# Patient Record
Sex: Male | Born: 1960 | Race: White | Hispanic: No | Marital: Single | Smoking: Current every day smoker
Health system: Southern US, Community
[De-identification: ages and names within clinical notes are randomized; demographics above are authoritative.]

## PROBLEM LIST (undated history)

## (undated) DIAGNOSIS — I10 Essential (primary) hypertension: Secondary | ICD-10-CM

## (undated) DIAGNOSIS — M542 Cervicalgia: Secondary | ICD-10-CM

## (undated) DIAGNOSIS — F419 Anxiety disorder, unspecified: Secondary | ICD-10-CM

## (undated) DIAGNOSIS — M1990 Unspecified osteoarthritis, unspecified site: Secondary | ICD-10-CM

## (undated) DIAGNOSIS — B1920 Unspecified viral hepatitis C without hepatic coma: Secondary | ICD-10-CM

## (undated) DIAGNOSIS — R0600 Dyspnea, unspecified: Secondary | ICD-10-CM

## (undated) DIAGNOSIS — F32A Depression, unspecified: Secondary | ICD-10-CM

## (undated) DIAGNOSIS — E7800 Pure hypercholesterolemia, unspecified: Secondary | ICD-10-CM

## (undated) DIAGNOSIS — M25519 Pain in unspecified shoulder: Secondary | ICD-10-CM

## (undated) DIAGNOSIS — Y92009 Unspecified place in unspecified non-institutional (private) residence as the place of occurrence of the external cause: Secondary | ICD-10-CM

## (undated) DIAGNOSIS — K219 Gastro-esophageal reflux disease without esophagitis: Secondary | ICD-10-CM

## (undated) DIAGNOSIS — Z889 Allergy status to unspecified drugs, medicaments and biological substances status: Secondary | ICD-10-CM

## (undated) DIAGNOSIS — G894 Chronic pain syndrome: Secondary | ICD-10-CM

## (undated) DIAGNOSIS — F329 Major depressive disorder, single episode, unspecified: Secondary | ICD-10-CM

## (undated) DIAGNOSIS — I2510 Atherosclerotic heart disease of native coronary artery without angina pectoris: Secondary | ICD-10-CM

## (undated) DIAGNOSIS — R296 Repeated falls: Secondary | ICD-10-CM

## (undated) DIAGNOSIS — M549 Dorsalgia, unspecified: Secondary | ICD-10-CM

## (undated) DIAGNOSIS — Z87442 Personal history of urinary calculi: Secondary | ICD-10-CM

## (undated) DIAGNOSIS — F4310 Post-traumatic stress disorder, unspecified: Secondary | ICD-10-CM

## (undated) DIAGNOSIS — J449 Chronic obstructive pulmonary disease, unspecified: Secondary | ICD-10-CM

## (undated) DIAGNOSIS — G8929 Other chronic pain: Secondary | ICD-10-CM

## (undated) DIAGNOSIS — F319 Bipolar disorder, unspecified: Secondary | ICD-10-CM

## (undated) DIAGNOSIS — W19XXXA Unspecified fall, initial encounter: Secondary | ICD-10-CM

## (undated) HISTORY — DX: Chronic obstructive pulmonary disease, unspecified: J44.9

## (undated) HISTORY — PX: HAND SURGERY: SHX662

## (undated) HISTORY — PX: OTHER SURGICAL HISTORY: SHX169

## (undated) HISTORY — DX: Unspecified viral hepatitis C without hepatic coma: B19.20

## (undated) HISTORY — PX: KNEE SURGERY: SHX244

## (undated) HISTORY — PX: SHOULDER SURGERY: SHX246

## (undated) HISTORY — PX: TONSILLECTOMY: SUR1361

## (undated) HISTORY — PX: THROAT SURGERY: SHX803

---

## 1998-07-14 ENCOUNTER — Encounter: Payer: Self-pay | Admitting: Emergency Medicine

## 1998-07-15 ENCOUNTER — Encounter: Payer: Self-pay | Admitting: Orthopedic Surgery

## 1998-07-15 ENCOUNTER — Encounter: Payer: Self-pay | Admitting: Emergency Medicine

## 1998-07-15 ENCOUNTER — Inpatient Hospital Stay (HOSPITAL_COMMUNITY): Admission: EM | Admit: 1998-07-15 | Discharge: 1998-07-18 | Payer: Self-pay | Admitting: Emergency Medicine

## 1998-07-24 ENCOUNTER — Encounter: Payer: Self-pay | Admitting: Emergency Medicine

## 1998-07-24 ENCOUNTER — Emergency Department (HOSPITAL_COMMUNITY): Admission: EM | Admit: 1998-07-24 | Discharge: 1998-07-24 | Payer: Self-pay | Admitting: Emergency Medicine

## 2006-09-27 ENCOUNTER — Ambulatory Visit: Payer: Self-pay | Admitting: Internal Medicine

## 2006-09-28 ENCOUNTER — Ambulatory Visit (HOSPITAL_COMMUNITY): Admission: RE | Admit: 2006-09-28 | Discharge: 2006-09-28 | Payer: Self-pay | Admitting: Internal Medicine

## 2009-12-14 ENCOUNTER — Emergency Department (HOSPITAL_COMMUNITY): Admission: EM | Admit: 2009-12-14 | Discharge: 2009-12-14 | Payer: Self-pay | Admitting: Emergency Medicine

## 2010-06-05 LAB — POCT I-STAT, CHEM 8
Chloride: 109 mEq/L (ref 96–112)
Creatinine, Ser: 1.3 mg/dL (ref 0.4–1.5)
Glucose, Bld: 124 mg/dL — ABNORMAL HIGH (ref 70–99)
Potassium: 3.6 mEq/L (ref 3.5–5.1)

## 2010-06-05 LAB — TYPE AND SCREEN: ABO/RH(D): O POS

## 2010-06-05 LAB — CBC
HCT: 46.3 % (ref 39.0–52.0)
MCHC: 36.1 g/dL — ABNORMAL HIGH (ref 30.0–36.0)
Platelets: 213 10*3/uL (ref 150–400)
RDW: 12.3 % (ref 11.5–15.5)
WBC: 13.3 10*3/uL — ABNORMAL HIGH (ref 4.0–10.5)

## 2010-06-05 LAB — COMPREHENSIVE METABOLIC PANEL
ALT: 75 U/L — ABNORMAL HIGH (ref 0–53)
Albumin: 4 g/dL (ref 3.5–5.2)
Calcium: 9.1 mg/dL (ref 8.4–10.5)
GFR calc Af Amer: >60 mL/min (ref >60)
Glucose, Bld: 121 mg/dL — ABNORMAL HIGH (ref 70–99)
Sodium: 140 mEq/L (ref 135–145)
Total Protein: 7.4 g/dL (ref 6.0–8.3)

## 2010-06-05 LAB — PROTIME-INR: Prothrombin Time: 12.2 seconds (ref 11.6–15.2)

## 2010-06-05 LAB — LACTIC ACID, PLASMA: Lactic Acid, Venous: 2.1 mmol/L (ref 0.5–2.2)

## 2010-06-05 LAB — ETHANOL: Alcohol, Ethyl (B): 201 mg/dL — ABNORMAL HIGH (ref 0–10)

## 2012-08-03 ENCOUNTER — Encounter (HOSPITAL_COMMUNITY): Payer: Self-pay

## 2012-08-03 ENCOUNTER — Emergency Department (HOSPITAL_COMMUNITY)
Admission: EM | Admit: 2012-08-03 | Discharge: 2012-08-03 | Disposition: A | Discharge status: Home / Self Care | Payer: Self-pay | Attending: Emergency Medicine | Admitting: Emergency Medicine

## 2012-08-03 ENCOUNTER — Emergency Department (HOSPITAL_COMMUNITY): Payer: Self-pay

## 2012-08-03 DIAGNOSIS — K769 Liver disease, unspecified: Secondary | ICD-10-CM | POA: Insufficient documentation

## 2012-08-03 DIAGNOSIS — R6883 Chills (without fever): Secondary | ICD-10-CM | POA: Insufficient documentation

## 2012-08-03 DIAGNOSIS — IMO0001 Reserved for inherently not codable concepts without codable children: Secondary | ICD-10-CM | POA: Insufficient documentation

## 2012-08-03 DIAGNOSIS — K5289 Other specified noninfective gastroenteritis and colitis: Secondary | ICD-10-CM | POA: Insufficient documentation

## 2012-08-03 DIAGNOSIS — R51 Headache: Secondary | ICD-10-CM | POA: Insufficient documentation

## 2012-08-03 DIAGNOSIS — F17200 Nicotine dependence, unspecified, uncomplicated: Secondary | ICD-10-CM | POA: Insufficient documentation

## 2012-08-03 DIAGNOSIS — I2510 Atherosclerotic heart disease of native coronary artery without angina pectoris: Secondary | ICD-10-CM | POA: Insufficient documentation

## 2012-08-03 DIAGNOSIS — K529 Noninfective gastroenteritis and colitis, unspecified: Secondary | ICD-10-CM

## 2012-08-03 DIAGNOSIS — Z8659 Personal history of other mental and behavioral disorders: Secondary | ICD-10-CM | POA: Insufficient documentation

## 2012-08-03 DIAGNOSIS — R109 Unspecified abdominal pain: Secondary | ICD-10-CM | POA: Insufficient documentation

## 2012-08-03 DIAGNOSIS — R1110 Vomiting, unspecified: Secondary | ICD-10-CM | POA: Insufficient documentation

## 2012-08-03 HISTORY — DX: Post-traumatic stress disorder, unspecified: F43.10

## 2012-08-03 LAB — BASIC METABOLIC PANEL
BUN: 12 mg/dL (ref 6–23)
Chloride: 98 mEq/L (ref 96–112)
Creatinine, Ser: 0.9 mg/dL (ref 0.50–1.35)
GFR calc Af Amer: >90 mL/min (ref >90)
GFR calc non Af Amer: >90 mL/min (ref >90)

## 2012-08-03 LAB — CBC WITH DIFFERENTIAL/PLATELET
Basophils Relative: 0 % (ref 0–1)
Eosinophils Absolute: 0.1 10*3/uL (ref 0.0–0.7)
HCT: 49.7 % (ref 39.0–52.0)
Hemoglobin: 18.7 g/dL — ABNORMAL HIGH (ref 13.0–17.0)
MCH: 33.6 pg (ref 26.0–34.0)
MCHC: 37.6 g/dL — ABNORMAL HIGH (ref 30.0–36.0)
MCV: 89.2 fL (ref 78.0–100.0)
Monocytes Absolute: 0.8 10*3/uL (ref 0.1–1.0)
Monocytes Relative: 6 % (ref 3–12)

## 2012-08-03 LAB — URINALYSIS, ROUTINE W REFLEX MICROSCOPIC
Bilirubin Urine: NEGATIVE
Ketones, ur: NEGATIVE mg/dL
Nitrite: NEGATIVE
pH: 6 (ref 5.0–8.0)

## 2012-08-03 LAB — URINE MICROSCOPIC-ADD ON

## 2012-08-03 LAB — HEPATIC FUNCTION PANEL
AST: 53 U/L — ABNORMAL HIGH (ref 0–37)
Bilirubin, Direct: 0.2 mg/dL (ref 0.0–0.3)
Total Bilirubin: 0.8 mg/dL (ref 0.3–1.2)

## 2012-08-03 LAB — LIPASE, BLOOD: Lipase: 34 U/L (ref 11–59)

## 2012-08-03 MED ORDER — SODIUM CHLORIDE 0.9 % IV SOLN
1000.0000 mL | INTRAVENOUS | Status: DC
  Administered 2012-08-03: 1000 mL via INTRAVENOUS

## 2012-08-03 MED ORDER — PROMETHAZINE HCL 25 MG RE SUPP: 25.0000 mg | Freq: Four times a day (QID) | RECTAL | Status: DC | PRN

## 2012-08-03 MED ORDER — HYDROMORPHONE HCL PF 1 MG/ML IJ SOLN
0.5000 mg | Freq: Once | INTRAMUSCULAR | Status: AC
  Administered 2012-08-03: 0.5 mg via INTRAVENOUS
  Filled 2012-08-03: qty 1

## 2012-08-03 MED ORDER — SODIUM CHLORIDE 0.9 % IV SOLN
1000.0000 mL | Freq: Once | INTRAVENOUS | Status: AC
  Administered 2012-08-03: 1000 mL via INTRAVENOUS

## 2012-08-03 MED ORDER — IOHEXOL 300 MG/ML  SOLN
100.0000 mL | Freq: Once | INTRAMUSCULAR | Status: AC | PRN
  Administered 2012-08-03: 100 mL via INTRAVENOUS

## 2012-08-03 MED ORDER — IOHEXOL 300 MG/ML  SOLN
50.0000 mL | Freq: Once | INTRAMUSCULAR | Status: AC | PRN
  Administered 2012-08-03: 50 mL via ORAL

## 2012-08-03 MED ORDER — HYDROCODONE-ACETAMINOPHEN 5-325 MG PO TABS: ORAL_TABLET | ORAL | Status: DC

## 2012-08-03 MED ORDER — ONDANSETRON HCL 4 MG/2ML IJ SOLN
4.0000 mg | Freq: Once | INTRAMUSCULAR | Status: AC
  Administered 2012-08-03: 4 mg via INTRAVENOUS
  Filled 2012-08-03: qty 2

## 2012-08-03 NOTE — ED Notes (Signed)
Complain of " puking " since Monday. States he started getting sick about 0430 in the morning on Monday. States he feels some better today but is still aching and vomiting. States he had a fever earlier but not take anything for it because he does not keep stuff like that in the house.

## 2012-08-03 NOTE — ED Notes (Signed)
Pt c/o vomiting since Monday.  Denies diarrhea.  LBM was this am.   Also reports fever, generalized bodyaches, and weakness.    Reports hasn't taken anything for fever today.

## 2012-08-03 NOTE — ED Notes (Signed)
EDPA in with pt 
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
## 2012-08-06 NOTE — ED Provider Notes (Signed)
Medical screening examination/treatment/procedure(s) were performed by non-physician practitioner and as supervising physician I was immediately available for consultation/collaboration.   Maricella Filyaw M Lauralynn Loeb, DO 08/06/12 1059 

## 2012-10-19 ENCOUNTER — Emergency Department (HOSPITAL_COMMUNITY): Payer: Self-pay

## 2012-10-19 ENCOUNTER — Encounter (HOSPITAL_COMMUNITY): Payer: Self-pay | Admitting: Emergency Medicine

## 2012-10-19 ENCOUNTER — Emergency Department (HOSPITAL_COMMUNITY)
Admission: EM | Admit: 2012-10-19 | Discharge: 2012-10-19 | Disposition: A | Discharge status: Home / Self Care | Payer: Self-pay | Attending: Emergency Medicine | Admitting: Emergency Medicine

## 2012-10-19 DIAGNOSIS — I2510 Atherosclerotic heart disease of native coronary artery without angina pectoris: Secondary | ICD-10-CM | POA: Insufficient documentation

## 2012-10-19 DIAGNOSIS — F17200 Nicotine dependence, unspecified, uncomplicated: Secondary | ICD-10-CM | POA: Insufficient documentation

## 2012-10-19 DIAGNOSIS — Z8659 Personal history of other mental and behavioral disorders: Secondary | ICD-10-CM | POA: Insufficient documentation

## 2012-10-19 DIAGNOSIS — Z8639 Personal history of other endocrine, nutritional and metabolic disease: Secondary | ICD-10-CM | POA: Insufficient documentation

## 2012-10-19 DIAGNOSIS — R112 Nausea with vomiting, unspecified: Secondary | ICD-10-CM | POA: Insufficient documentation

## 2012-10-19 DIAGNOSIS — Z880 Allergy status to penicillin: Secondary | ICD-10-CM | POA: Insufficient documentation

## 2012-10-19 DIAGNOSIS — R109 Unspecified abdominal pain: Secondary | ICD-10-CM | POA: Insufficient documentation

## 2012-10-19 DIAGNOSIS — Z862 Personal history of diseases of the blood and blood-forming organs and certain disorders involving the immune mechanism: Secondary | ICD-10-CM | POA: Insufficient documentation

## 2012-10-19 HISTORY — DX: Pure hypercholesterolemia, unspecified: E78.00

## 2012-10-19 HISTORY — DX: Atherosclerotic heart disease of native coronary artery without angina pectoris: I25.10

## 2012-10-19 LAB — CBC WITH DIFFERENTIAL/PLATELET
Basophils Absolute: 0 10*3/uL (ref 0.0–0.1)
Basophils Relative: 0 % (ref 0–1)
Eosinophils Absolute: 0.3 10*3/uL (ref 0.0–0.7)
Eosinophils Relative: 3 % (ref 0–5)
Lymphs Abs: 2.1 10*3/uL (ref 0.7–4.0)
MCH: 33.8 pg (ref 26.0–34.0)
MCHC: 36.8 g/dL — ABNORMAL HIGH (ref 30.0–36.0)
MCV: 91.9 fL (ref 78.0–100.0)
Neutrophils Relative %: 74 % (ref 43–77)
Platelets: 185 10*3/uL (ref 150–400)
RDW: 12.2 % (ref 11.5–15.5)

## 2012-10-19 LAB — COMPREHENSIVE METABOLIC PANEL
ALT: 35 U/L (ref 0–53)
Albumin: 3.9 g/dL (ref 3.5–5.2)
Calcium: 9.4 mg/dL (ref 8.4–10.5)
GFR calc Af Amer: >90 mL/min (ref >90)
Glucose, Bld: 124 mg/dL — ABNORMAL HIGH (ref 70–99)
Sodium: 134 mEq/L — ABNORMAL LOW (ref 135–145)
Total Protein: 7.9 g/dL (ref 6.0–8.3)

## 2012-10-19 LAB — URINALYSIS, ROUTINE W REFLEX MICROSCOPIC
Bilirubin Urine: NEGATIVE
Ketones, ur: NEGATIVE mg/dL
Nitrite: NEGATIVE
Protein, ur: NEGATIVE mg/dL
Urobilinogen, UA: 0.2 mg/dL (ref 0.0–1.0)

## 2012-10-19 LAB — URINE MICROSCOPIC-ADD ON

## 2012-10-19 LAB — PROTIME-INR: INR: 0.98 (ref 0.00–1.49)

## 2012-10-19 LAB — APTT: aPTT: 28 seconds (ref 24–37)

## 2012-10-19 MED ORDER — PROMETHAZINE HCL 25 MG RE SUPP: 25.0000 mg | Freq: Four times a day (QID) | RECTAL | Status: DC | PRN

## 2012-10-19 MED ORDER — MORPHINE SULFATE 4 MG/ML IJ SOLN
4.0000 mg | INTRAMUSCULAR | Status: DC | PRN
  Administered 2012-10-19: 4 mg via INTRAVENOUS
  Filled 2012-10-19: qty 1

## 2012-10-19 MED ORDER — HYDROMORPHONE HCL PF 1 MG/ML IJ SOLN: 1.0000 mg | Freq: Once | INTRAMUSCULAR | Status: DC

## 2012-10-19 MED ORDER — IOHEXOL 300 MG/ML  SOLN
100.0000 mL | Freq: Once | INTRAMUSCULAR | Status: AC | PRN
  Administered 2012-10-19: 100 mL via INTRAVENOUS

## 2012-10-19 MED ORDER — IOHEXOL 300 MG/ML  SOLN: 50.0000 mL | Freq: Once | INTRAMUSCULAR | Status: DC | PRN

## 2012-10-19 MED ORDER — HYDROCODONE-ACETAMINOPHEN 5-325 MG PO TABS: 1.0000 | ORAL_TABLET | Freq: Four times a day (QID) | ORAL | Status: DC | PRN

## 2012-10-19 MED ORDER — ONDANSETRON 8 MG PO TBDP: 8.0000 mg | ORAL_TABLET | Freq: Three times a day (TID) | ORAL | Status: DC | PRN

## 2012-10-19 MED ORDER — SODIUM CHLORIDE 0.9 % IV BOLUS (SEPSIS)
1000.0000 mL | Freq: Once | INTRAVENOUS | Status: AC
  Administered 2012-10-19: 1000 mL via INTRAVENOUS

## 2012-10-19 MED ORDER — ONDANSETRON HCL 4 MG/2ML IJ SOLN
4.0000 mg | Freq: Four times a day (QID) | INTRAMUSCULAR | Status: DC | PRN
  Administered 2012-10-19: 4 mg via INTRAVENOUS
  Filled 2012-10-19: qty 2

## 2012-10-19 MED ORDER — IOHEXOL 300 MG/ML  SOLN
50.0000 mL | Freq: Once | INTRAMUSCULAR | Status: AC | PRN
  Administered 2012-10-19: 50 mL via ORAL

## 2012-10-19 MED ORDER — MORPHINE SULFATE 4 MG/ML IJ SOLN
4.0000 mg | Freq: Once | INTRAMUSCULAR | Status: AC
  Administered 2012-10-19: 4 mg via INTRAVENOUS
  Filled 2012-10-19: qty 1

## 2012-10-19 MED ORDER — HYDROMORPHONE HCL PF 1 MG/ML IJ SOLN
1.0000 mg | INTRAMUSCULAR | Status: DC | PRN
  Administered 2012-10-19: 1 mg via INTRAVENOUS
  Filled 2012-10-19: qty 1
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
## 2012-10-19 NOTE — ED Notes (Signed)
Pt called stating he cannot afford Zofran rx nor Phenergan suppository rx.  Per EDP - Rx for Phenergan 25 mg PO, 1 tab every 6 hrs PRN nausea, quantity 20 called in to [facility] in [place].  Pt called back and notified of rx change.

## 2012-10-19 NOTE — ED Notes (Signed)
Pt c/o n/v and left lower abd pain x 10 days. Denies diarrhea. Vomited approx 6 times in past 24 hrs. Pt states nausea worse in am. Intermittent pains to ruq. Dizziness with movement x8 days ago. Mm slightly moist. lnbm yesterday. Denies black or bloody emesis/stool.

## 2012-11-14 ENCOUNTER — Ambulatory Visit: Payer: Self-pay

## 2012-11-14 ENCOUNTER — Ambulatory Visit: Payer: Self-pay | Attending: Family Medicine | Admitting: Internal Medicine

## 2012-11-14 VITALS — BP 166/106 | HR 72 | Temp 97.7°F | Resp 16 | Wt 255.0 lb

## 2012-11-14 DIAGNOSIS — M25519 Pain in unspecified shoulder: Secondary | ICD-10-CM | POA: Insufficient documentation

## 2012-11-14 DIAGNOSIS — G894 Chronic pain syndrome: Secondary | ICD-10-CM

## 2012-11-14 DIAGNOSIS — M549 Dorsalgia, unspecified: Secondary | ICD-10-CM | POA: Insufficient documentation

## 2012-11-14 MED ORDER — PREDNISONE 20 MG PO TABS: 40.0000 mg | ORAL_TABLET | Freq: Every day | ORAL | Status: DC

## 2012-11-14 MED ORDER — METHOCARBAMOL 500 MG PO TABS: 500.0000 mg | ORAL_TABLET | Freq: Three times a day (TID) | ORAL | Status: DC
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
## 2012-11-14 NOTE — Progress Notes (Signed)
Patient here to establish care Suffers from ptsd

## 2012-11-15 ENCOUNTER — Ambulatory Visit (HOSPITAL_COMMUNITY)
Admission: RE | Admit: 2012-11-15 | Discharge: 2012-11-15 | Disposition: A | Discharge status: Home / Self Care | Payer: Self-pay | Source: Ambulatory Visit | Attending: Internal Medicine | Admitting: Internal Medicine

## 2012-11-15 DIAGNOSIS — G894 Chronic pain syndrome: Secondary | ICD-10-CM

## 2012-11-15 DIAGNOSIS — M545 Low back pain, unspecified: Secondary | ICD-10-CM | POA: Insufficient documentation

## 2012-11-15 DIAGNOSIS — M25519 Pain in unspecified shoulder: Secondary | ICD-10-CM | POA: Insufficient documentation

## 2012-11-15 DIAGNOSIS — M79609 Pain in unspecified limb: Secondary | ICD-10-CM | POA: Insufficient documentation

## 2012-12-12 ENCOUNTER — Telehealth: Payer: Self-pay | Admitting: Family Medicine

## 2012-12-12 NOTE — Telephone Encounter (Signed)
Pt calling about results for x-ray done on 11/15/12.

## 2012-12-15 ENCOUNTER — Telehealth: Payer: Self-pay | Admitting: Emergency Medicine

## 2012-12-15 NOTE — Telephone Encounter (Signed)
Spoke with pt regarding xray results. Scheduled f/u appt to discuss pain management 12/30/12 @ 12 pm

## 2012-12-30 ENCOUNTER — Ambulatory Visit: Payer: Self-pay | Attending: Internal Medicine | Admitting: Internal Medicine

## 2012-12-30 ENCOUNTER — Encounter: Payer: Self-pay | Admitting: Internal Medicine

## 2012-12-30 VITALS — BP 130/76 | HR 89 | Temp 97.9°F | Resp 18 | Wt 266.0 lb

## 2012-12-30 DIAGNOSIS — R52 Pain, unspecified: Secondary | ICD-10-CM | POA: Insufficient documentation

## 2012-12-30 DIAGNOSIS — G894 Chronic pain syndrome: Secondary | ICD-10-CM

## 2012-12-30 MED ORDER — TRAMADOL HCL 50 MG PO TABS: 50.0000 mg | ORAL_TABLET | Freq: Three times a day (TID) | ORAL | Status: DC | PRN

## 2012-12-30 NOTE — Patient Instructions (Signed)
Osteoarthritis Osteoarthritis is the most common form of arthritis. It is redness, soreness, and swelling (inflammation) affecting the cartilage. Cartilage acts as a cushion, covering the ends of bones where they meet to form a joint. CAUSES  Over time, the cartilage begins to wear away. This causes bone to rub on bone. This produces pain and stiffness in the affected joints. Factors that contribute to this problem are:  Excessive body weight.  Age.  Overuse of joints. SYMPTOMS   People with osteoarthritis usually experience joint pain, swelling, or stiffness.  Over time, the joint may lose its normal shape.  Small deposits of bone (osteophytes) may grow on the edges of the joint.  Bits of bone or cartilage can break off and float inside the joint space. This may cause more pain and damage.  Osteoarthritis can lead to depression, anxiety, feelings of helplessness, and limitations on daily activities. The most commonly affected joints are in the:  Ends of the fingers.  Thumbs.  Neck.  Lower back.  Knees.  Hips. DIAGNOSIS  Diagnosis is mostly based on your symptoms and exam. Tests may be helpful, including:  X-rays of the affected joint.  A computerized magnetic scan (MRI).  Blood tests to rule out other types of arthritis.  Joint fluid tests. This involves using a needle to draw fluid from the joint and examining the fluid under a microscope. TREATMENT  Goals of treatment are to control pain, improve joint function, maintain a normal body weight, and maintain a healthy lifestyle. Treatment approaches may include:  A prescribed exercise program with rest and joint relief.  Weight control with nutritional education.  Pain relief techniques such as:  Properly applied heat and cold.  Electric pulses delivered to nerve endings under the skin (transcutaneous electrical nerve stimulation, TENS).  Massage.  Certain supplements. Ask your caregiver before using any  supplements, especially in combination with prescribed drugs.  Medicines to control pain, such as:  Acetaminophen.  Nonsteroidal anti-inflammatory drugs (NSAIDs), such as naproxen.  Narcotic or central-acting agents, such as tramadol. This drug carries a risk of addiction and is generally prescribed for short-term use.  Corticosteroids. These can be given orally or as injection. This is a short-term treatment, not recommended for routine use.  Surgery to reposition the bones and relieve pain (osteotomy) or to remove loose pieces of bone and cartilage. Joint replacement may be needed in advanced states of osteoarthritis. HOME CARE INSTRUCTIONS  Your caregiver can recommend specific types of exercise. These may include:  Strengthening exercises. These are done to strengthen the muscles that support joints affected by arthritis. They can be performed with weights or with exercise bands to add resistance.  Aerobic activities. These are exercises, such as brisk walking or low-impact aerobics, that get your heart pumping. They can help keep your lungs and circulatory system in shape.  Range-of-motion activities. These keep your joints limber.  Balance and agility exercises. These help you maintain daily living skills. Learning about your condition and being actively involved in your care will help improve the course of your osteoarthritis. SEEK MEDICAL CARE IF:   You feel hot or your skin turns red.  You develop a rash in addition to your joint pain.  You have an oral temperature above 102 F (38.9 C). FOR MORE INFORMATION  National Institute of Arthritis and Musculoskeletal and Skin Diseases: www.niams.nih.gov National Institute on Aging: www.nia.nih.gov American College of Rheumatology: www.rheumatology.org Document Released: 03/09/2005 Document Revised: 06/01/2011 Document Reviewed: 06/20/2009 [employer] Patient Information 2014 [employer].  

## 2012-12-30 NOTE — Progress Notes (Signed)
Pt is here for a f/u and needing recently x-ray results Reports he is in constant pain that increases w/activity Voices no new concerns... Alert w/no signs of acute distress.
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
## 2013-01-06 ENCOUNTER — Telehealth: Payer: Self-pay | Admitting: Emergency Medicine

## 2013-01-06 NOTE — Telephone Encounter (Signed)
Spoke with pt sister regarding denied pain clinic. Gave pt number for [facility] pain clinic

## 2013-02-07 DIAGNOSIS — M19019 Primary osteoarthritis, unspecified shoulder: Secondary | ICD-10-CM | POA: Insufficient documentation

## 2013-02-07 DIAGNOSIS — M1990 Unspecified osteoarthritis, unspecified site: Secondary | ICD-10-CM | POA: Insufficient documentation

## 2013-02-09 ENCOUNTER — Encounter (INDEPENDENT_AMBULATORY_CARE_PROVIDER_SITE_OTHER): Payer: Self-pay

## 2013-02-09 ENCOUNTER — Encounter (HOSPITAL_COMMUNITY): Payer: Self-pay | Admitting: Psychiatry

## 2013-02-09 ENCOUNTER — Ambulatory Visit (INDEPENDENT_AMBULATORY_CARE_PROVIDER_SITE_OTHER): Payer: Self-pay | Admitting: Psychiatry

## 2013-02-09 VITALS — BP 137/91 | HR 105 | Ht 70.5 in | Wt 284.0 lb

## 2013-02-09 DIAGNOSIS — F3113 Bipolar disorder, current episode manic without psychotic features, severe: Secondary | ICD-10-CM

## 2013-02-09 MED ORDER — LAMOTRIGINE 25 MG PO TABS: ORAL_TABLET | ORAL | Status: DC

## 2013-02-09 MED ORDER — LAMOTRIGINE 25 MG PO TABS: 25.0000 mg | ORAL_TABLET | Freq: Every day | ORAL | Status: DC
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
## 2013-02-13 ENCOUNTER — Ambulatory Visit: Payer: Self-pay | Attending: Internal Medicine | Admitting: Internal Medicine

## 2013-02-13 ENCOUNTER — Telehealth (HOSPITAL_COMMUNITY): Payer: Self-pay

## 2013-02-13 VITALS — BP 159/96 | HR 89 | Temp 98.6°F | Ht 71.0 in | Wt 274.8 lb

## 2013-02-13 DIAGNOSIS — G894 Chronic pain syndrome: Secondary | ICD-10-CM | POA: Insufficient documentation

## 2013-02-13 DIAGNOSIS — F3113 Bipolar disorder, current episode manic without psychotic features, severe: Secondary | ICD-10-CM | POA: Insufficient documentation

## 2013-02-13 DIAGNOSIS — E785 Hyperlipidemia, unspecified: Secondary | ICD-10-CM | POA: Insufficient documentation

## 2013-02-13 DIAGNOSIS — Z23 Encounter for immunization: Secondary | ICD-10-CM

## 2013-02-13 LAB — COMPLETE METABOLIC PANEL WITH GFR
ALT: 93 U/L — ABNORMAL HIGH (ref 0–53)
Albumin: 4.7 g/dL (ref 3.5–5.2)
Alkaline Phosphatase: 78 U/L (ref 39–117)
CO2: 24 mEq/L (ref 19–32)
Calcium: 9.4 mg/dL (ref 8.4–10.5)
Chloride: 101 mEq/L (ref 96–112)
GFR, Est African American: >89 mL/min
GFR, Est Non African American: >89 mL/min
Glucose, Bld: 91 mg/dL (ref 70–99)
Potassium: 4.1 mEq/L (ref 3.5–5.3)
Sodium: 139 mEq/L (ref 135–145)
Total Bilirubin: 0.6 mg/dL (ref 0.3–1.2)
Total Protein: 8.2 g/dL (ref 6.0–8.3)

## 2013-02-13 LAB — SEDIMENTATION RATE: Sed Rate: 5 mm/hr (ref 0–16)

## 2013-02-13 LAB — LIPID PANEL
HDL: 36 mg/dL — ABNORMAL LOW (ref >39)
LDL Cholesterol: 95 mg/dL (ref 0–99)
Total CHOL/HDL Ratio: 4.7 Ratio
Triglycerides: 189 mg/dL — ABNORMAL HIGH (ref <150)

## 2013-02-13 MED ORDER — TRAMADOL HCL 50 MG PO TABS: 50.0000 mg | ORAL_TABLET | Freq: Three times a day (TID) | ORAL | Status: DC | PRN

## 2013-02-13 MED ORDER — LAMOTRIGINE 25 MG PO TABS: ORAL_TABLET | ORAL | Status: DC

## 2013-02-13 NOTE — Telephone Encounter (Signed)
Provided prescription to be faxed to front office staff.

## 2013-02-13 NOTE — Progress Notes (Signed)
  Subjective:    Patient ID: Oscar Hall, male    DOB: 1960/11/25, 52 y.o.   MRN: [id number]  HPI    Review of Systems     Objective:   Physical Exam        Assessment & Plan:

## 2013-02-13 NOTE — Progress Notes (Signed)
Pt is here for medication refill and a referral for a rheumatologist from a pain clinic.
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
## 2013-02-14 LAB — ANA: Anti Nuclear Antibody(ANA): NEGATIVE

## 2013-02-27 ENCOUNTER — Telehealth (HOSPITAL_COMMUNITY): Payer: Self-pay

## 2013-02-27 NOTE — Telephone Encounter (Signed)
Called patient. Asked him to increase lamictal to 50 mg.

## 2013-03-03 ENCOUNTER — Ambulatory Visit: Payer: Self-pay | Admitting: Family Medicine

## 2013-03-07 ENCOUNTER — Telehealth (HOSPITAL_COMMUNITY): Payer: Self-pay

## 2013-03-10 NOTE — Telephone Encounter (Signed)
First attempt to call patient. Left message that I will call again after 5 PM.

## 2013-03-10 NOTE — Telephone Encounter (Signed)
Second attempt to call patient. No answer, left message.

## 2013-03-10 NOTE — Telephone Encounter (Signed)
Called patient. No reported SI/HI/AVH. He is currently on 50 mg of Lamictal with no side effects. Will increase Lamictal to 75 mg daily.

## 2013-03-20 ENCOUNTER — Ambulatory Visit (INDEPENDENT_AMBULATORY_CARE_PROVIDER_SITE_OTHER): Payer: Self-pay | Admitting: Psychiatry

## 2013-03-20 ENCOUNTER — Encounter (HOSPITAL_COMMUNITY): Payer: Self-pay | Admitting: Psychiatry

## 2013-03-20 VITALS — BP 147/90 | HR 92 | Ht 71.0 in | Wt 282.0 lb

## 2013-03-20 DIAGNOSIS — F3113 Bipolar disorder, current episode manic without psychotic features, severe: Secondary | ICD-10-CM

## 2013-03-20 MED ORDER — HYDROXYZINE PAMOATE 25 MG PO CAPS: 25.0000 mg | ORAL_CAPSULE | Freq: Three times a day (TID) | ORAL | Status: DC | PRN

## 2013-03-20 MED ORDER — LAMOTRIGINE 25 MG PO TABS: 25.0000 mg | ORAL_TABLET | Freq: Every day | ORAL | Status: DC

## 2013-03-20 MED ORDER — LAMOTRIGINE 100 MG PO TABS: 100.0000 mg | ORAL_TABLET | Freq: Every day | ORAL | Status: DC
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
## 2013-04-03 ENCOUNTER — Telehealth (HOSPITAL_COMMUNITY): Payer: Self-pay | Admitting: Psychiatry

## 2013-04-03 NOTE — Telephone Encounter (Signed)
Patient reports that he has had a rash on his arms last Friday which was shortly after the increase of lamictal to 125 mg.    I asked the patient to go to the ER to have the rash examined to make sure it was not a Stevens-Johnson type rash and if so will stop Lamictal and try another medication.

## 2013-04-17 ENCOUNTER — Encounter: Payer: Self-pay | Admitting: Internal Medicine

## 2013-04-17 ENCOUNTER — Ambulatory Visit: Payer: Self-pay | Attending: Internal Medicine | Admitting: Internal Medicine

## 2013-04-17 VITALS — BP 159/96 | HR 94 | Temp 97.7°F | Resp 16 | Ht 71.0 in | Wt 281.0 lb

## 2013-04-17 DIAGNOSIS — G894 Chronic pain syndrome: Secondary | ICD-10-CM

## 2013-04-17 DIAGNOSIS — I10 Essential (primary) hypertension: Secondary | ICD-10-CM

## 2013-04-17 DIAGNOSIS — R7302 Impaired glucose tolerance (oral): Secondary | ICD-10-CM

## 2013-04-17 DIAGNOSIS — E781 Pure hyperglyceridemia: Secondary | ICD-10-CM

## 2013-04-17 MED ORDER — TRAMADOL HCL 50 MG PO TABS: 100.0000 mg | ORAL_TABLET | Freq: Four times a day (QID) | ORAL | Status: DC | PRN

## 2013-04-17 MED ORDER — HYDROCHLOROTHIAZIDE 25 MG PO TABS: 12.5000 mg | ORAL_TABLET | Freq: Every day | ORAL | Status: DC

## 2013-04-17 MED ORDER — EZETIMIBE-SIMVASTATIN 10-20 MG PO TABS: 1.0000 | ORAL_TABLET | Freq: Every day | ORAL | Status: DC

## 2013-04-17 NOTE — Patient Instructions (Signed)

## 2013-04-17 NOTE — Progress Notes (Unsigned)
Patient ID: Oscar Hall, male   DOB: 1961-02-16, 53 y.o.   MRN: [id number]   HPI: This is a 53 year old male who presents for followup. He continues to complain of pain in his left neck and shoulder which is not relieved with tramadol. He is unable to take NSAIDs due to resultant epigastric pain and heartburn. He continues to smoke and is currently not motivated to quit as he lives with his sister who smokes as well.     Medication List       This list is accurate as of: 04/17/13  1:29 PM.  Always use your most recent med list.               ezetimibe-simvastatin 10-20 MG per tablet  Commonly known as:  VYTORIN  Take 1 tablet by mouth daily.     hydrochlorothiazide 25 MG tablet  Commonly known as:  HYDRODIURIL  Take 0.5 tablets (12.5 mg total) by mouth daily.     traMADol 50 MG tablet  Commonly known as:  ULTRAM  Take 2 tablets (100 mg total) by mouth every 6 (six) hours as needed.         Allergies  Allergen Reactions  . Celebrex [Celecoxib]   . Neurontin [Gabapentin]   . Penicillins     Blisters on tongue  . Soma [Carisoprodol]   . Sulfa Antibiotics   . Toradol [Ketorolac Tromethamine]    Past Medical History  Diagnosis Date  . PTSD (post-traumatic stress disorder)   . Coronary artery disease   . Hypercholesterolemia   . Fatty liver     Family History  Problem Relation Age of Onset  . Alcohol abuse Father   . Anxiety disorder Father   . Depression Father   . Coronary artery disease Father   . Alcohol abuse Brother   . Coronary artery disease Brother   . Schizophrenia Neg Hx   . Diabetes Mellitus II Neg Hx   . Drug abuse Brother   . Depression Brother 58    Commited suicide    History   Social History  . Marital Status: Married    Spouse Name: N/A    Number of Children: N/A  . Years of Education: N/A   Occupational History  . Not on file.   Social History Main Topics  . Smoking status: Current Every Day Smoker -- 1.25 packs/day for 30 years   . Smokeless tobacco: Not on file  . Alcohol Use: No     Comment: Quit drinking. 2-3 fifths, binge drinking.  . Drug Use: Yes    Special: Marijuana     Comment: last used 02/02/2013; Caffiene-2 liters-Dr. Malachi Bonds.  . Sexual Activity: Not Currently    Partners: Female   Other Topics Concern  . Not on file   Social History Narrative  . No narrative on file    Review of Systems  Constitutional: Negative for fever, chills, diaphoresis, activity change, appetite change and fatigue.  HENT: Negative for ear pain, nosebleeds, congestion, facial swelling, rhinorrhea, neck pain, neck stiffness and ear discharge.  Eyes: Negative for pain, discharge, redness, itching and visual disturbance.  Respiratory: Negative for cough, choking, chest tightness, shortness of breath, wheezing and stridor.  Cardiovascular: Negative for chest pain, palpitations and leg swelling.  Gastrointestinal: Negative for abdominal distention, vomiting, diarrhea or consitpation Genitourinary: Negative for dysuria, urgency, frequency, hematuria, flank pain, decreased urine volume, difficulty urinating and dyspareunia.  Musculoskeletal: Negative for back pain, joint swelling, arthralgias or gait problem.  Neurological:  Negative for dizziness, tremors, seizures, syncope, facial asymmetry, speech difficulty, weakness, light-headedness, numbness and headaches.  Hematological: Negative for adenopathy. Does not bruise/bleed easily.  Psychiatric/Behavioral: Negative for hallucinations, behavioral problems, confusion, dysphoric mood   Objective:   Filed Vitals:   04/17/13 1232  BP: 159/96  Pulse: 94  Temp: 97.7 F (36.5 C)  Resp: 16   Filed Weights   04/17/13 1232  Weight: 281 lb (127.461 kg)    Physical Exam ______ Constitutional: Appears well-developed and well-nourished. No distress. ____ HENT: Normocephalic. External right and left ear normal. Oropharynx is clear and moist. ____ Eyes: Conjunctivae and EOM are  normal. PERRLA, no scleral icterus. ____ Neck: Normal ROM. Neck supple. No JVD. No tracheal deviation. No thyromegaly. ____ CVS: RRR, S1/S2 +, no murmurs, no gallops, no carotid bruit.  Pulmonary: Effort and breath sounds normal, no stridor, rhonchi, wheezes, rales.  Abdominal: Soft. BS +,  no distension, tenderness, rebound or guarding. ________ Musculoskeletal: Normal range of motion. No edema and no tenderness. ____ Neuro: Alert. Normal reflexes, muscle tone coordination. No cranial nerve deficit. Skin: Skin is warm and dry. No rash noted. Not diaphoretic. No erythema. No pallor. ____ Psychiatric: Normal mood and affect. Behavior, judgment, thought content normal. __  Lab Results  Component Value Date   WBC 11.2* 10/19/2012   HGB 18.0* 10/19/2012   HCT 48.9 10/19/2012   MCV 91.9 10/19/2012   PLT 185 10/19/2012   Lab Results  Component Value Date   CREATININE 0.95 02/13/2013   BUN 13 02/13/2013   NA 139 02/13/2013   K 4.1 02/13/2013   CL 101 02/13/2013   CO2 24 02/13/2013    Lab Results  Component Value Date   HGBA1C 5.7* 02/13/2013   Lipid Panel     Component Value Date/Time   CHOL 169 02/13/2013 1315   TRIG 189* 02/13/2013 1315   HDL 36* 02/13/2013 1315   CHOLHDL 4.7 02/13/2013 1315   VLDL 38 02/13/2013 1315   [place] 95 02/13/2013 1315        Patient Active Problem List   Diagnosis Date Noted  . Bipolar I disorder, most recent episode manic, severe without psychotic features 02/13/2013       Assessment and plan:  Left shoulder pain-chronic -Increase tramadol to 100 mg every 6 hours as needed -In clinic has declined to see him  Hypertension -start low-dose HCTZ -Initiate diet and exercise plan -Asked to make a record of his blood pressures and bring with him on the next visit  Hyperlipidemia - continue current meds  Hypertriglyceridemia,  glucose intolerance, morbid obesity - advised diet and weight loss and recheck in 3 months  Nicotine abuse -  advised to quit  Debbe Odea, MD

## 2013-04-17 NOTE — Progress Notes (Unsigned)
Pt here f/u pain control States he was rejected from pain clinic referral Requesting Tramadol refill

## 2013-04-24 ENCOUNTER — Telehealth: Payer: Self-pay | Admitting: Emergency Medicine

## 2013-04-24 ENCOUNTER — Telehealth: Payer: Self-pay | Admitting: Internal Medicine

## 2013-04-24 NOTE — Telephone Encounter (Signed)
Outpatient pharmacy calling because pt's Tramadol script needs to be signed.  They can take a verbal confirmation. Please f/u with pharmacy.

## 2013-04-24 NOTE — Telephone Encounter (Signed)
[facility]oke [facility]. Script was filled and given to pt

## 2013-05-02 ENCOUNTER — Other Ambulatory Visit: Payer: Self-pay | Admitting: Internal Medicine

## 2013-05-02 MED ORDER — EZETIMIBE-SIMVASTATIN 10-20 MG PO TABS: 1.0000 | ORAL_TABLET | Freq: Every day | ORAL | Status: DC

## 2013-05-08 ENCOUNTER — Telehealth (HOSPITAL_COMMUNITY): Payer: Self-pay

## 2013-05-08 MED ORDER — LITHIUM CARBONATE 150 MG PO CAPS: ORAL_CAPSULE | ORAL | Status: DC
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
## 2013-05-09 ENCOUNTER — Ambulatory Visit (HOSPITAL_COMMUNITY): Payer: Self-pay | Admitting: Psychiatry

## 2013-05-17 ENCOUNTER — Ambulatory Visit: Payer: Self-pay

## 2013-05-22 ENCOUNTER — Ambulatory Visit: Payer: Self-pay | Admitting: Internal Medicine

## 2013-05-25 DIAGNOSIS — G894 Chronic pain syndrome: Secondary | ICD-10-CM | POA: Insufficient documentation

## 2013-05-25 DIAGNOSIS — M5030 Other cervical disc degeneration, unspecified cervical region: Secondary | ICD-10-CM | POA: Insufficient documentation

## 2013-05-25 DIAGNOSIS — M5136 Other intervertebral disc degeneration, lumbar region: Secondary | ICD-10-CM | POA: Insufficient documentation

## 2013-06-01 ENCOUNTER — Ambulatory Visit: Payer: Self-pay | Attending: Internal Medicine | Admitting: Internal Medicine

## 2013-06-01 ENCOUNTER — Encounter: Payer: Self-pay | Admitting: Internal Medicine

## 2013-06-01 VITALS — BP 150/81 | HR 76 | Temp 97.8°F | Resp 18

## 2013-06-01 DIAGNOSIS — F329 Major depressive disorder, single episode, unspecified: Secondary | ICD-10-CM

## 2013-06-01 DIAGNOSIS — F17200 Nicotine dependence, unspecified, uncomplicated: Secondary | ICD-10-CM | POA: Insufficient documentation

## 2013-06-01 DIAGNOSIS — E7800 Pure hypercholesterolemia, unspecified: Secondary | ICD-10-CM | POA: Insufficient documentation

## 2013-06-01 DIAGNOSIS — I2510 Atherosclerotic heart disease of native coronary artery without angina pectoris: Secondary | ICD-10-CM | POA: Insufficient documentation

## 2013-06-01 DIAGNOSIS — F4310 Post-traumatic stress disorder, unspecified: Secondary | ICD-10-CM | POA: Insufficient documentation

## 2013-06-01 DIAGNOSIS — Z09 Encounter for follow-up examination after completed treatment for conditions other than malignant neoplasm: Secondary | ICD-10-CM | POA: Insufficient documentation

## 2013-06-01 DIAGNOSIS — K7689 Other specified diseases of liver: Secondary | ICD-10-CM | POA: Insufficient documentation

## 2013-06-01 DIAGNOSIS — F3289 Other specified depressive episodes: Secondary | ICD-10-CM

## 2013-06-01 DIAGNOSIS — F319 Bipolar disorder, unspecified: Secondary | ICD-10-CM | POA: Insufficient documentation

## 2013-06-01 DIAGNOSIS — F32A Depression, unspecified: Secondary | ICD-10-CM

## 2013-06-01 MED ORDER — OXYCODONE-ACETAMINOPHEN 5-325 MG PO TABS: 1.0000 | ORAL_TABLET | Freq: Three times a day (TID) | ORAL | Status: DC | PRN

## 2013-06-01 NOTE — Progress Notes (Signed)
Pt here to f/u htn and cholesterol Chronic pain in neck,shoulders and back

## 2013-06-01 NOTE — Progress Notes (Signed)
Patient ID: Oscar Hall, male   DOB: Sep 02, 1960, 53 y.o.   MRN: [id number]   CC: Requiring refills on medicines  HPI: Patient is 53 year old male who comes to clinic requiring refills on medications. He denies chest pain or shortness of breath, no abdominal or urinary concerns, no sicknesses or recent hospitalizations.  Allergies  Allergen Reactions  . Celebrex [Celecoxib]   . Neurontin [Gabapentin]   . Penicillins     Blisters on tongue  . Soma [Carisoprodol]   . Sulfa Antibiotics   . Toradol [Ketorolac Tromethamine]    Past Medical History  Diagnosis Date  . PTSD (post-traumatic stress disorder)   . Coronary artery disease   . Hypercholesterolemia   . Fatty liver    Current Outpatient Prescriptions on File Prior to Visit  Medication Sig Dispense Refill  . ezetimibe-simvastatin (VYTORIN) 10-20 MG per tablet Take 1 tablet by mouth daily.  90 tablet  3  . hydrochlorothiazide (HYDRODIURIL) 25 MG tablet Take 0.5 tablets (12.5 mg total) by mouth daily.  90 tablet  3  . lithium carbonate 150 MG capsule Take one capsule  for 7 days, then 2 capsules for 7 days, then 3 capsules for 7 days, then 4 capsules daily.  70 capsule  1   No current facility-administered medications on file prior to visit.   Family History  Problem Relation Age of Onset  . Alcohol abuse Father   . Anxiety disorder Father   . Depression Father   . Coronary artery disease Father   . Alcohol abuse Brother   . Coronary artery disease Brother   . Schizophrenia Neg Hx   . Diabetes Mellitus II Neg Hx   . Drug abuse Brother   . Depression Brother 58    Commited suicide    History   Social History  . Marital Status: Married    Spouse Name: N/A    Number of Children: N/A  . Years of Education: N/A   Occupational History  . Not on file.   Social History Main Topics  . Smoking status: Current Every Day Smoker -- 1.25 packs/day for 30 years  . Smokeless tobacco: Not on file  . Alcohol Use: No   Comment: Quit drinking. 2-3 fifths, binge drinking.  . Drug Use: Yes    Special: Marijuana     Comment: last used 02/02/2013; Caffiene-2 liters-Dr. Malachi Bonds.  . Sexual Activity: Not Currently    Partners: Female   Other Topics Concern  . Not on file   Social History Narrative  . No narrative on file    Review of Systems  Constitutional: Negative for fever, chills, diaphoresis, activity change, appetite change and fatigue.  HENT: Negative for ear pain, nosebleeds, congestion, facial swelling, rhinorrhea, neck pain, neck stiffness and ear discharge.   Eyes: Negative for pain, discharge, redness, itching and visual disturbance.  Respiratory: Negative for cough, choking, chest tightness, shortness of breath, wheezing and stridor.   Cardiovascular: Negative for chest pain, palpitations and leg swelling.  Gastrointestinal: Negative for abdominal distention.  Genitourinary: Negative for dysuria, urgency, frequency, hematuria, flank pain, decreased urine volume, difficulty urinating and dyspareunia.  Musculoskeletal: Negative for back pain, joint swelling, arthralgias and gait problem.  Neurological: Negative for dizziness, tremors, seizures, syncope, facial asymmetry, speech difficulty, weakness, light-headedness, numbness and headaches.  Hematological: Negative for adenopathy. Does not bruise/bleed easily.  Psychiatric/Behavioral: Negative for hallucinations, behavioral problems, confusion, dysphoric mood, decreased concentration and agitation.    Objective:   Filed Vitals:   06/01/13 1243  BP: 150/81  Pulse: 76  Temp: 97.8 F (36.6 C)  Resp: 18    Physical Exam  Constitutional: Appears well-developed and well-nourished. No distress.  HENT: Normocephalic. External right and left ear normal. Oropharynx is clear and moist.  Eyes: Conjunctivae and EOM are normal. PERRLA, no scleral icterus.  Neck: Normal ROM. Neck supple. No JVD. No tracheal deviation. No thyromegaly.  CVS: RRR,  S1/S2 +, no murmurs, no gallops, no carotid bruit.  Pulmonary: Effort and breath sounds normal, no stridor, rhonchi, wheezes, rales.  Abdominal: Soft. BS +,  no distension, tenderness, rebound or guarding.  Musculoskeletal: Normal range of motion. No edema and no tenderness.  Lymphadenopathy: No lymphadenopathy noted, cervical, inguinal. Neuro: Alert. Normal reflexes, muscle tone coordination. No cranial nerve deficit. Skin: Skin is warm and dry. No rash noted. Not diaphoretic. No erythema. No pallor.  Psychiatric: Normal mood and affect. Behavior, judgment, thought content normal.   Lab Results  Component Value Date   WBC 11.2* 10/19/2012   HGB 18.0* 10/19/2012   HCT 48.9 10/19/2012   MCV 91.9 10/19/2012   PLT 185 10/19/2012   Lab Results  Component Value Date   CREATININE 0.95 02/13/2013   BUN 13 02/13/2013   NA 139 02/13/2013   K 4.1 02/13/2013   CL 101 02/13/2013   CO2 24 02/13/2013    Lab Results  Component Value Date   HGBA1C 5.7* 02/13/2013   Lipid Panel     Component Value Date/Time   CHOL 169 02/13/2013 1315   TRIG 189* 02/13/2013 1315   HDL 36* 02/13/2013 1315   CHOLHDL 4.7 02/13/2013 1315   VLDL 38 02/13/2013 1315   [place] 95 02/13/2013 1315       Assessment and plan:   Patient Active Problem List   Diagnosis Date Noted  . Bipolar I disorder, most recent episode manic, severe without psychotic features 02/13/2013   - Stable continue current medical regimen

## 2013-06-05 DIAGNOSIS — M7540 Impingement syndrome of unspecified shoulder: Secondary | ICD-10-CM | POA: Insufficient documentation

## 2013-06-12 ENCOUNTER — Emergency Department (HOSPITAL_COMMUNITY)
Admission: EM | Admit: 2013-06-12 | Discharge: 2013-06-12 | Disposition: A | Discharge status: Home / Self Care | Payer: Self-pay | Attending: Emergency Medicine | Admitting: Emergency Medicine

## 2013-06-12 ENCOUNTER — Emergency Department (HOSPITAL_COMMUNITY): Payer: Self-pay

## 2013-06-12 ENCOUNTER — Encounter (HOSPITAL_COMMUNITY): Payer: Self-pay | Admitting: Emergency Medicine

## 2013-06-12 DIAGNOSIS — K429 Umbilical hernia without obstruction or gangrene: Secondary | ICD-10-CM | POA: Insufficient documentation

## 2013-06-12 DIAGNOSIS — Z79899 Other long term (current) drug therapy: Secondary | ICD-10-CM | POA: Insufficient documentation

## 2013-06-12 DIAGNOSIS — E860 Dehydration: Secondary | ICD-10-CM | POA: Insufficient documentation

## 2013-06-12 DIAGNOSIS — E7800 Pure hypercholesterolemia, unspecified: Secondary | ICD-10-CM | POA: Insufficient documentation

## 2013-06-12 DIAGNOSIS — F17200 Nicotine dependence, unspecified, uncomplicated: Secondary | ICD-10-CM | POA: Insufficient documentation

## 2013-06-12 DIAGNOSIS — R21 Rash and other nonspecific skin eruption: Secondary | ICD-10-CM | POA: Insufficient documentation

## 2013-06-12 DIAGNOSIS — Z8719 Personal history of other diseases of the digestive system: Secondary | ICD-10-CM | POA: Insufficient documentation

## 2013-06-12 DIAGNOSIS — K5289 Other specified noninfective gastroenteritis and colitis: Secondary | ICD-10-CM | POA: Insufficient documentation

## 2013-06-12 DIAGNOSIS — I2510 Atherosclerotic heart disease of native coronary artery without angina pectoris: Secondary | ICD-10-CM | POA: Insufficient documentation

## 2013-06-12 DIAGNOSIS — Z880 Allergy status to penicillin: Secondary | ICD-10-CM | POA: Insufficient documentation

## 2013-06-12 DIAGNOSIS — K529 Noninfective gastroenteritis and colitis, unspecified: Secondary | ICD-10-CM

## 2013-06-12 LAB — COMPREHENSIVE METABOLIC PANEL
ALK PHOS: 84 U/L (ref 39–117)
ALT: 51 U/L (ref 0–53)
AST: 44 U/L — ABNORMAL HIGH (ref 0–37)
Albumin: 4 g/dL (ref 3.5–5.2)
BUN: 16 mg/dL (ref 6–23)
CO2: 26 meq/L (ref 19–32)
Calcium: 9.8 mg/dL (ref 8.4–10.5)
Chloride: 94 mEq/L — ABNORMAL LOW (ref 96–112)
Creatinine, Ser: 0.84 mg/dL (ref 0.50–1.35)
GLUCOSE: 113 mg/dL — AB (ref 70–99)
POTASSIUM: 3.4 meq/L — AB (ref 3.7–5.3)
SODIUM: 135 meq/L — AB (ref 137–147)
Total Bilirubin: 1 mg/dL (ref 0.3–1.2)
Total Protein: 8.5 g/dL — ABNORMAL HIGH (ref 6.0–8.3)

## 2013-06-12 LAB — CBC WITH DIFFERENTIAL/PLATELET
Basophils Absolute: 0 10*3/uL (ref 0.0–0.1)
Basophils Relative: 0 % (ref 0–1)
EOS ABS: 0.3 10*3/uL (ref 0.0–0.7)
Eosinophils Relative: 2 % (ref 0–5)
HCT: 51.4 % (ref 39.0–52.0)
Hemoglobin: 18.3 g/dL — ABNORMAL HIGH (ref 13.0–17.0)
Lymphocytes Relative: 20 % (ref 12–46)
Lymphs Abs: 2.7 10*3/uL (ref 0.7–4.0)
MCH: 33.6 pg (ref 26.0–34.0)
MCHC: 35.6 g/dL (ref 30.0–36.0)
MCV: 94.3 fL (ref 78.0–100.0)
MONOS PCT: 10 % (ref 3–12)
Monocytes Absolute: 1.3 10*3/uL — ABNORMAL HIGH (ref 0.1–1.0)
NEUTROS PCT: 68 % (ref 43–77)
Neutro Abs: 8.9 10*3/uL — ABNORMAL HIGH (ref 1.7–7.7)
PLATELETS: 250 10*3/uL (ref 150–400)
RBC: 5.45 MIL/uL (ref 4.22–5.81)
RDW: 12.9 % (ref 11.5–15.5)
WBC: 13.2 10*3/uL — ABNORMAL HIGH (ref 4.0–10.5)

## 2013-06-12 LAB — LIPASE, BLOOD: Lipase: 44 U/L (ref 11–59)

## 2013-06-12 MED ORDER — HYDROMORPHONE HCL PF 1 MG/ML IJ SOLN
1.0000 mg | Freq: Once | INTRAMUSCULAR | Status: AC
  Administered 2013-06-12: 1 mg via INTRAVENOUS
  Filled 2013-06-12: qty 1

## 2013-06-12 MED ORDER — ONDANSETRON HCL 4 MG/2ML IJ SOLN
INTRAMUSCULAR | Status: DC
Start: 2013-06-12 — End: 2013-06-12
  Filled 2013-06-12: qty 2

## 2013-06-12 MED ORDER — SODIUM CHLORIDE 0.9 % IV BOLUS (SEPSIS)
250.0000 mL | Freq: Once | INTRAVENOUS | Status: AC
  Administered 2013-06-12: 250 mL via INTRAVENOUS

## 2013-06-12 MED ORDER — HYDROMORPHONE HCL PF 1 MG/ML IJ SOLN
1.0000 mg | Freq: Once | INTRAMUSCULAR | Status: AC
  Administered 2013-06-12: 1 mg via INTRAVENOUS

## 2013-06-12 MED ORDER — IOHEXOL 300 MG/ML  SOLN
50.0000 mL | Freq: Once | INTRAMUSCULAR | Status: AC | PRN
  Administered 2013-06-12: 50 mL via ORAL

## 2013-06-12 MED ORDER — ONDANSETRON HCL 4 MG/2ML IJ SOLN: 4.0000 mg | Freq: Once | INTRAMUSCULAR | Status: DC

## 2013-06-12 MED ORDER — HYDROCODONE-ACETAMINOPHEN 5-325 MG PO TABS: 1.0000 | ORAL_TABLET | Freq: Four times a day (QID) | ORAL | Status: DC | PRN

## 2013-06-12 MED ORDER — SODIUM CHLORIDE 0.9 % IV SOLN: INTRAVENOUS | Status: DC

## 2013-06-12 MED ORDER — HYDROMORPHONE HCL PF 1 MG/ML IJ SOLN
INTRAMUSCULAR | Status: AC
  Filled 2013-06-12: qty 1

## 2013-06-12 MED ORDER — PROMETHAZINE HCL 25 MG PO TABS: 25.0000 mg | ORAL_TABLET | Freq: Four times a day (QID) | ORAL | Status: DC | PRN

## 2013-06-12 MED ORDER — ONDANSETRON HCL 4 MG/2ML IJ SOLN
4.0000 mg | Freq: Once | INTRAMUSCULAR | Status: AC
  Administered 2013-06-12: 4 mg via INTRAVENOUS
  Filled 2013-06-12: qty 2

## 2013-06-12 MED ORDER — ONDANSETRON HCL 4 MG/2ML IJ SOLN
4.0000 mg | Freq: Once | INTRAMUSCULAR | Status: AC
  Administered 2013-06-12: 4 mg via INTRAVENOUS

## 2013-06-12 MED ORDER — IOHEXOL 300 MG/ML  SOLN
100.0000 mL | Freq: Once | INTRAMUSCULAR | Status: AC | PRN
  Administered 2013-06-12: 100 mL via INTRAVENOUS

## 2013-06-12 NOTE — Discharge Instructions (Signed)
Recommend clear liquids with sugar advance to bland diet as tolerated. Take antinausea medicine on a regular basis. Refill provided. Take pain medicine as needed. Return for any new or worse symptoms. Followup with your doctor if not improved in the next few days.

## 2013-06-12 NOTE — ED Notes (Signed)
Pt c/o abd pain/n/v/d x 2 days.

## 2013-06-12 NOTE — ED Provider Notes (Signed)
CSN: [id number]     Arrival date & time 06/12/13  1040 History  This chart was scribed for Mervin Kung, MD by Ludger Nutting, ED Scribe. This patient was seen in room APA16A/APA16A and the patient's care was started 12:37 PM.      Chief Complaint  Patient presents with  . Abdominal Pain      The history is provided by the patient. No language interpreter was used.    HPI Comments: Oscar Hall is a 53 y.o. male who presents to the Emergency Department complaining of 3 days of abdominal pain with associated episodes of nausea, vomiting, diarrhea. He reports several episodes of vomiting and diarrhea each day since onset. He rates his pain as 9/10 and describes it as cramping. He is able to tolerate small amounts of water. He denies hematochezia, hematemesis, fever.   Past Medical History  Diagnosis Date  . PTSD (post-traumatic stress disorder)   . Coronary artery disease   . Hypercholesterolemia   . Fatty liver    Past Surgical History  Procedure Laterality Date  . Throat surgery      traumatic injury  . Hand surgery    . Knee surgery     Family History  Problem Relation Age of Onset  . Alcohol abuse Father   . Anxiety disorder Father   . Depression Father   . Coronary artery disease Father   . Alcohol abuse Brother   . Coronary artery disease Brother   . Schizophrenia Neg Hx   . Diabetes Mellitus II Neg Hx   . Drug abuse Brother   . Depression Brother 37    Commited suicide    History  Substance Use Topics  . Smoking status: Current Every Day Smoker -- 1.25 packs/day for 30 years  . Smokeless tobacco: Not on file  . Alcohol Use: No     Comment: Quit drinking. 2-3 fifths, binge drinking.    Review of Systems  Constitutional: Positive for fever, chills and diaphoresis.  HENT: Negative for rhinorrhea and sore throat.   Eyes: Negative for visual disturbance.  Respiratory: Negative for cough and shortness of breath.   Cardiovascular: Negative for chest pain and  leg swelling.  Gastrointestinal: Positive for nausea, vomiting, abdominal pain and diarrhea.  Genitourinary: Negative for dysuria.  Musculoskeletal: Positive for myalgias. Negative for back pain and neck pain.  Skin: Positive for rash.  Neurological: Negative for headaches.  Hematological: Does not bruise/bleed easily.  Psychiatric/Behavioral: Negative for confusion.      Allergies  Celebrex; Lithium; Neurontin; Other; Penicillins; Soma; Sulfa antibiotics; Toradol; Tramadol; and Lamictal  Home Medications   Current Outpatient Rx  Name  Route  Sig  Dispense  Refill  . ezetimibe-simvastatin (VYTORIN) 10-20 MG per tablet   Oral   Take 1 tablet by mouth daily.   90 tablet   3   . hydrochlorothiazide (HYDRODIURIL) 25 MG tablet   Oral   Take 0.5 tablets (12.5 mg total) by mouth daily.   90 tablet   3   . oxyCODONE-acetaminophen (ROXICET) 5-325 MG per tablet   Oral   Take 1 tablet by mouth every 8 (eight) hours as needed for severe pain.   90 tablet   0   . HYDROcodone-acetaminophen (NORCO/VICODIN) 5-325 MG per tablet   Oral   Take 1-2 tablets by mouth every 6 (six) hours as needed for moderate pain.   20 tablet   0   . promethazine (PHENERGAN) 25 MG tablet   Oral  Take 1 tablet (25 mg total) by mouth every 6 (six) hours as needed for nausea or vomiting.   12 tablet   1    BP 120/73  Pulse 88  Temp(Src) 97.6 F (36.4 C)  Resp 18  Ht 5\' 11"  (1.803 m)  Wt 278 lb (126.1 kg)  BMI 38.79 kg/m2  SpO2 96% Physical Exam  Nursing note and vitals reviewed. Constitutional: He is oriented to person, place, and time. He appears well-developed and well-nourished.  HENT:  Head: Normocephalic and atraumatic.  Mouth/Throat: Mucous membranes are dry.  Eyes: EOM are normal.  Neck: Normal range of motion.  Cardiovascular: Normal rate, regular rhythm and normal heart sounds.   Pulmonary/Chest: Effort normal and breath sounds normal. No respiratory distress. He has no wheezes.  He has no rales. He exhibits no tenderness.  Abdominal: Soft. Bowel sounds are normal. He exhibits no distension. There is no tenderness. There is no rebound and no guarding.  Abdominal hernia that is non tender.   Musculoskeletal: Normal range of motion. He exhibits no edema and no tenderness.  Neurological: He is alert and oriented to person, place, and time.  Skin: Skin is warm and dry.  Maculopapular erythematous rash to the lumbar region   Psychiatric: He has a normal mood and affect.    ED Course  Procedures (including critical care time)  DIAGNOSTIC STUDIES: Oxygen Saturation is 99% on RA, normal by my interpretation.    COORDINATION OF CARE: 12:40 PM Discussed treatment plan with pt at bedside and pt agreed to plan.   Labs Review Labs Reviewed  COMPREHENSIVE METABOLIC PANEL - Abnormal; Notable for the following:    Sodium 135 (*)    Potassium 3.4 (*)    Chloride 94 (*)    Glucose, Bld 113 (*)    Total Protein 8.5 (*)    AST 44 (*)    All other components within normal limits  CBC WITH DIFFERENTIAL - Abnormal; Notable for the following:    WBC 13.2 (*)    Hemoglobin 18.3 (*)    Neutro Abs 8.9 (*)    Monocytes Absolute 1.3 (*)    All other components within normal limits  LIPASE, BLOOD   Results for orders placed during the hospital encounter of 06/12/13  COMPREHENSIVE METABOLIC PANEL      Result Value Ref Range   Sodium 135 (*) 137 - 147 mEq/L   Potassium 3.4 (*) 3.7 - 5.3 mEq/L   Chloride 94 (*) 96 - 112 mEq/L   CO2 26  19 - 32 mEq/L   Glucose, Bld 113 (*) 70 - 99 mg/dL   BUN 16  6 - 23 mg/dL   Creatinine, Ser 0.84  0.50 - 1.35 mg/dL   Calcium 9.8  8.4 - 10.5 mg/dL   Total Protein 8.5 (*) 6.0 - 8.3 g/dL   Albumin 4.0  3.5 - 5.2 g/dL   AST 44 (*) 0 - 37 U/L   ALT 51  0 - 53 U/L   Alkaline Phosphatase 84  39 - 117 U/L   Total Bilirubin 1.0  0.3 - 1.2 mg/dL   GFR calc non Af Amer >90  >90 mL/min   GFR calc Af Amer >90  >90 mL/min  LIPASE, BLOOD       Result Value Ref Range   Lipase 44  11 - 59 U/L  CBC WITH DIFFERENTIAL      Result Value Ref Range   WBC 13.2 (*) 4.0 - 10.5 K/uL  RBC 5.45  4.22 - 5.81 MIL/uL   Hemoglobin 18.3 (*) 13.0 - 17.0 g/dL   HCT 51.4  39.0 - 52.0 %   MCV 94.3  78.0 - 100.0 fL   MCH 33.6  26.0 - 34.0 pg   MCHC 35.6  30.0 - 36.0 g/dL   RDW 12.9  11.5 - 15.5 %   Platelets 250  150 - 400 K/uL   Neutrophils Relative % 68  43 - 77 %   Neutro Abs 8.9 (*) 1.7 - 7.7 K/uL   Lymphocytes Relative 20  12 - 46 %   Lymphs Abs 2.7  0.7 - 4.0 K/uL   Monocytes Relative 10  3 - 12 %   Monocytes Absolute 1.3 (*) 0.1 - 1.0 K/uL   Eosinophils Relative 2  0 - 5 %   Eosinophils Absolute 0.3  0.0 - 0.7 K/uL   Basophils Relative 0  0 - 1 %   Basophils Absolute 0.0  0.0 - 0.1 K/uL    Imaging Review Ct Abdomen Pelvis W Contrast  06/12/2013   CLINICAL DATA:  Abdominal and lower pelvic pain with nausea and vomiting.  EXAM: CT ABDOMEN AND PELVIS WITH CONTRAST  TECHNIQUE: Multidetector CT imaging of the abdomen and pelvis was performed using the standard protocol following bolus administration of intravenous contrast.  CONTRAST:  168mL OMNIPAQUE IOHEXOL 300 MG/ML SOLN, 68mL OMNIPAQUE IOHEXOL 300 MG/ML SOLN  COMPARISON:  10/19/2012  FINDINGS: Lung bases are within normal.  Abdominal images demonstrate a normal liver, spleen, pancreas, gallbladder and adrenal glands. Kidneys are normal in size without hydronephrosis or nephrolithiasis. Ureters are normal. Appendix is normal. There is no free fluid or focal inflammatory change. There is minimal calcified plaque of the abdominal aorta and iliac vessels. There is very minimal diverticulosis of the colon. There is a moderate-sized left paraumbilical hernia containing only peritoneal fat unchanged.  Pelvic images are otherwise unremarkable. There is mild degenerative change of the spine. There are minimal degenerative changes of the hips. There is a bone island over the left iliac bone.  IMPRESSION:  No acute findings in the abdomen/pelvis.  Stable moderate size left paraumbilical hernia containing only peritoneal fat.  Minimal diverticulosis of the colon.   Electronically Signed   By: Marin Olp M.D.   On: 06/12/2013 14:21     EKG Interpretation   Date/Time:  Monday June 12 2013 11:09:36 EDT Ventricular Rate:  89 PR Interval:  152 QRS Duration: 96 QT Interval:  370 QTC Calculation: 450 R Axis:   13 Text Interpretation:  Normal sinus rhythm Normal ECG No previous ECGs  available Confirmed by Denyce Harr  MD, Ogechi Kuehnel ([id number]) on 06/12/2013 11:17:15  AM      MDM   Final diagnoses:  Gastroenteritis  Hernia, umbilical  Dehydration    Patient improved significantly with hydration antinausea medicine and some pain medication. CT scan of the abdomen without acute findings. Labs consistent with some dehydration. Symptoms most likely related to a viral gastroenteritis. Patient we discharged home with antinausea medicine pain medicine. Patient's diarrhea subsided significantly so antidiarrhea medicine will not be provided. Patient has primary care Dr. to followup with.    I personally performed the services described in this documentation, which was scribed in my presence. The recorded information has been reviewed and is accurate.    Mervin Kung, MD 06/12/13 [phone]

## 2013-06-12 NOTE — ED Notes (Signed)
MD at bedside. 

## 2013-06-12 NOTE — ED Notes (Signed)
Pt wife driving pt home. Pt denies driving at d/c.nad noted.

## 2013-06-15 ENCOUNTER — Emergency Department (HOSPITAL_COMMUNITY): Payer: Self-pay

## 2013-06-15 ENCOUNTER — Emergency Department (HOSPITAL_COMMUNITY)
Admission: EM | Admit: 2013-06-15 | Discharge: 2013-06-15 | Disposition: A | Discharge status: Home / Self Care | Payer: Self-pay | Attending: Emergency Medicine | Admitting: Emergency Medicine

## 2013-06-15 ENCOUNTER — Encounter (HOSPITAL_COMMUNITY): Payer: Self-pay | Admitting: Emergency Medicine

## 2013-06-15 DIAGNOSIS — R109 Unspecified abdominal pain: Secondary | ICD-10-CM | POA: Insufficient documentation

## 2013-06-15 DIAGNOSIS — E7800 Pure hypercholesterolemia, unspecified: Secondary | ICD-10-CM | POA: Insufficient documentation

## 2013-06-15 DIAGNOSIS — Z880 Allergy status to penicillin: Secondary | ICD-10-CM | POA: Insufficient documentation

## 2013-06-15 DIAGNOSIS — F17200 Nicotine dependence, unspecified, uncomplicated: Secondary | ICD-10-CM | POA: Insufficient documentation

## 2013-06-15 DIAGNOSIS — R197 Diarrhea, unspecified: Secondary | ICD-10-CM | POA: Insufficient documentation

## 2013-06-15 DIAGNOSIS — Z79899 Other long term (current) drug therapy: Secondary | ICD-10-CM | POA: Insufficient documentation

## 2013-06-15 DIAGNOSIS — K429 Umbilical hernia without obstruction or gangrene: Secondary | ICD-10-CM | POA: Insufficient documentation

## 2013-06-15 DIAGNOSIS — Z8659 Personal history of other mental and behavioral disorders: Secondary | ICD-10-CM | POA: Insufficient documentation

## 2013-06-15 DIAGNOSIS — I2510 Atherosclerotic heart disease of native coronary artery without angina pectoris: Secondary | ICD-10-CM | POA: Insufficient documentation

## 2013-06-15 DIAGNOSIS — R112 Nausea with vomiting, unspecified: Secondary | ICD-10-CM | POA: Insufficient documentation

## 2013-06-15 LAB — URINALYSIS, ROUTINE W REFLEX MICROSCOPIC
Bilirubin Urine: NEGATIVE
Glucose, UA: NEGATIVE mg/dL
Ketones, ur: NEGATIVE mg/dL
Leukocytes, UA: NEGATIVE
Nitrite: NEGATIVE
PH: 6 (ref 5.0–8.0)
Protein, ur: NEGATIVE mg/dL
SPECIFIC GRAVITY, URINE: 1.01 (ref 1.005–1.030)
Urobilinogen, UA: 2 mg/dL — ABNORMAL HIGH (ref 0.0–1.0)

## 2013-06-15 LAB — CBC WITH DIFFERENTIAL/PLATELET
BASOS PCT: 0 % (ref 0–1)
Basophils Absolute: 0 10*3/uL (ref 0.0–0.1)
EOS ABS: 0.3 10*3/uL (ref 0.0–0.7)
Eosinophils Relative: 3 % (ref 0–5)
HCT: 49.6 % (ref 39.0–52.0)
Hemoglobin: 18 g/dL — ABNORMAL HIGH (ref 13.0–17.0)
LYMPHS ABS: 2.2 10*3/uL (ref 0.7–4.0)
Lymphocytes Relative: 19 % (ref 12–46)
MCH: 34 pg (ref 26.0–34.0)
MCHC: 36.3 g/dL — ABNORMAL HIGH (ref 30.0–36.0)
MCV: 93.6 fL (ref 78.0–100.0)
Monocytes Absolute: 0.8 10*3/uL (ref 0.1–1.0)
Monocytes Relative: 7 % (ref 3–12)
NEUTROS PCT: 72 % (ref 43–77)
Neutro Abs: 8.2 10*3/uL — ABNORMAL HIGH (ref 1.7–7.7)
PLATELETS: 220 10*3/uL (ref 150–400)
RBC: 5.3 MIL/uL (ref 4.22–5.81)
RDW: 12.4 % (ref 11.5–15.5)
WBC: 11.5 10*3/uL — ABNORMAL HIGH (ref 4.0–10.5)

## 2013-06-15 LAB — LIPASE, BLOOD: Lipase: 22 U/L (ref 11–59)

## 2013-06-15 LAB — URINE MICROSCOPIC-ADD ON

## 2013-06-15 LAB — COMPREHENSIVE METABOLIC PANEL
ALBUMIN: 3.9 g/dL (ref 3.5–5.2)
ALK PHOS: 80 U/L (ref 39–117)
ALT: 41 U/L (ref 0–53)
AST: 26 U/L (ref 0–37)
BUN: 12 mg/dL (ref 6–23)
CO2: 30 mEq/L (ref 19–32)
Calcium: 9.5 mg/dL (ref 8.4–10.5)
Chloride: 96 mEq/L (ref 96–112)
Creatinine, Ser: 0.9 mg/dL (ref 0.50–1.35)
GFR calc Af Amer: >90 mL/min (ref >90)
GFR calc non Af Amer: >90 mL/min (ref >90)
GLUCOSE: 120 mg/dL — AB (ref 70–99)
POTASSIUM: 3.4 meq/L — AB (ref 3.7–5.3)
Sodium: 138 mEq/L (ref 137–147)
TOTAL PROTEIN: 8.1 g/dL (ref 6.0–8.3)
Total Bilirubin: 0.7 mg/dL (ref 0.3–1.2)

## 2013-06-15 LAB — TROPONIN I: Troponin I: <0.3 ng/mL (ref <0.30)

## 2013-06-15 MED ORDER — SODIUM CHLORIDE 0.9 % IV BOLUS (SEPSIS)
1000.0000 mL | Freq: Once | INTRAVENOUS | Status: AC
  Administered 2013-06-15: 1000 mL via INTRAVENOUS

## 2013-06-15 MED ORDER — ONDANSETRON HCL 4 MG/2ML IJ SOLN
4.0000 mg | Freq: Once | INTRAMUSCULAR | Status: AC
  Administered 2013-06-15: 4 mg via INTRAVENOUS
  Filled 2013-06-15: qty 2

## 2013-06-15 MED ORDER — HYDROMORPHONE HCL PF 1 MG/ML IJ SOLN
1.0000 mg | Freq: Once | INTRAMUSCULAR | Status: AC
  Administered 2013-06-15: 1 mg via INTRAVENOUS
  Filled 2013-06-15: qty 1

## 2013-06-15 MED ORDER — OXYCODONE HCL 5 MG PO TABS: 5.0000 mg | ORAL_TABLET | ORAL | Status: DC | PRN

## 2013-06-15 MED ORDER — PROMETHAZINE HCL 25 MG PO TABS: 25.0000 mg | ORAL_TABLET | Freq: Four times a day (QID) | ORAL | Status: DC | PRN

## 2013-06-15 NOTE — Discharge Instructions (Signed)
Abdominal Pain, Adult °Many things can cause abdominal pain. Usually, abdominal pain is not caused by a disease and will improve without treatment. It can often be observed and treated at home. Your health care provider will do a physical exam and possibly order blood tests and X-rays to help determine the seriousness of your pain. However, in many cases, more time must pass before a clear cause of the pain can be found. Before that point, your health care provider may not know if you need more testing or further treatment. °HOME CARE INSTRUCTIONS  °Monitor your abdominal pain for any changes. The following actions may help to alleviate any discomfort you are experiencing: °· Only take over-the-counter or prescription medicines as directed by your health care provider. °· Do not take laxatives unless directed to do so by your health care provider. °· Try a clear liquid diet (broth, tea, or water) as directed by your health care provider. Slowly move to a bland diet as tolerated. °SEEK MEDICAL CARE IF: °· You have unexplained abdominal pain. °· You have abdominal pain associated with nausea or diarrhea. °· You have pain when you urinate or have a bowel movement. °· You experience abdominal pain that wakes you in the night. °· You have abdominal pain that is worsened or improved by eating food. °· You have abdominal pain that is worsened with eating fatty foods. °SEEK IMMEDIATE MEDICAL CARE IF:  °· Your pain does not go away within 2 hours. °· You have a fever. °· You keep throwing up (vomiting). °· Your pain is felt only in portions of the abdomen, such as the right side or the left lower portion of the abdomen. °· You pass bloody or black tarry stools. °MAKE SURE YOU: °· Understand these instructions.   °· Will watch your condition.   °· Will get help right away if you are not doing well or get worse.   °Document Released: 12/17/2004 Document Revised: 12/28/2012 Document Reviewed: 11/16/2012 °[employer]® Patient  Information ©2014 [employer]. ° °Nausea and Vomiting °Nausea means you feel sick to your stomach. Throwing up (vomiting) is a reflex where stomach contents come out of your mouth. °HOME CARE  °· Take medicine as told by your doctor. °· Do not force yourself to eat. However, you do need to drink fluids. °· If you feel like eating, eat a normal diet as told by your doctor. °· Eat rice, wheat, potatoes, bread, lean meats, yogurt, fruits, and vegetables. °· Avoid high-fat foods. °· Drink enough fluids to keep your pee (urine) clear or pale yellow. °· Ask your doctor how to replace body fluid losses (rehydrate). Signs of body fluid loss (dehydration) include: °· Feeling very thirsty. °· Dry lips and mouth. °· Feeling dizzy. °· Dark pee. °· Peeing less than normal. °· Feeling confused. °· Fast breathing or heart rate. °GET HELP RIGHT AWAY IF:  °· You have blood in your throw up. °· You have black or bloody poop (stool). °· You have a bad headache or stiff neck. °· You feel confused. °· You have bad belly (abdominal) pain. °· You have chest pain or trouble breathing. °· You do not pee at least once every 8 hours. °· You have cold, clammy skin. °· You keep throwing up after 24 to 48 hours. °· You have a fever. °MAKE SURE YOU:  °· Understand these instructions. °· Will watch your condition. °· Will get help right away if you are not doing well or get worse. °Document Released: 08/26/2007 Document Revised: 06/01/2011   Document Reviewed: 08/08/2010 °[employer]® Patient Information ©2014 [employer]. ° °

## 2013-06-15 NOTE — ED Notes (Signed)
Pt reports abdominal pain, n/v/d that began last Friday night. Pt was seen at [facility] on Monday for same and was discharged.  Pt returned today for inability to tolerate PO intake and continued abdominal pain and nausea.

## 2013-06-15 NOTE — ED Provider Notes (Signed)
CSN: [id number]     Arrival date & time 06/15/13  1256 History  This chart was scribed for [employer]. Alvino Chapel, MD by Roxan Diesel, ED scribe.  This patient was seen in room APA06/APA06 and the patient's care was started at 1:34 PM.   Chief Complaint  Patient presents with  . Abdominal Pain    The history is provided by the patient. No language interpreter was used.    HPI Comments: Oscar Hall is a 53 y.o. male who presents to the Emergency Department complaining of 6 days of persistent abdominal pain with associated nausea and vomiting.  Pt was seen here 3 days ago for the same symptoms as well as diarrhea.  He received a CT abdomen which was without acute findings, and was diagnosed with likely viral gastroenteritis and discharged home with anti-emetics.  Since then pt states that he has continued to have unchanged severe cramping abdominal pain.  He was able to tolerate a can of soup on one occasion but 4 hours later began vomiting again and has not been able to tolerate anything else.  He has been "dry heaving" and has been unable to tolerate any fluids.  He reports his diarrhea has resolved but attributes this to not being able to tolerate anything.  He has been passing gas.  He denies CP or SOB.  Pt has an umbilical hernia and denies any recent issues with this to his knowledge.  He states he is able to reduce it himself.    Past Medical History  Diagnosis Date  . PTSD (post-traumatic stress disorder)   . Coronary artery disease   . Hypercholesterolemia   . Fatty liver     Past Surgical History  Procedure Laterality Date  . Throat surgery      traumatic injury  . Hand surgery    . Knee surgery      Family History  Problem Relation Age of Onset  . Alcohol abuse Father   . Anxiety disorder Father   . Depression Father   . Coronary artery disease Father   . Alcohol abuse Brother   . Coronary artery disease Brother   . Schizophrenia Neg Hx   . Diabetes Mellitus II Neg  Hx   . Drug abuse Brother   . Depression Brother 30    Commited suicide     History  Substance Use Topics  . Smoking status: Current Every Day Smoker -- 1.25 packs/day for 30 years  . Smokeless tobacco: Not on file  . Alcohol Use: No     Comment: Quit drinking. 2-3 fifths, binge drinking.     Review of Systems  Respiratory: Negative for shortness of breath.   Cardiovascular: Negative for chest pain.  Gastrointestinal: Positive for nausea, vomiting and abdominal pain. Negative for diarrhea.  All other systems reviewed and are negative.      Allergies  Celebrex; Lithium; Neurontin; Other; Penicillins; Soma; Sulfa antibiotics; Toradol; Tramadol; and Lamictal  Home Medications   Current Outpatient Rx  Name  Route  Sig  Dispense  Refill  . ezetimibe-simvastatin (VYTORIN) 10-20 MG per tablet   Oral   Take 1 tablet by mouth daily.   90 tablet   3   . hydrochlorothiazide (HYDRODIURIL) 25 MG tablet   Oral   Take 0.5 tablets (12.5 mg total) by mouth daily.   90 tablet   3   . HYDROcodone-acetaminophen (NORCO/VICODIN) 5-325 MG per tablet   Oral   Take 1-2 tablets by mouth every 6 (  six) hours as needed for moderate pain.   20 tablet   0   . promethazine (PHENERGAN) 25 MG tablet   Oral   Take 1 tablet (25 mg total) by mouth every 6 (six) hours as needed for nausea or vomiting.   12 tablet   1   . oxyCODONE (ROXICODONE) 5 MG immediate release tablet   Oral   Take 1 tablet (5 mg total) by mouth every 4 (four) hours as needed for severe pain.   10 tablet   0   . promethazine (PHENERGAN) 25 MG tablet   Oral   Take 1 tablet (25 mg total) by mouth every 6 (six) hours as needed for nausea.   20 tablet   0    BP 170/105  Pulse 94  Temp(Src) 97.5 F (36.4 C) (Oral)  Resp 24  Ht 5\' 11"  (1.803 m)  Wt 260 lb (117.935 kg)  BMI 36.28 kg/m2  SpO2 100%  Physical Exam  Nursing note and vitals reviewed. Constitutional: He is oriented to person, place, and time. He  appears well-developed and well-nourished. No distress.  Appears uncomfortable Lungs clear Dry MMs Soft but not completely reducible periumbilical hernia BS normal No inguinal hernias  HENT:  Head: Normocephalic and atraumatic.  Mouth/Throat: Mucous membranes are dry.  Eyes: EOM are normal.  Neck: Neck supple. No tracheal deviation present.  Cardiovascular: Normal rate.   Pulmonary/Chest: Effort normal and breath sounds normal. No respiratory distress. He has no wheezes. He has no rales.  Abdominal: Bowel sounds are normal. A hernia is present.  Soft but not completely reducible periumbilical hernia.  No inguinal hernias.  Musculoskeletal: Normal range of motion.  Neurological: He is alert and oriented to person, place, and time.  Skin: Skin is warm and dry.  Psychiatric: He has a normal mood and affect. His behavior is normal.    ED Course  Procedures (including critical care time)  COORDINATION OF CARE: 1:40 PM-Discussed treatment plan which includes IV fluids, pain medication, anti-emetics, and labs with pt at bedside and pt agreed to plan.    Results for orders placed during the hospital encounter of 06/15/13  CBC WITH DIFFERENTIAL      Result Value Ref Range   WBC 11.5 (*) 4.0 - 10.5 K/uL   RBC 5.30  4.22 - 5.81 MIL/uL   Hemoglobin 18.0 (*) 13.0 - 17.0 g/dL   HCT 49.6  39.0 - 52.0 %   MCV 93.6  78.0 - 100.0 fL   MCH 34.0  26.0 - 34.0 pg   MCHC 36.3 (*) 30.0 - 36.0 g/dL   RDW 12.4  11.5 - 15.5 %   Platelets 220  150 - 400 K/uL   Neutrophils Relative % 72  43 - 77 %   Neutro Abs 8.2 (*) 1.7 - 7.7 K/uL   Lymphocytes Relative 19  12 - 46 %   Lymphs Abs 2.2  0.7 - 4.0 K/uL   Monocytes Relative 7  3 - 12 %   Monocytes Absolute 0.8  0.1 - 1.0 K/uL   Eosinophils Relative 3  0 - 5 %   Eosinophils Absolute 0.3  0.0 - 0.7 K/uL   Basophils Relative 0  0 - 1 %   Basophils Absolute 0.0  0.0 - 0.1 K/uL  COMPREHENSIVE METABOLIC PANEL      Result Value Ref Range   Sodium 138   137 - 147 mEq/L   Potassium 3.4 (*) 3.7 - 5.3 mEq/L   Chloride 96  96 - 112 mEq/L   CO2 30  19 - 32 mEq/L   Glucose, Bld 120 (*) 70 - 99 mg/dL   BUN 12  6 - 23 mg/dL   Creatinine, Ser 0.90  0.50 - 1.35 mg/dL   Calcium 9.5  8.4 - 10.5 mg/dL   Total Protein 8.1  6.0 - 8.3 g/dL   Albumin 3.9  3.5 - 5.2 g/dL   AST 26  0 - 37 U/L   ALT 41  0 - 53 U/L   Alkaline Phosphatase 80  39 - 117 U/L   Total Bilirubin 0.7  0.3 - 1.2 mg/dL   GFR calc non Af Amer >90  >90 mL/min   GFR calc Af Amer >90  >90 mL/min  LIPASE, BLOOD      Result Value Ref Range   Lipase 22  11 - 59 U/L  URINALYSIS, ROUTINE W REFLEX MICROSCOPIC      Result Value Ref Range   Color, Urine YELLOW  YELLOW   APPearance CLEAR  CLEAR   Specific Gravity, Urine 1.010  1.005 - 1.030   pH 6.0  5.0 - 8.0   Glucose, UA NEGATIVE  NEGATIVE mg/dL   Hgb urine dipstick TRACE (*) NEGATIVE   Bilirubin Urine NEGATIVE  NEGATIVE   Ketones, ur NEGATIVE  NEGATIVE mg/dL   Protein, ur NEGATIVE  NEGATIVE mg/dL   Urobilinogen, UA 2.0 (*) 0.0 - 1.0 mg/dL   Nitrite NEGATIVE  NEGATIVE   Leukocytes, UA NEGATIVE  NEGATIVE  TROPONIN I      Result Value Ref Range   Troponin I <0.30  <0.30 ng/mL  URINE MICROSCOPIC-ADD ON      Result Value Ref Range   Squamous Epithelial / LPF RARE  RARE   WBC, UA 0-2  <3 WBC/hpf   RBC / HPF 0-2  <3 RBC/hpf   Bacteria, UA RARE  RARE    Dg Abd Acute W/chest  06/15/2013   CLINICAL DATA:  Abdominal pain  EXAM: ACUTE ABDOMEN SERIES (ABDOMEN 2 VIEW & CHEST 1 VIEW)  COMPARISON:  None.  FINDINGS: Cardiac shadow is within normal limits. The lungs are clear bilaterally.  The abdomen shows scattered large and small bowel gas. Contrast material related to recent CT is noted as well. No abnormal mass or abnormal calcifications are noted. No free air is seen. Bony structures are within normal limits.  IMPRESSION: No acute abnormality seen.   Electronically Signed   By: Inez Catalina M.D.   On: 06/15/2013 15:38      EKG  Interpretation   Date/Time:  Thursday June 15 2013 14:45:04 EDT Ventricular Rate:  80 PR Interval:  158 QRS Duration: 100 QT Interval:  388 QTC Calculation: 447 R Axis:   2 Text Interpretation:  Normal sinus rhythm Normal ECG When compared with  ECG of 12-Jun-2013 11:09, Nonspecific T wave abnormality now evident in  Inferior leads Nonspecific T wave abnormality, worse in Lateral leads  Confirmed by Brealynn Contino  MD, Ovid Curd [id number]) on 06/15/2013 4:10:57 PM      MDM   Final diagnoses:  Abdominal pain  Nausea vomiting and diarrhea    Patient with nausea vomiting upper abdominal pain. Also has had diarrhea. Seen in the ED recently for same and had negative CT. Patient does have a periumbilical hernia it does mostly reduced. He states it goes as flat as it ever does. Does not appear to be obstructed on x-ray. He feels somewhat better after treatment. Lab work is overall reassuring. He  has tolerated orals and would like to be discharged home. He'll be discharged with some pain meds and antibiotics. Patient states he thinks the Tylenol and Percocet is what was also became nauseous. He was given some oxycodone.  I personally performed the services described in this documentation, which was scribed in my presence. The recorded information has been reviewed and is accurate.    Jasper Riling. Alvino Chapel, MD 06/15/13 2212

## 2013-06-16 ENCOUNTER — Ambulatory Visit (HOSPITAL_COMMUNITY): Payer: Self-pay | Admitting: Psychiatry

## 2013-07-03 DIAGNOSIS — M47816 Spondylosis without myelopathy or radiculopathy, lumbar region: Secondary | ICD-10-CM | POA: Insufficient documentation

## 2013-07-03 DIAGNOSIS — M47812 Spondylosis without myelopathy or radiculopathy, cervical region: Secondary | ICD-10-CM | POA: Insufficient documentation

## 2013-07-04 ENCOUNTER — Ambulatory Visit: Payer: Self-pay | Attending: Internal Medicine | Admitting: Internal Medicine

## 2013-07-04 ENCOUNTER — Encounter: Payer: Self-pay | Admitting: Internal Medicine

## 2013-07-04 VITALS — BP 158/106 | HR 83 | Temp 98.0°F | Resp 16

## 2013-07-04 DIAGNOSIS — I10 Essential (primary) hypertension: Secondary | ICD-10-CM | POA: Insufficient documentation

## 2013-07-04 DIAGNOSIS — F32A Depression, unspecified: Secondary | ICD-10-CM

## 2013-07-04 DIAGNOSIS — F3289 Other specified depressive episodes: Secondary | ICD-10-CM | POA: Insufficient documentation

## 2013-07-04 DIAGNOSIS — F329 Major depressive disorder, single episode, unspecified: Secondary | ICD-10-CM

## 2013-07-04 DIAGNOSIS — M549 Dorsalgia, unspecified: Secondary | ICD-10-CM | POA: Insufficient documentation

## 2013-07-04 DIAGNOSIS — G894 Chronic pain syndrome: Secondary | ICD-10-CM | POA: Insufficient documentation

## 2013-07-04 DIAGNOSIS — Z09 Encounter for follow-up examination after completed treatment for conditions other than malignant neoplasm: Secondary | ICD-10-CM | POA: Insufficient documentation

## 2013-07-04 MED ORDER — ACETAMINOPHEN-CODEINE #3 300-30 MG PO TABS: 1.0000 | ORAL_TABLET | ORAL | Status: DC | PRN

## 2013-07-04 MED ORDER — LISINOPRIL-HYDROCHLOROTHIAZIDE 20-25 MG PO TABS: 1.0000 | ORAL_TABLET | Freq: Every day | ORAL | Status: DC

## 2013-07-04 NOTE — Progress Notes (Signed)
Patient ID: Oscar Hall, male   DOB: 1960-10-29, 53 y.o.   MRN: [id number]   Oscar Hall, is a 53 y.o. male  Oscar Hall:[id number]  Oscar Hall:[id number]  DOB - [birth year]  Chief Complaint  Patient presents with  . Follow-up        Subjective:   Oscar Hall is a 53 y.o. male here today for a follow up visit. Patient has history of uncontrolled hypertension, generalized body pain, chronic neck or back pain, dyslipidemia and PTSD with major depression. He is here today complaining of ongoing neck pain, he has been scheduled for surgery coming up but he needs pain medication pending the time of surgery. He is seeing therapist for his depression and PTSD. He is on hydrochlorothiazide 25 mg tablet by mouth daily for hypertension but his blood pressure has remained high, his wife endorses his blood pressure systolic of consistently above 170 at home and diastolic of 500/938 despite compliant with hydrochlorothiazide. He is on hydrocodone-acetaminophen for pain. Patient has No headache, No chest pain, No abdominal pain - No Nausea, No new weakness tingling or numbness, No Cough - SOB.  No problems updated.  ALLERGIES: Allergies  Allergen Reactions  . Celebrex [Celecoxib]   . Lithium     Increases blood pressure.  . Neurontin [Gabapentin]   . Other     Increases blood pressure **Visteril**  . Penicillins     Blisters on tongue  . Soma [Carisoprodol]   . Sulfa Antibiotics   . Toradol [Ketorolac Tromethamine]   . Tramadol     Tears his stomach up  . Lamictal [Lamotrigine] Hives and Rash    Increased blood pressure    PAST MEDICAL HISTORY: Past Medical History  Diagnosis Date  . PTSD (post-traumatic stress disorder)   . Coronary artery disease   . Hypercholesterolemia   . Fatty liver     MEDICATIONS AT HOME: Prior to Admission medications   Medication Sig Start Date End Date Taking? Authorizing Provider  ezetimibe-simvastatin (VYTORIN) 10-20 MG per tablet Take 1 tablet by mouth daily.  05/02/13  Yes Angelica Chessman, MD  hydrochlorothiazide (HYDRODIURIL) 25 MG tablet Take 0.5 tablets (12.5 mg total) by mouth daily. 04/17/13  Yes Debbe Odea, MD  oxyCODONE (ROXICODONE) 5 MG immediate release tablet Take 1 tablet (5 mg total) by mouth every 4 (four) hours as needed for severe pain. 06/15/13  Yes Nathan R. Alvino Chapel, MD  promethazine (PHENERGAN) 25 MG tablet Take 1 tablet (25 mg total) by mouth every 6 (six) hours as needed for nausea or vomiting. 06/12/13  Yes Mervin Kung, MD  acetaminophen-codeine (TYLENOL #3) 300-30 MG per tablet Take 1 tablet by mouth every 4 (four) hours as needed. 07/04/13   Angelica Chessman, MD  HYDROcodone-acetaminophen (NORCO/VICODIN) 5-325 MG per tablet Take 1-2 tablets by mouth every 6 (six) hours as needed for moderate pain. 06/12/13   Mervin Kung, MD  lisinopril-hydrochlorothiazide (PRINZIDE,ZESTORETIC) 20-25 MG per tablet Take 1 tablet by mouth daily. 07/04/13   Angelica Chessman, MD  promethazine (PHENERGAN) 25 MG tablet Take 1 tablet (25 mg total) by mouth every 6 (six) hours as needed for nausea. 06/15/13   Jasper Riling. Alvino Chapel, MD     Objective:   Filed Vitals:   07/04/13 1052  BP: 158/106  Pulse: 83  Temp: 98 F (36.7 C)  TempSrc: Oral  Resp: 16  SpO2: 96%    Exam General appearance : Awake, alert, not in any distress. Speech Clear. Not toxic looking HEENT: Atraumatic and Normocephalic, pupils equally  reactive to light and accomodation Neck: supple, no JVD. No cervical lymphadenopathy.  Chest:Good air entry bilaterally, no added sounds  CVS: S1 S2 regular, no murmurs.  Abdomen: Bowel sounds present, Non tender and not distended with no gaurding, rigidity or rebound. Extremities: B/L Lower Ext shows no edema, both legs are warm to touch Neurology: Awake alert, and oriented X 3, CN II-XII intact, Non focal Skin:No Rash Wounds:N/A  Data Review Lab Results  Component Value Date   HGBA1C 5.7* 02/13/2013     Assessment &  Plan   1. Depression Patient was counseled extensively He denied suicidal ideation or attempt  2. Chronic pain syndrome Prescribed - acetaminophen-codeine (TYLENOL #3) 300-30 MG per tablet; Take 1 tablet by mouth every 4 (four) hours as needed.  Dispense: 60 tablet; Refill: 0  3. HTN (hypertension): Uncontrolled We'll discontinue hydrochlorothiazide, start lisinopril-hydrochlorothiazide as prescribed  - lisinopril-hydrochlorothiazide (PRINZIDE,ZESTORETIC) 20-25 MG per tablet; Take 1 tablet by mouth daily.  Dispense: 90 tablet; Refill: 3 Patient has been counseled and educated on symptoms of low blood pressure, he is to report to the clinic immediately if blood pressure goes below 90/60 or develop symptoms of dizziness  Patient was counseled extensively on nutrition and exercise Patient was counseled extensively on smoking cessation, patient is not ready to quit at this time.   Return in about 3 months (around 10/03/2013), or if symptoms worsen or fail to improve, for Follow up Pain and comorbidities.  The patient was given clear instructions to go to ER or return to medical center if symptoms don't improve, worsen or new problems develop. The patient verbalized understanding. The patient was told to call to get lab results if they haven't heard anything in the next week.   This note has been created with Surveyor, quantity. Any transcriptional errors are unintentional.    Angelica Chessman, MD, [place], [place], [place] [facility] [place], [place]   07/04/2013, 11:18 AM

## 2013-07-04 NOTE — Patient Instructions (Signed)

## 2013-07-04 NOTE — Progress Notes (Signed)
Pt is here following up on his HTN and chronic back pain.

## 2013-08-07 ENCOUNTER — Ambulatory Visit: Payer: Self-pay | Attending: Internal Medicine

## 2013-08-19 ENCOUNTER — Encounter (HOSPITAL_COMMUNITY): Payer: Self-pay | Admitting: Emergency Medicine

## 2013-08-19 ENCOUNTER — Emergency Department (HOSPITAL_COMMUNITY)
Admission: EM | Admit: 2013-08-19 | Discharge: 2013-08-19 | Disposition: A | Discharge status: Home / Self Care | Payer: Self-pay | Attending: Emergency Medicine | Admitting: Emergency Medicine

## 2013-08-19 ENCOUNTER — Emergency Department (HOSPITAL_COMMUNITY): Payer: Self-pay

## 2013-08-19 DIAGNOSIS — R1110 Vomiting, unspecified: Secondary | ICD-10-CM | POA: Insufficient documentation

## 2013-08-19 DIAGNOSIS — I10 Essential (primary) hypertension: Secondary | ICD-10-CM | POA: Insufficient documentation

## 2013-08-19 DIAGNOSIS — R1013 Epigastric pain: Secondary | ICD-10-CM | POA: Insufficient documentation

## 2013-08-19 DIAGNOSIS — Z8659 Personal history of other mental and behavioral disorders: Secondary | ICD-10-CM | POA: Insufficient documentation

## 2013-08-19 DIAGNOSIS — Z8719 Personal history of other diseases of the digestive system: Secondary | ICD-10-CM | POA: Insufficient documentation

## 2013-08-19 DIAGNOSIS — I2510 Atherosclerotic heart disease of native coronary artery without angina pectoris: Secondary | ICD-10-CM | POA: Insufficient documentation

## 2013-08-19 DIAGNOSIS — Z79899 Other long term (current) drug therapy: Secondary | ICD-10-CM | POA: Insufficient documentation

## 2013-08-19 DIAGNOSIS — F17200 Nicotine dependence, unspecified, uncomplicated: Secondary | ICD-10-CM | POA: Insufficient documentation

## 2013-08-19 DIAGNOSIS — Z880 Allergy status to penicillin: Secondary | ICD-10-CM | POA: Insufficient documentation

## 2013-08-19 DIAGNOSIS — E7800 Pure hypercholesterolemia, unspecified: Secondary | ICD-10-CM | POA: Insufficient documentation

## 2013-08-19 DIAGNOSIS — R071 Chest pain on breathing: Secondary | ICD-10-CM | POA: Insufficient documentation

## 2013-08-19 HISTORY — DX: Pure hypercholesterolemia, unspecified: E78.00

## 2013-08-19 HISTORY — DX: Essential (primary) hypertension: I10

## 2013-08-19 LAB — COMPREHENSIVE METABOLIC PANEL
ALK PHOS: 101 U/L (ref 39–117)
ALT: 33 U/L (ref 0–53)
AST: 22 U/L (ref 0–37)
Albumin: 4.3 g/dL (ref 3.5–5.2)
BUN: 20 mg/dL (ref 6–23)
CHLORIDE: 95 meq/L — AB (ref 96–112)
CO2: 20 mEq/L (ref 19–32)
Calcium: 10.1 mg/dL (ref 8.4–10.5)
Creatinine, Ser: 1.1 mg/dL (ref 0.50–1.35)
GFR calc non Af Amer: 76 mL/min — ABNORMAL LOW (ref >90)
GFR, EST AFRICAN AMERICAN: 88 mL/min — AB (ref >90)
GLUCOSE: 123 mg/dL — AB (ref 70–99)
POTASSIUM: 3.8 meq/L (ref 3.7–5.3)
SODIUM: 137 meq/L (ref 137–147)
TOTAL PROTEIN: 8.5 g/dL — AB (ref 6.0–8.3)
Total Bilirubin: 0.8 mg/dL (ref 0.3–1.2)

## 2013-08-19 LAB — CBC WITH DIFFERENTIAL/PLATELET
Basophils Absolute: 0 10*3/uL (ref 0.0–0.1)
Basophils Relative: 0 % (ref 0–1)
EOS ABS: 0.2 10*3/uL (ref 0.0–0.7)
Eosinophils Relative: 1 % (ref 0–5)
HCT: 48.2 % (ref 39.0–52.0)
Hemoglobin: 17.4 g/dL — ABNORMAL HIGH (ref 13.0–17.0)
Lymphocytes Relative: 14 % (ref 12–46)
Lymphs Abs: 2.3 10*3/uL (ref 0.7–4.0)
MCH: 34 pg (ref 26.0–34.0)
MCHC: 36.1 g/dL — ABNORMAL HIGH (ref 30.0–36.0)
MCV: 94.1 fL (ref 78.0–100.0)
Monocytes Absolute: 0.7 10*3/uL (ref 0.1–1.0)
Monocytes Relative: 5 % (ref 3–12)
NEUTROS ABS: 12.4 10*3/uL — AB (ref 1.7–7.7)
NEUTROS PCT: 80 % — AB (ref 43–77)
PLATELETS: 278 10*3/uL (ref 150–400)
RBC: 5.12 MIL/uL (ref 4.22–5.81)
RDW: 13.5 % (ref 11.5–15.5)
WBC: 15.7 10*3/uL — AB (ref 4.0–10.5)

## 2013-08-19 LAB — URINE MICROSCOPIC-ADD ON

## 2013-08-19 LAB — URINALYSIS, ROUTINE W REFLEX MICROSCOPIC
Glucose, UA: NEGATIVE mg/dL
Ketones, ur: 15 mg/dL — AB
Leukocytes, UA: NEGATIVE
Nitrite: NEGATIVE
Protein, ur: 30 mg/dL — AB
Specific Gravity, Urine: >1.03 — ABNORMAL HIGH (ref 1.005–1.030)
Urobilinogen, UA: 4 mg/dL — ABNORMAL HIGH (ref 0.0–1.0)
pH: 6 (ref 5.0–8.0)

## 2013-08-19 LAB — LIPASE, BLOOD: Lipase: 33 U/L (ref 11–59)

## 2013-08-19 LAB — TROPONIN I: Troponin I: <0.3 ng/mL (ref <0.30)

## 2013-08-19 MED ORDER — ONDANSETRON HCL 4 MG/2ML IJ SOLN
4.0000 mg | Freq: Once | INTRAMUSCULAR | Status: AC
  Administered 2013-08-19: 4 mg via INTRAVENOUS
  Filled 2013-08-19: qty 2

## 2013-08-19 MED ORDER — MORPHINE SULFATE 4 MG/ML IJ SOLN
4.0000 mg | Freq: Once | INTRAMUSCULAR | Status: AC
  Administered 2013-08-19: 4 mg via INTRAVENOUS
  Filled 2013-08-19: qty 1

## 2013-08-19 MED ORDER — RANITIDINE HCL 150 MG PO CAPS: 150.0000 mg | ORAL_CAPSULE | Freq: Every day | ORAL | Status: DC

## 2013-08-19 MED ORDER — MORPHINE SULFATE 4 MG/ML IJ SOLN: 4.0000 mg | Freq: Once | INTRAMUSCULAR | Status: DC

## 2013-08-19 MED ORDER — PROMETHAZINE HCL 25 MG PO TABS: 25.0000 mg | ORAL_TABLET | Freq: Four times a day (QID) | ORAL | Status: DC | PRN

## 2013-08-19 MED ORDER — HYDROMORPHONE HCL PF 1 MG/ML IJ SOLN
1.0000 mg | Freq: Once | INTRAMUSCULAR | Status: AC
  Administered 2013-08-19: 1 mg via INTRAVENOUS
  Filled 2013-08-19: qty 1

## 2013-08-19 MED ORDER — SODIUM CHLORIDE 0.9 % IV BOLUS (SEPSIS)
1000.0000 mL | Freq: Once | INTRAVENOUS | Status: AC
  Administered 2013-08-19: 1000 mL via INTRAVENOUS

## 2013-08-19 MED ORDER — IOHEXOL 300 MG/ML  SOLN
100.0000 mL | Freq: Once | INTRAMUSCULAR | Status: AC | PRN
  Administered 2013-08-19: 100 mL via INTRAVENOUS

## 2013-08-19 MED ORDER — LORAZEPAM 2 MG/ML IJ SOLN
1.0000 mg | Freq: Once | INTRAMUSCULAR | Status: AC
  Administered 2013-08-19: 1 mg via INTRAVENOUS
  Filled 2013-08-19: qty 1

## 2013-08-19 MED ORDER — PANTOPRAZOLE SODIUM 40 MG IV SOLR
40.0000 mg | Freq: Once | INTRAVENOUS | Status: AC
  Administered 2013-08-19: 40 mg via INTRAVENOUS
  Filled 2013-08-19: qty 40

## 2013-08-19 MED ORDER — PROMETHAZINE HCL 25 MG/ML IJ SOLN
25.0000 mg | Freq: Once | INTRAMUSCULAR | Status: AC
  Administered 2013-08-19: 25 mg via INTRAVENOUS
  Filled 2013-08-19: qty 1

## 2013-08-19 MED ORDER — HYDROMORPHONE HCL 4 MG PO TABS: 4.0000 mg | ORAL_TABLET | Freq: Four times a day (QID) | ORAL | Status: DC | PRN

## 2013-08-19 NOTE — ED Notes (Signed)
Family states pt was walking out from movie & by the time he got to car he could not breath. Pt w/ dry heaves & vomiting. Pt holding to his left upper quadrant. Pt states hurting from vomiting he guessed.

## 2013-08-19 NOTE — Discharge Instructions (Signed)
Follow up if not improving

## 2013-08-19 NOTE — ED Notes (Signed)
Patient complaining of emesis and shortness of breath that started all of a sudden at 1400. Complaining of left rib pain. Patient very anxious during triage.

## 2013-08-19 NOTE — ED Notes (Signed)
Pt states that morphine does not do anything for his pain before administering.

## 2013-08-19 NOTE — ED Provider Notes (Signed)
CSN: [id number]     Arrival date & time 08/19/13  1921 History   First MD Initiated Contact with Patient 08/19/13 1927     Chief Complaint  Patient presents with  . Shortness of Breath  . Emesis     (Consider location/radiation/quality/duration/timing/severity/associated sxs/prior Treatment) Patient is a 53 y.o. male presenting with vomiting. The history is provided by the patient (pt complains of vomiting and left chest pain with movement).  Emesis Severity:  Severe Timing:  Constant Quality:  Bilious material Able to tolerate:  Liquids Progression:  Unchanged Chronicity:  Recurrent Recent urination:  Normal Relieved by:  Nothing Associated symptoms: no abdominal pain, no diarrhea and no headaches     Past Medical History  Diagnosis Date  . PTSD (post-traumatic stress disorder)   . Coronary artery disease   . Hypercholesterolemia   . Fatty liver   . Hypercholesteremia   . Hypertension    Past Surgical History  Procedure Laterality Date  . Throat surgery      traumatic injury  . Hand surgery    . Knee surgery     Family History  Problem Relation Age of Onset  . Alcohol abuse Father   . Anxiety disorder Father   . Depression Father   . Coronary artery disease Father   . Alcohol abuse Brother   . Coronary artery disease Brother   . Schizophrenia Neg Hx   . Diabetes Mellitus II Neg Hx   . Drug abuse Brother   . Depression Brother 61    Commited suicide    History  Substance Use Topics  . Smoking status: Current Every Day Smoker -- 1.25 packs/day for 30 years  . Smokeless tobacco: Not on file  . Alcohol Use: No     Comment: Quit drinking. 2-3 fifths, binge drinking.    Review of Systems  Constitutional: Negative for appetite change and fatigue.  HENT: Negative for congestion, ear discharge and sinus pressure.   Eyes: Negative for discharge.  Respiratory: Negative for cough.   Cardiovascular: Negative for chest pain.  Gastrointestinal: Positive for  vomiting. Negative for abdominal pain and diarrhea.  Genitourinary: Negative for frequency and hematuria.  Musculoskeletal: Negative for back pain.  Skin: Negative for rash.  Neurological: Negative for seizures and headaches.  Psychiatric/Behavioral: Negative for hallucinations.      Allergies  Vistaril; Celebrex; Lithium; Neurontin; Other; Penicillins; Soma; Sulfa antibiotics; Toradol; Tramadol; and Lamictal  Home Medications   Prior to Admission medications   Medication Sig Start Date End Date Taking? Authorizing Provider  ezetimibe-simvastatin (VYTORIN) 10-10 MG per tablet Take 1 tablet by mouth at bedtime.   Yes Historical Provider, MD  HYDROcodone-acetaminophen (NORCO) 5-325 MG per tablet Take 1 tablet by mouth every 8 (eight) hours as needed. For up to 30 days 08/03/13 09/02/13 Yes Historical Provider, MD  lisinopril-hydrochlorothiazide (PRINZIDE,ZESTORETIC) 20-25 MG per tablet Take 0.5 tablets by mouth every morning.   Yes Historical Provider, MD   BP 101/69  Pulse 96  Temp(Src) 97.8 F (36.6 C) (Oral)  Resp 17  Ht 5\' 11"  (1.803 m)  Wt 255 lb (115.667 kg)  BMI 35.58 kg/m2  SpO2 95% Physical Exam  Constitutional: He is oriented to person, place, and time. He appears well-developed.  HENT:  Head: Normocephalic.  Eyes: Conjunctivae and EOM are normal. No scleral icterus.  Neck: Neck supple. No thyromegaly present.  Cardiovascular: Normal rate and regular rhythm.  Exam reveals no gallop and no friction rub.   No murmur heard. Pulmonary/Chest: No  stridor. He has no wheezes. He has no rales. He exhibits tenderness.  Left ant wall tender  Abdominal: He exhibits no distension. There is tenderness. There is no rebound.  Mild epigastric tender  Musculoskeletal: Normal range of motion. He exhibits no edema.  Lymphadenopathy:    He has no cervical adenopathy.  Neurological: He is oriented to person, place, and time. He exhibits normal muscle tone. Coordination normal.  Skin: No  rash noted. No erythema.  Psychiatric: He has a normal mood and affect. His behavior is normal.    ED Course  Procedures (including critical care time) Labs Review Labs Reviewed  CBC WITH DIFFERENTIAL - Abnormal; Notable for the following:    WBC 15.7 (*)    Hemoglobin 17.4 (*)    MCHC 36.1 (*)    Neutrophils Relative % 80 (*)    Neutro Abs 12.4 (*)    All other components within normal limits  COMPREHENSIVE METABOLIC PANEL - Abnormal; Notable for the following:    Chloride 95 (*)    Glucose, Bld 123 (*)    Total Protein 8.5 (*)    GFR calc non Af Amer 76 (*)    GFR calc Af Amer 88 (*)    All other components within normal limits  URINALYSIS, ROUTINE W REFLEX MICROSCOPIC - Abnormal; Notable for the following:    Color, Urine AMBER (*)    Specific Gravity, Urine >1.030 (*)    Hgb urine dipstick TRACE (*)    Bilirubin Urine SMALL (*)    Ketones, ur 15 (*)    Protein, ur 30 (*)    Urobilinogen, UA 4.0 (*)    All other components within normal limits  TROPONIN I  LIPASE, BLOOD  URINE MICROSCOPIC-ADD ON    Imaging Review Ct Abdomen Pelvis W Contrast  08/19/2013   CLINICAL DATA:  53 year old male with left upper quadrant pain nausea and vomiting. Initial encounter.  EXAM: CT ABDOMEN AND PELVIS WITH CONTRAST  TECHNIQUE: Multidetector CT imaging of the abdomen and pelvis was performed using the standard protocol following bolus administration of intravenous contrast.  CONTRAST:  160mL OMNIPAQUE IOHEXOL 300 MG/ML  SOLN  COMPARISON:  CT Abdomen and Pelvis 06/12/2013.  FINDINGS: Tiny calcified granulomas clustered about a distal airway in the right lower lobe (series 3, images 2-4). Otherwise negative lung bases. No pericardial or pleural effusion.  Stable visualized osseous structures. Degenerative changes in the lower lumbar spine.  No pelvic free fluid. Negative distal colon. Negative left colon and splenic flexure. Negative transverse colon, right colon and appendix. No dilated small  bowel. . Decompressed stomach and duodenum.  Stable and negative liver, gallbladder, spleen, pancreas, adrenal glands and portal venous system. Major arterial structures in the abdomen and pelvis are patent. Aortoiliac calcified atherosclerosis noted. Kidneys are stable and within normal limits. No abdominal free fluid or lymphadenopathy.  IMPRESSION: 1. No acute findings in the abdomen or pelvis. 2. Stable left paraumbilical fat containing hernia.   Electronically Signed   By: Lars Pinks M.D.   On: 08/19/2013 21:31   Dg Chest Portable 1 View  08/19/2013   CLINICAL DATA:  Emesis, shortness of breath, left rib pain  EXAM: PORTABLE CHEST - 1 VIEW  COMPARISON:  None.  FINDINGS: The heart size and mediastinal contours are within normal limits. Both lungs are clear. The visualized skeletal structures are unremarkable.  IMPRESSION: No active disease.   Electronically Signed   By: Kathreen Devoid   On: 08/19/2013 20:15  EKG Interpretation   Date/Time:  Saturday Aug 19 2013 20:20:57 EDT Ventricular Rate:  100 PR Interval:  135 QRS Duration: 91 QT Interval:  366 QTC Calculation: 472 R Axis:   23 Text Interpretation:  Sinus tachycardia Borderline T wave abnormalities  Confirmed by Ednamae Schiano  MD, Grant Swager [phone]) on 08/19/2013 9:35:57 PM      MDM   Final diagnoses:  None     Spoke with the hospitalist and we will tx the pt as out pt for vomiting and gastritits  Maudry Diego, MD 08/19/13 2154

## 2013-08-19 NOTE — ED Notes (Signed)
Pt alert & oriented x4, stable gait. Patient given discharge instructions, paperwork & prescription(s). Patient  instructed to stop at the registration desk to finish any additional paperwork. Patient verbalized understanding. Pt left department w/ no further questions. 

## 2013-08-23 ENCOUNTER — Ambulatory Visit (HOSPITAL_COMMUNITY): Payer: Self-pay | Admitting: Psychiatry

## 2013-08-31 DIAGNOSIS — Z9889 Other specified postprocedural states: Secondary | ICD-10-CM | POA: Insufficient documentation

## 2013-09-07 DIAGNOSIS — S42153A Displaced fracture of neck of scapula, unspecified shoulder, initial encounter for closed fracture: Secondary | ICD-10-CM

## 2013-09-07 DIAGNOSIS — S40029A Contusion of unspecified upper arm, initial encounter: Secondary | ICD-10-CM

## 2013-09-07 DIAGNOSIS — S39012A Strain of muscle, fascia and tendon of lower back, initial encounter: Secondary | ICD-10-CM | POA: Insufficient documentation

## 2013-09-07 DIAGNOSIS — S42143A Displaced fracture of glenoid cavity of scapula, unspecified shoulder, initial encounter for closed fracture: Secondary | ICD-10-CM | POA: Insufficient documentation

## 2013-09-07 DIAGNOSIS — S40019A Contusion of unspecified shoulder, initial encounter: Secondary | ICD-10-CM | POA: Insufficient documentation

## 2013-09-14 ENCOUNTER — Emergency Department (HOSPITAL_COMMUNITY): Payer: Self-pay

## 2013-09-14 ENCOUNTER — Emergency Department (HOSPITAL_COMMUNITY)
Admission: EM | Admit: 2013-09-14 | Discharge: 2013-09-14 | Disposition: A | Discharge status: Home / Self Care | Payer: Self-pay | Attending: Emergency Medicine | Admitting: Emergency Medicine

## 2013-09-14 ENCOUNTER — Encounter (HOSPITAL_COMMUNITY): Payer: Self-pay | Admitting: Emergency Medicine

## 2013-09-14 DIAGNOSIS — K429 Umbilical hernia without obstruction or gangrene: Secondary | ICD-10-CM | POA: Insufficient documentation

## 2013-09-14 DIAGNOSIS — F17200 Nicotine dependence, unspecified, uncomplicated: Secondary | ICD-10-CM | POA: Insufficient documentation

## 2013-09-14 DIAGNOSIS — R112 Nausea with vomiting, unspecified: Secondary | ICD-10-CM | POA: Insufficient documentation

## 2013-09-14 DIAGNOSIS — Z880 Allergy status to penicillin: Secondary | ICD-10-CM | POA: Insufficient documentation

## 2013-09-14 DIAGNOSIS — Z79899 Other long term (current) drug therapy: Secondary | ICD-10-CM | POA: Insufficient documentation

## 2013-09-14 DIAGNOSIS — I2510 Atherosclerotic heart disease of native coronary artery without angina pectoris: Secondary | ICD-10-CM | POA: Insufficient documentation

## 2013-09-14 DIAGNOSIS — Z8659 Personal history of other mental and behavioral disorders: Secondary | ICD-10-CM | POA: Insufficient documentation

## 2013-09-14 DIAGNOSIS — R000 Tachycardia, unspecified: Secondary | ICD-10-CM | POA: Insufficient documentation

## 2013-09-14 DIAGNOSIS — Z8719 Personal history of other diseases of the digestive system: Secondary | ICD-10-CM | POA: Insufficient documentation

## 2013-09-14 DIAGNOSIS — R0602 Shortness of breath: Secondary | ICD-10-CM | POA: Insufficient documentation

## 2013-09-14 DIAGNOSIS — I10 Essential (primary) hypertension: Secondary | ICD-10-CM | POA: Insufficient documentation

## 2013-09-14 DIAGNOSIS — R109 Unspecified abdominal pain: Secondary | ICD-10-CM

## 2013-09-14 DIAGNOSIS — R1011 Right upper quadrant pain: Secondary | ICD-10-CM | POA: Insufficient documentation

## 2013-09-14 LAB — HEPATIC FUNCTION PANEL
ALT: 38 U/L (ref 0–53)
AST: 29 U/L (ref 0–37)
Albumin: 4.2 g/dL (ref 3.5–5.2)
Alkaline Phosphatase: 98 U/L (ref 39–117)
Bilirubin, Direct: 0.2 mg/dL (ref 0.0–0.3)
Indirect Bilirubin: 0.7 mg/dL (ref 0.3–0.9)
TOTAL PROTEIN: 8.4 g/dL — AB (ref 6.0–8.3)
Total Bilirubin: 0.9 mg/dL (ref 0.3–1.2)

## 2013-09-14 LAB — CBC WITH DIFFERENTIAL/PLATELET
Basophils Absolute: 0 10*3/uL (ref 0.0–0.1)
Basophils Relative: 0 % (ref 0–1)
Eosinophils Absolute: 0.2 10*3/uL (ref 0.0–0.7)
Eosinophils Relative: 1 % (ref 0–5)
HCT: 49.1 % (ref 39.0–52.0)
Hemoglobin: 18 g/dL — ABNORMAL HIGH (ref 13.0–17.0)
LYMPHS PCT: 24 % (ref 12–46)
Lymphs Abs: 3.3 10*3/uL (ref 0.7–4.0)
MCH: 34.4 pg — ABNORMAL HIGH (ref 26.0–34.0)
MCHC: 36.7 g/dL — ABNORMAL HIGH (ref 30.0–36.0)
MCV: 93.7 fL (ref 78.0–100.0)
Monocytes Absolute: 1.1 10*3/uL — ABNORMAL HIGH (ref 0.1–1.0)
Monocytes Relative: 8 % (ref 3–12)
NEUTROS ABS: 9.1 10*3/uL — AB (ref 1.7–7.7)
NEUTROS PCT: 66 % (ref 43–77)
Platelets: 274 10*3/uL (ref 150–400)
RBC: 5.24 MIL/uL (ref 4.22–5.81)
RDW: 13 % (ref 11.5–15.5)
WBC: 13.7 10*3/uL — ABNORMAL HIGH (ref 4.0–10.5)

## 2013-09-14 LAB — TROPONIN I
Troponin I: <0.3 ng/mL (ref <0.30)
Troponin I: <0.3 ng/mL (ref <0.30)

## 2013-09-14 LAB — BASIC METABOLIC PANEL
BUN: 13 mg/dL (ref 6–23)
CO2: 22 meq/L (ref 19–32)
Calcium: 10.2 mg/dL (ref 8.4–10.5)
Chloride: 94 mEq/L — ABNORMAL LOW (ref 96–112)
Creatinine, Ser: 0.87 mg/dL (ref 0.50–1.35)
GFR calc Af Amer: >90 mL/min (ref >90)
GFR calc non Af Amer: >90 mL/min (ref >90)
GLUCOSE: 138 mg/dL — AB (ref 70–99)
Potassium: 3 mEq/L — ABNORMAL LOW (ref 3.7–5.3)
SODIUM: 135 meq/L — AB (ref 137–147)

## 2013-09-14 LAB — LIPASE, BLOOD: Lipase: 67 U/L — ABNORMAL HIGH (ref 11–59)

## 2013-09-14 MED ORDER — HYDROMORPHONE HCL PF 1 MG/ML IJ SOLN
1.0000 mg | Freq: Once | INTRAMUSCULAR | Status: AC
  Administered 2013-09-14: 1 mg via INTRAVENOUS
  Filled 2013-09-14: qty 1

## 2013-09-14 MED ORDER — POTASSIUM CHLORIDE 10 MEQ/100ML IV SOLN
10.0000 meq | Freq: Once | INTRAVENOUS | Status: AC
  Administered 2013-09-14: 10 meq via INTRAVENOUS
  Filled 2013-09-14: qty 100

## 2013-09-14 MED ORDER — ONDANSETRON 4 MG PO TBDP: 4.0000 mg | ORAL_TABLET | Freq: Three times a day (TID) | ORAL | Status: DC | PRN

## 2013-09-14 MED ORDER — LORAZEPAM 2 MG/ML IJ SOLN
1.0000 mg | Freq: Once | INTRAMUSCULAR | Status: AC
  Administered 2013-09-14: 1 mg via INTRAVENOUS
  Filled 2013-09-14: qty 1

## 2013-09-14 MED ORDER — HYDROMORPHONE HCL 4 MG PO TABS: 4.0000 mg | ORAL_TABLET | Freq: Four times a day (QID) | ORAL | Status: DC | PRN

## 2013-09-14 MED ORDER — PROMETHAZINE HCL 25 MG PO TABS: 25.0000 mg | ORAL_TABLET | Freq: Four times a day (QID) | ORAL | Status: DC | PRN

## 2013-09-14 MED ORDER — IOHEXOL 300 MG/ML  SOLN
50.0000 mL | Freq: Once | INTRAMUSCULAR | Status: AC | PRN
  Administered 2013-09-14: 50 mL via ORAL

## 2013-09-14 MED ORDER — POTASSIUM CHLORIDE ER 10 MEQ PO TBCR: 10.0000 meq | EXTENDED_RELEASE_TABLET | Freq: Two times a day (BID) | ORAL | Status: DC

## 2013-09-14 MED ORDER — IOHEXOL 300 MG/ML  SOLN
100.0000 mL | Freq: Once | INTRAMUSCULAR | Status: AC | PRN
  Administered 2013-09-14: 100 mL via INTRAVENOUS

## 2013-09-14 NOTE — ED Notes (Signed)
Pt c/o CP and rib pain for past 2 days, + SOB and vomiting per pt, recent left shoulder pain

## 2013-09-14 NOTE — Discharge Instructions (Signed)
Abdominal Pain Many things can cause abdominal pain. Usually, abdominal pain is not caused by a disease and will improve without treatment. It can often be observed and treated at home. Your health care provider will do a physical exam and possibly order blood tests and X-rays to help determine the seriousness of your pain. However, in many cases, more time must pass before a clear cause of the pain can be found. Before that point, your health care provider may not know if you need more testing or further treatment. HOME CARE INSTRUCTIONS  Monitor your abdominal pain for any changes. The following actions may help to alleviate any discomfort you are experiencing:  Only take over-the-counter or prescription medicines as directed by your health care provider.  Do not take laxatives unless directed to do so by your health care provider.  Try a clear liquid diet (broth, tea, or water) as directed by your health care provider. Slowly move to a bland diet as tolerated. SEEK MEDICAL CARE IF:  You have unexplained abdominal pain.  You have abdominal pain associated with nausea or diarrhea.  You have pain when you urinate or have a bowel movement.  You experience abdominal pain that wakes you in the night.  You have abdominal pain that is worsened or improved by eating food.  You have abdominal pain that is worsened with eating fatty foods.  You have a fever. SEEK IMMEDIATE MEDICAL CARE IF:   Your pain does not go away within 2 hours.  You keep throwing up (vomiting).  Your pain is felt only in portions of the abdomen, such as the right side or the left lower portion of the abdomen.  You pass bloody or black tarry stools. MAKE SURE YOU:  Understand these instructions.   Will watch your condition.   Will get help right away if you are not doing well or get worse.  Document Released: 12/17/2004 Document Revised: 03/14/2013 Document Reviewed: 11/16/2012 [facility] Patient Information  2015 [place], [place]. This information is not intended to replace advice given to you by your health care provider. Make sure you discuss any questions you have with your health care provider.  Workup without any specific findings. Potassium was a little low at 3 take the potassium supplements for the next 2 days as directed. Take pain medicine as needed. Take antinausea medicine Phenergan and/or Zofran as needed for nausea and vomiting. Make a point to follow up with your regular doctor. Return for new or worse symptoms.

## 2013-09-14 NOTE — ED Provider Notes (Signed)
CSN: [id number]     Arrival date & time 09/14/13  1204 History  This chart was scribed for Maudry Diego, MD by Cathie Hoops, ED Scribe. The patient was seen in [facility]14[facility]14. The patient's care was started at 12:16 PM.   Chief Complaint  Patient presents with  . Chest Pain   Patient is a 53 y.o. male presenting with abdominal pain. The history is provided by the patient. No language interpreter was used.  Abdominal Pain Pain location:  Epigastric and RUQ Pain quality comment:  Grabbing Pain radiates to:  Does not radiate Duration:  2 days Progression:  Worsening Chronicity:  New Associated symptoms: nausea, shortness of breath and vomiting   Associated symptoms: no chest pain, no cough, no diarrhea, no fatigue and no hematuria     HPI Comments: Oscar Hall is a 53 y.o. male who presents to the Emergency Department complaining of non-radiating, severe, constant epigastric and RUQ abdominal pain that has lasted for 2 days and worsened suddenly in the last 30 minutes. He describes pain as a "grabbing" sensation. He states that he has a prior history of similar abdominal pain. The patient reports associated symptoms of vomiting,and nausea, shortness of breath. He has a history of CAD, hypercholesterolemia, fatty liver, and HTN.   Past Medical History  Diagnosis Date  . PTSD (post-traumatic stress disorder)   . Coronary artery disease   . Hypercholesterolemia   . Fatty liver   . Hypercholesteremia   . Hypertension    Past Surgical History  Procedure Laterality Date  . Throat surgery      traumatic injury  . Hand surgery    . Knee surgery    . Shoulder surgery     Family History  Problem Relation Age of Onset  . Alcohol abuse Father   . Anxiety disorder Father   . Depression Father   . Coronary artery disease Father   . Alcohol abuse Brother   . Coronary artery disease Brother   . Schizophrenia Neg Hx   . Diabetes Mellitus II Neg Hx   . Drug abuse Brother   . Depression  Brother 72    Commited suicide    History  Substance Use Topics  . Smoking status: Current Every Day Smoker -- 1.25 packs/day for 30 years  . Smokeless tobacco: Not on file  . Alcohol Use: No     Comment: Quit drinking. 2-3 fifths, binge drinking.    Review of Systems  Constitutional: Negative for appetite change and fatigue.  HENT: Negative for congestion, ear discharge and sinus pressure.   Eyes: Negative for discharge.  Respiratory: Positive for shortness of breath. Negative for cough.   Cardiovascular: Negative for chest pain.  Gastrointestinal: Positive for nausea, vomiting and abdominal pain. Negative for diarrhea.  Genitourinary: Negative for frequency and hematuria.  Musculoskeletal: Negative for back pain.  Skin: Negative for rash.  Neurological: Negative for seizures and headaches.  Psychiatric/Behavioral: Negative for hallucinations.   Allergies  Vistaril; Celebrex; Lithium; Neurontin; Other; Penicillins; Soma; Sulfa antibiotics; Toradol; Tramadol; and Lamictal  Home Medications   Prior to Admission medications   Medication Sig Start Date End Date Taking? Authorizing Provider  ezetimibe-simvastatin (VYTORIN) 10-10 MG per tablet Take 1 tablet by mouth at bedtime.    Historical Provider, MD  HYDROmorphone (DILAUDID) 4 MG tablet Take 1 tablet (4 mg total) by mouth every 6 (six) hours as needed for severe pain. 08/19/13   Maudry Diego, MD  lisinopril-hydrochlorothiazide (PRINZIDE,ZESTORETIC) 20-25 MG per tablet  Take 0.5 tablets by mouth every morning.    Historical Provider, MD  promethazine (PHENERGAN) 25 MG tablet Take 1 tablet (25 mg total) by mouth every 6 (six) hours as needed for nausea or vomiting. 08/19/13   Maudry Diego, MD  ranitidine (ZANTAC) 150 MG capsule Take 1 capsule (150 mg total) by mouth daily. 08/19/13   Maudry Diego, MD   Triage Vitals: BP 132/102  Temp(Src) 97.8 F (36.6 C) (Oral)  Resp 20  Ht 5\' 11"  (1.803 m)  Wt 255 lb (115.667 kg)  BMI  35.58 kg/m2  SpO2 100% Physical Exam  Constitutional: He is oriented to person, place, and time. He appears well-developed.  HENT:  Head: Normocephalic.  Eyes: Conjunctivae and EOM are normal. No scleral icterus.  Neck: Neck supple. No thyromegaly present.  Cardiovascular: Tachycardia present.  Exam reveals no gallop and no friction rub.   No murmur heard. Tachycardia.  Pulmonary/Chest: No stridor. He has wheezes. He has no rales. He exhibits no tenderness.  Mild wheezing bilaterally.  Abdominal: He exhibits no distension. There is tenderness. There is no rebound.  Severe epigastric and RUQ tenderness. Small umbilical hernia.   Musculoskeletal: Normal range of motion. He exhibits no edema.  Lymphadenopathy:    He has no cervical adenopathy.  Neurological: He is oriented to person, place, and time. He exhibits normal muscle tone. Coordination normal.  Skin: No rash noted. No erythema.  Psychiatric: He has a normal mood and affect. His behavior is normal.    ED Course  Procedures (including critical care time) DIAGNOSTIC STUDIES: Oxygen Saturation is 100% on room air, normal by my interpretation.    COORDINATION OF CARE: 12:19 PM- Will order Dilaudid and Ativan. Patient informed of current plan for treatment and evaluation and agrees with plan at this time.  Labs Review Labs Reviewed  CBC WITH DIFFERENTIAL  TROPONIN I  BASIC METABOLIC PANEL  HEPATIC FUNCTION PANEL  LIPASE, BLOOD    Imaging Review No results found.   EKG Interpretation   Date/Time:  Thursday September 14 2013 12:10:58 EDT Ventricular Rate:  122 PR Interval:  142 QRS Duration: 89 QT Interval:  332 QTC Calculation: 473 R Axis:   2 Text Interpretation:  Sinus tachycardia Confirmed by ZAMMIT  MD, JOSEPH  [phone]) on 09/14/2013 4:24:38 PM      MDM   Final diagnoses:  None  The chart was scribed for me under my direct supervision.  I personally performed the history, physical, and medical decision making  and all procedures in the evaluation of this patient.Maudry Diego, MD 09/14/13 [phone]

## 2013-09-14 NOTE — Care Management Note (Signed)
ED/CM noted patient did not have health insurance and/or PCP listed in the computer.  Patient was given the [place] resource handout with information on the clinics, food pantries, and the handout for new health insurance sign-up.  Patient expressed appreciation for information received. 

## 2013-09-14 NOTE — ED Notes (Signed)
Pt requesting more pain medication. Dr. Rogene Houston aware. New order

## 2013-09-14 NOTE — ED Notes (Signed)
Pt dc home by T. Dalbert Batman RN

## 2013-09-24 ENCOUNTER — Emergency Department (HOSPITAL_COMMUNITY)
Admission: EM | Admit: 2013-09-24 | Discharge: 2013-09-24 | Disposition: A | Discharge status: Home / Self Care | Payer: Self-pay | Attending: Emergency Medicine | Admitting: Emergency Medicine

## 2013-09-24 ENCOUNTER — Encounter (HOSPITAL_COMMUNITY): Payer: Self-pay | Admitting: Emergency Medicine

## 2013-09-24 DIAGNOSIS — I10 Essential (primary) hypertension: Secondary | ICD-10-CM | POA: Insufficient documentation

## 2013-09-24 DIAGNOSIS — R109 Unspecified abdominal pain: Secondary | ICD-10-CM | POA: Insufficient documentation

## 2013-09-24 DIAGNOSIS — I2510 Atherosclerotic heart disease of native coronary artery without angina pectoris: Secondary | ICD-10-CM | POA: Insufficient documentation

## 2013-09-24 DIAGNOSIS — Z880 Allergy status to penicillin: Secondary | ICD-10-CM | POA: Insufficient documentation

## 2013-09-24 DIAGNOSIS — F17200 Nicotine dependence, unspecified, uncomplicated: Secondary | ICD-10-CM | POA: Insufficient documentation

## 2013-09-24 DIAGNOSIS — Z8719 Personal history of other diseases of the digestive system: Secondary | ICD-10-CM | POA: Insufficient documentation

## 2013-09-24 DIAGNOSIS — G8929 Other chronic pain: Secondary | ICD-10-CM | POA: Insufficient documentation

## 2013-09-24 DIAGNOSIS — Z79899 Other long term (current) drug therapy: Secondary | ICD-10-CM | POA: Insufficient documentation

## 2013-09-24 LAB — COMPREHENSIVE METABOLIC PANEL
ALBUMIN: 3.6 g/dL (ref 3.5–5.2)
ALK PHOS: 77 U/L (ref 39–117)
ALT: 39 U/L (ref 0–53)
AST: 27 U/L (ref 0–37)
Anion gap: 11 (ref 5–15)
BILIRUBIN TOTAL: 0.4 mg/dL (ref 0.3–1.2)
BUN: 9 mg/dL (ref 6–23)
CHLORIDE: 101 meq/L (ref 96–112)
CO2: 26 meq/L (ref 19–32)
Calcium: 9.3 mg/dL (ref 8.4–10.5)
Creatinine, Ser: 0.9 mg/dL (ref 0.50–1.35)
GFR calc Af Amer: >90 mL/min (ref >90)
GFR calc non Af Amer: >90 mL/min (ref >90)
Glucose, Bld: 100 mg/dL — ABNORMAL HIGH (ref 70–99)
POTASSIUM: 3.7 meq/L (ref 3.7–5.3)
Sodium: 138 mEq/L (ref 137–147)
Total Protein: 7.1 g/dL (ref 6.0–8.3)

## 2013-09-24 LAB — CBC WITH DIFFERENTIAL/PLATELET
BASOS PCT: 1 % (ref 0–1)
Basophils Absolute: 0.1 10*3/uL (ref 0.0–0.1)
Eosinophils Absolute: 0.8 10*3/uL — ABNORMAL HIGH (ref 0.0–0.7)
Eosinophils Relative: 7 % — ABNORMAL HIGH (ref 0–5)
HCT: 41.8 % (ref 39.0–52.0)
Hemoglobin: 15 g/dL (ref 13.0–17.0)
LYMPHS PCT: 25 % (ref 12–46)
Lymphs Abs: 2.6 10*3/uL (ref 0.7–4.0)
MCH: 33.9 pg (ref 26.0–34.0)
MCHC: 35.9 g/dL (ref 30.0–36.0)
MCV: 94.4 fL (ref 78.0–100.0)
Monocytes Absolute: 0.6 10*3/uL (ref 0.1–1.0)
Monocytes Relative: 5 % (ref 3–12)
NEUTROS PCT: 62 % (ref 43–77)
Neutro Abs: 6.4 10*3/uL (ref 1.7–7.7)
Platelets: 213 10*3/uL (ref 150–400)
RBC: 4.43 MIL/uL (ref 4.22–5.81)
RDW: 12.7 % (ref 11.5–15.5)
WBC: 10.4 10*3/uL (ref 4.0–10.5)

## 2013-09-24 LAB — URINALYSIS, ROUTINE W REFLEX MICROSCOPIC
BILIRUBIN URINE: NEGATIVE
Glucose, UA: NEGATIVE mg/dL
HGB URINE DIPSTICK: NEGATIVE
Ketones, ur: NEGATIVE mg/dL
Leukocytes, UA: NEGATIVE
Nitrite: NEGATIVE
Protein, ur: NEGATIVE mg/dL
Specific Gravity, Urine: <1.005 — ABNORMAL LOW (ref 1.005–1.030)
UROBILINOGEN UA: 0.2 mg/dL (ref 0.0–1.0)
pH: 6.5 (ref 5.0–8.0)

## 2013-09-24 LAB — I-STAT TROPONIN, ED: TROPONIN I, POC: 0.01 ng/mL (ref 0.00–0.08)

## 2013-09-24 LAB — LIPASE, BLOOD: Lipase: 40 U/L (ref 11–59)

## 2013-09-24 MED ORDER — ONDANSETRON 8 MG PO TBDP: ORAL_TABLET | ORAL | Status: DC

## 2013-09-24 MED ORDER — DIPHENHYDRAMINE HCL 50 MG/ML IJ SOLN
50.0000 mg | Freq: Once | INTRAMUSCULAR | Status: AC
  Administered 2013-09-24: 50 mg via INTRAVENOUS
  Filled 2013-09-24: qty 1

## 2013-09-24 MED ORDER — METOCLOPRAMIDE HCL 10 MG PO TABS: 10.0000 mg | ORAL_TABLET | Freq: Four times a day (QID) | ORAL | Status: DC | PRN

## 2013-09-24 MED ORDER — OXYCODONE HCL 5 MG PO TABS: 5.0000 mg | ORAL_TABLET | Freq: Four times a day (QID) | ORAL | Status: DC | PRN

## 2013-09-24 MED ORDER — HYDROMORPHONE HCL PF 1 MG/ML IJ SOLN
1.0000 mg | Freq: Once | INTRAMUSCULAR | Status: AC
  Administered 2013-09-24: 1 mg via INTRAVENOUS
  Filled 2013-09-24: qty 1

## 2013-09-24 MED ORDER — SODIUM CHLORIDE 0.9 % IV BOLUS (SEPSIS)
2000.0000 mL | Freq: Once | INTRAVENOUS | Status: AC
  Administered 2013-09-24: 2000 mL via INTRAVENOUS

## 2013-09-24 MED ORDER — METOCLOPRAMIDE HCL 5 MG/ML IJ SOLN
10.0000 mg | Freq: Once | INTRAMUSCULAR | Status: AC
  Administered 2013-09-24: 10 mg via INTRAVENOUS
  Filled 2013-09-24: qty 2

## 2013-09-24 MED ORDER — LORAZEPAM 2 MG/ML IJ SOLN
1.0000 mg | Freq: Once | INTRAMUSCULAR | Status: AC
  Administered 2013-09-24: 1 mg via INTRAVENOUS
  Filled 2013-09-24: qty 1

## 2013-09-24 NOTE — ED Provider Notes (Signed)
CSN: [id number]     Arrival date & time 09/24/13  7824 History  This chart was scribed for Babette Relic, MD by Girtha Hake, ED Scribe. The patient was seen in [place]. The patient's care was started at 10:11 AM.     Chief Complaint  Patient presents with  . Abdominal Pain   Patient is a 53 y.o. male presenting with abdominal pain. The history is provided by the patient. No language interpreter was used.  Abdominal Pain  HPI Comments: Oscar Hall is a 53 y.o. male with a history of intermittent, chronic abdominal pain for the past two years for months at a time who presents to the Emergency Department complaining of severe abdominal pain beginning six days ago after running out of his narcotics a week ago for his chronic abdominal pain. Patient reports associated vomiting (yellow in color). He denies hematemesis. Patient states that he has experienced abdominal pain for the past two months with daily episodes of emesis, dry heaving, and nausea. Patient takes Oxycodone for management of chronic abdominal pain. He reports that he ran out of Oxycodone one week ago. At baseline the patient has severe abdominal pain for weeks at a time. He has no fever, CP, SOB.  PCP is Dr. Doreene Burke.   Past Medical History  Diagnosis Date  . PTSD (post-traumatic stress disorder)   . Coronary artery disease   . Hypercholesterolemia   . Fatty liver   . Hypercholesteremia   . Hypertension    Past Surgical History  Procedure Laterality Date  . Throat surgery      traumatic injury  . Hand surgery    . Knee surgery    . Shoulder surgery     Family History  Problem Relation Age of Onset  . Alcohol abuse Father   . Anxiety disorder Father   . Depression Father   . Coronary artery disease Father   . Alcohol abuse Brother   . Coronary artery disease Brother   . Schizophrenia Neg Hx   . Diabetes Mellitus II Neg Hx   . Drug abuse Brother   . Depression Brother 44    Commited suicide    History   Substance Use Topics  . Smoking status: Current Every Day Smoker -- 1.25 packs/day for 30 years  . Smokeless tobacco: Not on file  . Alcohol Use: No     Comment: Quit drinking. 2-3 fifths, binge drinking.    Review of Systems  Gastrointestinal: Positive for abdominal pain.   10 Systems reviewed and are negative for acute change except as noted in the HPI.    Allergies  Penicillins; Vistaril; Celebrex; Lithium; Neurontin; Soma; Sulfa antibiotics; Toradol; Tramadol; and Lamictal  Home Medications   Prior to Admission medications   Medication Sig Start Date End Date Taking? Authorizing Provider  albuterol (PROVENTIL HFA;VENTOLIN HFA) 108 (90 BASE) MCG/ACT inhaler Inhale 2 puffs into the lungs every 6 (six) hours as needed for wheezing or shortness of breath.   Yes Historical Provider, MD  amitriptyline (ELAVIL) 25 MG tablet Take 25-50 mg by mouth at bedtime.   Yes Historical Provider, MD  ezetimibe-simvastatin (VYTORIN) 10-10 MG per tablet Take 1 tablet by mouth at bedtime.   Yes Historical Provider, MD  lisinopril-hydrochlorothiazide (PRINZIDE,ZESTORETIC) 20-25 MG per tablet Take 0.5 tablets by mouth every morning.   Yes Historical Provider, MD  metoCLOPramide (REGLAN) 10 MG tablet Take 1 tablet (10 mg total) by mouth every 6 (six) hours as needed for nausea (nausea/headache). 09/24/13  Babette Relic, MD  ondansetron (ZOFRAN ODT) 8 MG disintegrating tablet 8mg  ODT q4 hours prn nausea 09/24/13   Babette Relic, MD  oxyCODONE (ROXICODONE) 5 MG immediate release tablet Take 1 tablet (5 mg total) by mouth every 6 (six) hours as needed for severe pain. 09/24/13   Babette Relic, MD   Triage Vitals: BP 123/85  Pulse 91  Temp(Src) 97.8 F (36.6 C) (Oral)  Resp 22  Ht 5\' 11"  (1.803 m)  Wt 255 lb (115.667 kg)  BMI 35.58 kg/m2  SpO2 98% Physical Exam  Nursing note and vitals reviewed. Constitutional:  Awake, alert, nontoxic appearance.  HENT:  Head: Atraumatic.  Eyes: Right eye exhibits no  discharge. Left eye exhibits no discharge.  Neck: Neck supple.  Cardiovascular: Normal rate and regular rhythm.   No murmur heard. Pulmonary/Chest: Effort normal and breath sounds normal. No respiratory distress. He has no wheezes. He has no rales. He exhibits no tenderness.  Abdominal: Soft. Bowel sounds are normal. There is no tenderness. There is no rebound.  Reducable nontender ventral hernia.   Musculoskeletal: He exhibits tenderness. He exhibits no edema.  Baseline ROM, no obvious new focal weakness.  Baseline tenderness to neck and back.   Neurological:  Mental status and motor strength appears baseline for patient and situation.  Skin: No rash noted.  Psychiatric: He has a normal mood and affect.    ED Course  Procedures (including critical care time) DIAGNOSTIC STUDIES: Oxygen Saturation is 98% on room air, normal by my interpretation.    COORDINATION OF CARE: 10:17 AM - Patient / Family / Caregiver understand and agree with initial ED impression and plan with expectations set for ED visit. Pt feels improved after observation and/or treatment in ED. Recheck abd SNT. 3888 Pt aware ED does not manage chronic pain and Pt needs to f/u with his PCP or pain doctor. Patient informed of clinical course, understand medical decision-making process, and agree with plan. Labs Review Labs Reviewed  CBC WITH DIFFERENTIAL - Abnormal; Notable for the following:    Eosinophils Relative 7 (*)    Eosinophils Absolute 0.8 (*)    All other components within normal limits  COMPREHENSIVE METABOLIC PANEL - Abnormal; Notable for the following:    Glucose, Bld 100 (*)    All other components within normal limits  URINALYSIS, ROUTINE W REFLEX MICROSCOPIC - Abnormal; Notable for the following:    Specific Gravity, Urine <1.005 (*)    All other components within normal limits  LIPASE, BLOOD  I-STAT TROPOININ, ED    Imaging Review No results found.   EKG Interpretation None     Muse not  working: ECG: Sinus rhythm, ventricular rate 93, normal axis, normal intervals, no acute ischemic changes noted, impression normal ECG, no comparison ECG immediately available MDM   Final diagnoses:  Chronic abdominal pain    I doubt any other EMC precluding discharge at this time including, but not necessarily limited to the following:ongoing severe withdrawal, SBI, peritonitis.  This chart was scribed in my presence and reviewed by me personally.   Babette Relic, MD 09/25/13 1311

## 2013-09-24 NOTE — ED Notes (Signed)
Patients feet cold provided socks

## 2013-09-24 NOTE — ED Notes (Signed)
Pt c/o bilateral rib pain/ epigastric pain with n/v that started during the night, pt reports that he has numerous "spells" like this, has been seen in er several times with no diagnosis,

## 2013-09-24 NOTE — Discharge Instructions (Signed)
Abdominal (belly) pain can be caused by many things. Your caregiver performed an examination and possibly ordered blood/urine tests and imaging (CT scan, x-rays, ultrasound). Many cases can be observed and treated at home after initial evaluation in the emergency department. Even though you are being discharged home, abdominal pain can be unpredictable. Therefore, you need a repeated exam if your pain does not resolve, returns, or worsens. Most patients with abdominal pain don't have to be admitted to the hospital or have surgery, but serious problems like appendicitis and gallbladder attacks can start out as nonspecific pain. Many abdominal conditions cannot be diagnosed in one visit, so follow-up evaluations are very important. °SEEK IMMEDIATE MEDICAL ATTENTION IF: °The pain does not go away or becomes severe.  °A temperature above 101 develops.  °Repeated vomiting occurs (multiple episodes).  °The pain becomes localized to portions of the abdomen. The right side could possibly be appendicitis. In an adult, the left lower portion of the abdomen could be colitis or diverticulitis.  °Blood is being passed in stools or vomit (bright red or black tarry stools).  °Return also if you develop chest pain, difficulty breathing, dizziness or fainting, or become confused, poorly responsive, or inconsolable (young children). ° °Chronic Pain Discharge Instructions  °Emergency care providers appreciate that many patients coming to us are in severe pain and we wish to address their pain in the safest, most responsible manner.  It is important to recognize however, that the proper treatment of chronic pain differs from that of the pain of injuries and acute illnesses.  Our goal is to provide quality, safe, personalized care and we thank you for giving us the opportunity to serve you. °The use of narcotics and related agents for chronic pain syndromes may lead to additional physical and psychological problems.  Nearly as many  people die from prescription narcotics each year as die from car crashes.  Additionally, this risk is increased if such prescriptions are obtained from a variety of sources.  Therefore, only your primary care physician or a pain management specialist is able to safely treat such syndromes with narcotic medications long-term.   ° °Documentation revealing such prescriptions have been sought from multiple sources may prohibit us from providing a refill or different narcotic medication.  Your name may be checked first through the [place] Controlled Substances Reporting System.  This database is a record of controlled substance medication prescriptions that the patient has received.  This has been established by [place] in an effort to eliminate the dangerous, and often life threatening, practice of obtaining multiple prescriptions from different medical providers.  ° °If you have a chronic pain syndrome (i.e. chronic headaches, recurrent back or neck pain, dental pain, abdominal or pelvis pain without a specific diagnosis, or neuropathic pain such as fibromyalgia) or recurrent visits for the same condition without an acute diagnosis, you may be treated with non-narcotics and other non-addictive medicines.  Allergic reactions or negative side effects that may be reported by a patient to such medications will not typically lead to the use of a narcotic analgesic or other controlled substance as an alternative. °  °Patients managing chronic pain with a personal physician should have provisions in place for breakthrough pain.  If you are in crisis, you should call your physician.  If your physician directs you to the emergency department, please have the doctor call and speak to our attending physician concerning your care. °  °When patients come to the Emergency Department (ED) with acute   medical conditions in which the Emergency Department physician feels appropriate to prescribe narcotic or sedating pain  medication, the physician will prescribe these in very limited quantities.  The amount of these medications will last only until you can see your primary care physician in his/her office.  Any patient who returns to the ED seeking refills should expect only non-narcotic pain medications.  ° °In the event of an acute medical condition exists and the emergency physician feels it is necessary that the patient be given a narcotic or sedating medication -  a responsible adult driver should be present in the room prior to the medication being given by the nurse. °  °Prescriptions for narcotic or sedating medications that have been lost, stolen or expired will not be refilled in the Emergency Department.   ° °Patients who have chronic pain may receive non-narcotic prescriptions until seen by their primary care physician.  It is every patient’s personal responsibility to maintain active prescriptions with his or her primary care physician or specialist. °

## 2013-10-05 ENCOUNTER — Ambulatory Visit: Payer: Self-pay | Attending: Internal Medicine | Admitting: Internal Medicine

## 2013-10-05 ENCOUNTER — Encounter: Payer: Self-pay | Admitting: Internal Medicine

## 2013-10-05 VITALS — BP 126/84 | HR 87 | Temp 98.2°F | Resp 16 | Ht 71.0 in | Wt 261.0 lb

## 2013-10-05 DIAGNOSIS — Z888 Allergy status to other drugs, medicaments and biological substances status: Secondary | ICD-10-CM | POA: Insufficient documentation

## 2013-10-05 DIAGNOSIS — M25419 Effusion, unspecified shoulder: Secondary | ICD-10-CM

## 2013-10-05 DIAGNOSIS — I2510 Atherosclerotic heart disease of native coronary artery without angina pectoris: Secondary | ICD-10-CM | POA: Insufficient documentation

## 2013-10-05 DIAGNOSIS — Z880 Allergy status to penicillin: Secondary | ICD-10-CM | POA: Insufficient documentation

## 2013-10-05 DIAGNOSIS — I10 Essential (primary) hypertension: Secondary | ICD-10-CM | POA: Insufficient documentation

## 2013-10-05 DIAGNOSIS — E7800 Pure hypercholesterolemia, unspecified: Secondary | ICD-10-CM | POA: Insufficient documentation

## 2013-10-05 DIAGNOSIS — M25512 Pain in left shoulder: Secondary | ICD-10-CM

## 2013-10-05 DIAGNOSIS — F17200 Nicotine dependence, unspecified, uncomplicated: Secondary | ICD-10-CM | POA: Insufficient documentation

## 2013-10-05 DIAGNOSIS — Z79899 Other long term (current) drug therapy: Secondary | ICD-10-CM | POA: Insufficient documentation

## 2013-10-05 DIAGNOSIS — M25412 Effusion, left shoulder: Secondary | ICD-10-CM

## 2013-10-05 DIAGNOSIS — M25519 Pain in unspecified shoulder: Secondary | ICD-10-CM

## 2013-10-05 DIAGNOSIS — K7689 Other specified diseases of liver: Secondary | ICD-10-CM | POA: Insufficient documentation

## 2013-10-05 DIAGNOSIS — C750 Malignant neoplasm of parathyroid gland: Secondary | ICD-10-CM | POA: Insufficient documentation

## 2013-10-05 DIAGNOSIS — F4310 Post-traumatic stress disorder, unspecified: Secondary | ICD-10-CM | POA: Insufficient documentation

## 2013-10-05 DIAGNOSIS — Z882 Allergy status to sulfonamides status: Secondary | ICD-10-CM | POA: Insufficient documentation

## 2013-10-05 MED ORDER — OXYCODONE HCL 5 MG PO TABS: 5.0000 mg | ORAL_TABLET | Freq: Four times a day (QID) | ORAL | Status: DC | PRN

## 2013-10-05 NOTE — Progress Notes (Signed)
Patient ID: Oscar Hall, male   DOB: 08-26-1960, 53 y.o.   MRN: [id number]   CC: Left shoulder pain  HPI: Patient is 52 year old male who comes to clinic for follow up, had recent surgery and left shoulder. He still describes pain as throbbing and constant, 10 out of 10 in severity, unable to move shoulder above 90 angle, difficulty with extension and flexion, difficulty with rotating left shoulder. He would like a referral to pain clinic. He denies fevers and chills, no numbness or tingling.  Allergies  Allergen Reactions  . Penicillins Anaphylaxis and Swelling  . Vistaril [Hydroxyzine Hcl] Hives and Shortness Of Breath  . Celebrex [Celecoxib] Other (See Comments)    G.I. Upset  . Lithium Other (See Comments)    Increases blood pressure.  . Neurontin [Gabapentin] Nausea And Vomiting  . Soma [Carisoprodol] Other (See Comments)    G.I. Upset   . Sulfa Antibiotics Hives and Itching  . Toradol [Ketorolac Tromethamine] Other (See Comments)    G.I. Upset   . Tramadol Other (See Comments)    G.I. Upset   . Lamictal [Lamotrigine] Hives and Rash    Increased blood pressure   Past Medical History  Diagnosis Date  . PTSD (post-traumatic stress disorder)   . Coronary artery disease   . Hypercholesterolemia   . Fatty liver   . Hypercholesteremia   . Hypertension    Current Outpatient Prescriptions on File Prior to Visit  Medication Sig Dispense Refill  . amitriptyline (ELAVIL) 25 MG tablet Take 25-50 mg by mouth at bedtime.      Marland Kitchen ezetimibe-simvastatin (VYTORIN) 10-10 MG per tablet Take 1 tablet by mouth at bedtime.      Marland Kitchen lisinopril-hydrochlorothiazide (PRINZIDE,ZESTORETIC) 20-25 MG per tablet Take 0.5 tablets by mouth every morning.      . metoCLOPramide (REGLAN) 10 MG tablet Take 1 tablet (10 mg total) by mouth every 6 (six) hours as needed for nausea (nausea/headache).  6 tablet  0  . ondansetron (ZOFRAN ODT) 8 MG disintegrating tablet 8mg  ODT q4 hours prn nausea  4 tablet  0  .  albuterol (PROVENTIL HFA;VENTOLIN HFA) 108 (90 BASE) MCG/ACT inhaler Inhale 2 puffs into the lungs every 6 (six) hours as needed for wheezing or shortness of breath.       No current facility-administered medications on file prior to visit.   Family History  Problem Relation Age of Onset  . Alcohol abuse Father   . Anxiety disorder Father   . Depression Father   . Coronary artery disease Father   . Alcohol abuse Brother   . Coronary artery disease Brother   . Schizophrenia Neg Hx   . Diabetes Mellitus II Neg Hx   . Drug abuse Brother   . Depression Brother 69    Commited suicide    History   Social History  . Marital Status: Married    Spouse Name: N/A    Number of Children: N/A  . Years of Education: N/A   Occupational History  . Not on file.   Social History Main Topics  . Smoking status: Current Every Day Smoker -- 1.25 packs/day for 30 years  . Smokeless tobacco: Not on file  . Alcohol Use: No     Comment: Quit drinking. 2-3 fifths, binge drinking.  . Drug Use: Yes    Special: Marijuana     Comment: last used couple months per pt 09/14/13  . Sexual Activity: Not Currently    Partners: Female   Other Topics  Concern  . Not on file   Social History Narrative  . No narrative on file    Review of Systems  Constitutional: Negative for fever, chills, diaphoresis, activity change, appetite change and fatigue.  HENT: Negative for ear pain, nosebleeds, congestion, facial swelling, rhinorrhea, neck pain, neck stiffness and ear discharge.   Eyes: Negative for pain, discharge, redness, itching and visual disturbance.  Respiratory: Negative for cough, choking, chest tightness, shortness of breath, wheezing and stridor.   Cardiovascular: Negative for chest pain, palpitations and leg swelling.  Gastrointestinal: Negative for abdominal distention.  Genitourinary: Negative for dysuria, urgency, frequency, hematuria, flank pain, decreased urine volume, difficulty urinating and  dyspareunia.  Musculoskeletal: Negative for back pain, joint swelling, arthralgias and gait problem.  Neurological: Negative for dizziness, tremors, seizures, syncope, facial asymmetry, speech difficulty, weakness, light-headedness, numbness and headaches.  Hematological: Negative for adenopathy. Does not bruise/bleed easily.  Psychiatric/Behavioral: Negative for hallucinations, behavioral problems, confusion, dysphoric mood, decreased concentration and agitation.    Objective:   Filed Vitals:   10/05/13 1033  BP: 126/84  Pulse: 87  Temp: 98.2 F (36.8 C)  Resp: 16    Physical Exam  Constitutional: Appears well-developed and well-nourished. No distress.  Neck: Normal ROM. Neck supple. No JVD. No tracheal deviation. No thyromegaly.  CVS: RRR, S1/S2 +, no murmurs, no gallops, no carotid bruit.  Pulmonary: Effort and breath sounds normal, no stridor, rhonchi, wheezes, rales.  Abdominal: Soft. BS +,  no distension, tenderness, rebound or guarding.  Musculoskeletal: Significant tenderness at left shoulder area, Asian unable to lift shoulder above 90, unable to rotate the shoulder due to pain  Lymphadenopathy: No lymphadenopathy noted, cervical, inguinal.   Lab Results  Component Value Date   WBC 10.4 09/24/2013   HGB 15.0 09/24/2013   HCT 41.8 09/24/2013   MCV 94.4 09/24/2013   PLT 213 09/24/2013   Lab Results  Component Value Date   CREATININE 0.90 09/24/2013   BUN 9 09/24/2013   NA 138 09/24/2013   K 3.7 09/24/2013   CL 101 09/24/2013   CO2 26 09/24/2013    Lab Results  Component Value Date   HGBA1C 5.7* 02/13/2013   Lipid Panel     Component Value Date/Time   CHOL 169 02/13/2013 1315   TRIG 189* 02/13/2013 1315   HDL 36* 02/13/2013 1315   CHOLHDL 4.7 02/13/2013 1315   VLDL 38 02/13/2013 1315   [place] 95 02/13/2013 1315       Assessment and plan:   Patient Active Problem List   Diagnosis Date Noted  . Bipolar I disorder, most recent episode manic, severe without psychotic  features 02/13/2013  - stable   Hypertension - Continue current medical regimen - Patient advised to check blood pressure regularly and to call us back if the numbers are persistently higher than 140/90 today we can readjust her regimen   Left shoulder pain - With significant tenderness on today's examination - I will provide one time analgesia for patient due to his significant pain, I will also provide referral to pain clinic  - patient made aware that no narcotics can be prescribed in this clinic

## 2013-10-05 NOTE — Progress Notes (Signed)
Pt is here following up on his chronic shoulder and neck pain. Pt had shoulder sx on 09/21/13.

## 2013-10-12 ENCOUNTER — Emergency Department (HOSPITAL_COMMUNITY)
Admission: EM | Admit: 2013-10-12 | Discharge: 2013-10-12 | Disposition: A | Discharge status: Home / Self Care | Payer: Self-pay | Attending: Emergency Medicine | Admitting: Emergency Medicine

## 2013-10-12 ENCOUNTER — Encounter (HOSPITAL_COMMUNITY): Payer: Self-pay | Admitting: Emergency Medicine

## 2013-10-12 DIAGNOSIS — Z79899 Other long term (current) drug therapy: Secondary | ICD-10-CM | POA: Insufficient documentation

## 2013-10-12 DIAGNOSIS — R109 Unspecified abdominal pain: Secondary | ICD-10-CM | POA: Insufficient documentation

## 2013-10-12 DIAGNOSIS — F4310 Post-traumatic stress disorder, unspecified: Secondary | ICD-10-CM | POA: Insufficient documentation

## 2013-10-12 DIAGNOSIS — R1110 Vomiting, unspecified: Secondary | ICD-10-CM | POA: Insufficient documentation

## 2013-10-12 DIAGNOSIS — F17200 Nicotine dependence, unspecified, uncomplicated: Secondary | ICD-10-CM | POA: Insufficient documentation

## 2013-10-12 DIAGNOSIS — Z9889 Other specified postprocedural states: Secondary | ICD-10-CM | POA: Insufficient documentation

## 2013-10-12 DIAGNOSIS — I10 Essential (primary) hypertension: Secondary | ICD-10-CM | POA: Insufficient documentation

## 2013-10-12 DIAGNOSIS — I2510 Atherosclerotic heart disease of native coronary artery without angina pectoris: Secondary | ICD-10-CM | POA: Insufficient documentation

## 2013-10-12 DIAGNOSIS — E7800 Pure hypercholesterolemia, unspecified: Secondary | ICD-10-CM | POA: Insufficient documentation

## 2013-10-12 DIAGNOSIS — R197 Diarrhea, unspecified: Secondary | ICD-10-CM | POA: Insufficient documentation

## 2013-10-12 DIAGNOSIS — Z880 Allergy status to penicillin: Secondary | ICD-10-CM | POA: Insufficient documentation

## 2013-10-12 LAB — BASIC METABOLIC PANEL
Anion gap: 16 — ABNORMAL HIGH (ref 5–15)
BUN: 10 mg/dL (ref 6–23)
CO2: 24 mEq/L (ref 19–32)
Calcium: 10.2 mg/dL (ref 8.4–10.5)
Chloride: 98 mEq/L (ref 96–112)
Creatinine, Ser: 0.8 mg/dL (ref 0.50–1.35)
GFR calc Af Amer: >90 mL/min (ref >90)
GFR calc non Af Amer: >90 mL/min (ref >90)
Glucose, Bld: 116 mg/dL — ABNORMAL HIGH (ref 70–99)
Potassium: 4.1 mEq/L (ref 3.7–5.3)
Sodium: 138 mEq/L (ref 137–147)

## 2013-10-12 LAB — CBC WITH DIFFERENTIAL/PLATELET
Basophils Absolute: 0 10*3/uL (ref 0.0–0.1)
Basophils Relative: 0 % (ref 0–1)
Eosinophils Absolute: 0.3 10*3/uL (ref 0.0–0.7)
Eosinophils Relative: 2 % (ref 0–5)
HCT: 48.9 % (ref 39.0–52.0)
Hemoglobin: 17.8 g/dL — ABNORMAL HIGH (ref 13.0–17.0)
Lymphocytes Relative: 16 % (ref 12–46)
Lymphs Abs: 2.1 10*3/uL (ref 0.7–4.0)
MCH: 34.5 pg — ABNORMAL HIGH (ref 26.0–34.0)
MCHC: 36.4 g/dL — ABNORMAL HIGH (ref 30.0–36.0)
MCV: 94.8 fL (ref 78.0–100.0)
Monocytes Absolute: 0.6 10*3/uL (ref 0.1–1.0)
Monocytes Relative: 4 % (ref 3–12)
Neutro Abs: 10.4 10*3/uL — ABNORMAL HIGH (ref 1.7–7.7)
Neutrophils Relative %: 78 % — ABNORMAL HIGH (ref 43–77)
Platelets: 242 10*3/uL (ref 150–400)
RBC: 5.16 MIL/uL (ref 4.22–5.81)
RDW: 12.8 % (ref 11.5–15.5)
WBC: 13.4 10*3/uL — ABNORMAL HIGH (ref 4.0–10.5)

## 2013-10-12 LAB — URINALYSIS, ROUTINE W REFLEX MICROSCOPIC
Bilirubin Urine: NEGATIVE
Glucose, UA: NEGATIVE mg/dL
Ketones, ur: NEGATIVE mg/dL
Leukocytes, UA: NEGATIVE
Nitrite: NEGATIVE
Specific Gravity, Urine: 1.02 (ref 1.005–1.030)
Urobilinogen, UA: 0.2 mg/dL (ref 0.0–1.0)
pH: 6.5 (ref 5.0–8.0)

## 2013-10-12 LAB — URINE MICROSCOPIC-ADD ON

## 2013-10-12 LAB — LIPASE, BLOOD: Lipase: 23 U/L (ref 11–59)

## 2013-10-12 MED ORDER — ONDANSETRON HCL 4 MG/2ML IJ SOLN
4.0000 mg | Freq: Once | INTRAMUSCULAR | Status: AC
  Administered 2013-10-12: 4 mg via INTRAVENOUS

## 2013-10-12 MED ORDER — HYDROMORPHONE HCL PF 1 MG/ML IJ SOLN
1.0000 mg | Freq: Once | INTRAMUSCULAR | Status: AC
  Administered 2013-10-12: 1 mg via INTRAVENOUS
  Filled 2013-10-12: qty 1

## 2013-10-12 MED ORDER — ONDANSETRON HCL 4 MG/2ML IJ SOLN
4.0000 mg | Freq: Once | INTRAMUSCULAR | Status: DC
  Filled 2013-10-12: qty 2

## 2013-10-12 MED ORDER — ONDANSETRON HCL 4 MG PO TABS: 4.0000 mg | ORAL_TABLET | Freq: Four times a day (QID) | ORAL | Status: DC

## 2013-10-12 MED ORDER — SODIUM CHLORIDE 0.9 % IV BOLUS (SEPSIS)
1000.0000 mL | Freq: Once | INTRAVENOUS | Status: AC
  Administered 2013-10-12: 1000 mL via INTRAVENOUS

## 2013-10-12 NOTE — Discharge Instructions (Signed)

## 2013-10-12 NOTE — ED Notes (Signed)
Pt reports emesis,diarrhea and abdominal pain starting this am.

## 2013-10-12 NOTE — ED Provider Notes (Signed)
CSN: [id number]     Arrival date & time 10/12/13  1522 History  This chart was scribed for Virgel Manifold, MD by Starleen Arms, ED Scribe. This patient was seen in room APA10/APA10 and the patient's care was started at 3:30 PM.    Chief Complaint  Patient presents with  . Emesis    The history is provided by the patient. No language interpreter was used.    HPI Comments: Oscar Hall is a 53 y.o. male who presents to the Emergency Department complaining of intermittent, sharp abdominal pain onset 3 am this morning.  He also states he has associated diarrhea and vomiting/dry-heaving that onset concurrently.  He states he has experienced similar episodes of pain in the recent past but denies vomiting and diarrhea during these episodes.  Patient states that nothing aggravates the pain but it is alleviated by assuming the fetal position.  Patient has taken Zofran this morning but vomited the medication back up.  Patient denies taking anything for pain.  Patient denies fever, urinary frequency, hematuria.  Patient denies abdominal surgery.  Patient does not consume alcohol or smoke.    Past Medical History  Diagnosis Date  . PTSD (post-traumatic stress disorder)   . Coronary artery disease   . Hypercholesterolemia   . Fatty liver   . Hypercholesteremia   . Hypertension    Past Surgical History  Procedure Laterality Date  . Throat surgery      traumatic injury  . Hand surgery    . Knee surgery    . Shoulder surgery     Family History  Problem Relation Age of Onset  . Alcohol abuse Father   . Anxiety disorder Father   . Depression Father   . Coronary artery disease Father   . Alcohol abuse Brother   . Coronary artery disease Brother   . Schizophrenia Neg Hx   . Diabetes Mellitus II Neg Hx   . Drug abuse Brother   . Depression Brother 26    Commited suicide    History  Substance Use Topics  . Smoking status: Current Every Day Smoker -- 1.25 packs/day for 30 years  . Smokeless  tobacco: Not on file  . Alcohol Use: No     Comment: Quit drinking. 2-3 fifths, binge drinking.    Review of Systems  Constitutional: Negative for fever.  Gastrointestinal: Positive for nausea, vomiting, abdominal pain and diarrhea.  Genitourinary: Negative for frequency and hematuria.  All other systems reviewed and are negative.     Allergies  Penicillins; Vistaril; Celebrex; Lithium; Neurontin; Soma; Sulfa antibiotics; Toradol; Tramadol; and Lamictal  Home Medications   Prior to Admission medications   Medication Sig Start Date End Date Taking? Authorizing Provider  albuterol (PROVENTIL HFA;VENTOLIN HFA) 108 (90 BASE) MCG/ACT inhaler Inhale 2 puffs into the lungs every 6 (six) hours as needed for wheezing or shortness of breath.    Historical Provider, MD  amitriptyline (ELAVIL) 25 MG tablet Take 25-50 mg by mouth at bedtime.    Historical Provider, MD  ezetimibe-simvastatin (VYTORIN) 10-10 MG per tablet Take 1 tablet by mouth at bedtime.    Historical Provider, MD  lisinopril-hydrochlorothiazide (PRINZIDE,ZESTORETIC) 20-25 MG per tablet Take 0.5 tablets by mouth every morning.    Historical Provider, MD  metoCLOPramide (REGLAN) 10 MG tablet Take 1 tablet (10 mg total) by mouth every 6 (six) hours as needed for nausea (nausea/headache). 09/24/13   Babette Relic, MD  ondansetron (ZOFRAN ODT) 8 MG disintegrating tablet 8mg  ODT q4  hours prn nausea 09/24/13   Babette Relic, MD  oxyCODONE (ROXICODONE) 5 MG immediate release tablet Take 1 tablet (5 mg total) by mouth every 6 (six) hours as needed for severe pain. 10/05/13   Theodis Blaze, MD   Ht 5\' 11"  (1.803 m)  Wt 260 lb (117.935 kg)  BMI 36.28 kg/m2 Physical Exam  Nursing note and vitals reviewed. Constitutional: He is oriented to person, place, and time. He appears well-developed and well-nourished.  Appears uncomfortable  HENT:  Head: Normocephalic and atraumatic.  Eyes: Conjunctivae and EOM are normal.  Neck: Neck supple. No  tracheal deviation present.  Cardiovascular: Normal rate and regular rhythm.   Pulmonary/Chest: Effort normal and breath sounds normal. No respiratory distress. He has no wheezes. He has no rales.  Abdominal: There is tenderness (Multiple areas but not consistent with repalpation in the same areas. ). There is no rebound and no guarding.  Musculoskeletal: Normal range of motion.  Neurological: He is alert and oriented to person, place, and time.  Skin: Skin is warm and dry.  Psychiatric: He has a normal mood and affect. His behavior is normal.    ED Course  Procedures (including critical care time)  DIAGNOSTIC STUDIES: Oxygen Saturation is 100% on RA, normal by my interpretation.    COORDINATION OF CARE:  3:49 PM Discussed plans to order pain medication and anti-emetics.  Patient acknowledges and agrees with plan.    Labs Review Labs Reviewed  CBC WITH DIFFERENTIAL - Abnormal; Notable for the following:    WBC 13.4 (*)    Hemoglobin 17.8 (*)    MCH 34.5 (*)    MCHC 36.4 (*)    Neutrophils Relative % 78 (*)    Neutro Abs 10.4 (*)    All other components within normal limits  BASIC METABOLIC PANEL - Abnormal; Notable for the following:    Glucose, Bld 116 (*)    Anion gap 16 (*)    All other components within normal limits  URINALYSIS, ROUTINE W REFLEX MICROSCOPIC - Abnormal; Notable for the following:    Hgb urine dipstick TRACE (*)    Protein, ur TRACE (*)    All other components within normal limits  LIPASE, BLOOD  URINE MICROSCOPIC-ADD ON    Imaging Review No results found.   EKG Interpretation None      MDM   Final diagnoses:  Abdominal pain, unspecified abdominal location  Diarrhea    53 year old male with abdominal pain. Seems more chronic in nature. Tenderness on exam, but no peritoneal signs. Feels significantly better after pain medication and anti-emetic in the emergency room. Requesting prescription for pain medicine and Zofran. Discussed with  patient that given the chronicity of his complaints that he did not feel comfortable prescribing any narcotics. He was provided with a prescription for Zofran. Return precautions were discussed. He needs to followup with either his PCP or with pain management. Does not appear to be an acute surgical process or other emergent pathology I feel he is stable for discharge.  I personally preformed the services scribed in my presence. The recorded information has been reviewed is accurate. Virgel Manifold, MD.    Virgel Manifold, MD 10/18/13 1330

## 2013-10-12 NOTE — ED Notes (Signed)
Pt alert & oriented x4, stable gait. Patient given discharge instructions, paperwork & prescription(s). Patient  instructed to stop at the registration desk to finish any additional paperwork. Patient verbalized understanding. Pt left department w/ no further questions. 

## 2013-10-15 ENCOUNTER — Emergency Department (HOSPITAL_COMMUNITY)
Admission: EM | Admit: 2013-10-15 | Discharge: 2013-10-15 | Disposition: A | Discharge status: Home / Self Care | Payer: Self-pay | Attending: Emergency Medicine | Admitting: Emergency Medicine

## 2013-10-15 ENCOUNTER — Encounter (HOSPITAL_COMMUNITY): Payer: Self-pay | Admitting: Emergency Medicine

## 2013-10-15 DIAGNOSIS — F17200 Nicotine dependence, unspecified, uncomplicated: Secondary | ICD-10-CM | POA: Insufficient documentation

## 2013-10-15 DIAGNOSIS — Z880 Allergy status to penicillin: Secondary | ICD-10-CM | POA: Insufficient documentation

## 2013-10-15 DIAGNOSIS — Z8719 Personal history of other diseases of the digestive system: Secondary | ICD-10-CM | POA: Insufficient documentation

## 2013-10-15 DIAGNOSIS — R112 Nausea with vomiting, unspecified: Secondary | ICD-10-CM | POA: Insufficient documentation

## 2013-10-15 DIAGNOSIS — E7800 Pure hypercholesterolemia, unspecified: Secondary | ICD-10-CM | POA: Insufficient documentation

## 2013-10-15 DIAGNOSIS — Z79899 Other long term (current) drug therapy: Secondary | ICD-10-CM | POA: Insufficient documentation

## 2013-10-15 DIAGNOSIS — R109 Unspecified abdominal pain: Secondary | ICD-10-CM | POA: Insufficient documentation

## 2013-10-15 DIAGNOSIS — F4310 Post-traumatic stress disorder, unspecified: Secondary | ICD-10-CM | POA: Insufficient documentation

## 2013-10-15 DIAGNOSIS — I2510 Atherosclerotic heart disease of native coronary artery without angina pectoris: Secondary | ICD-10-CM | POA: Insufficient documentation

## 2013-10-15 DIAGNOSIS — R197 Diarrhea, unspecified: Secondary | ICD-10-CM | POA: Insufficient documentation

## 2013-10-15 DIAGNOSIS — I10 Essential (primary) hypertension: Secondary | ICD-10-CM | POA: Insufficient documentation

## 2013-10-15 LAB — I-STAT CHEM 8, ED
BUN: 10 mg/dL (ref 6–23)
CREATININE: 0.8 mg/dL (ref 0.50–1.35)
Calcium, Ion: 1.18 mmol/L (ref 1.12–1.23)
Chloride: 103 mEq/L (ref 96–112)
GLUCOSE: 112 mg/dL — AB (ref 70–99)
HEMATOCRIT: 51 % (ref 39.0–52.0)
HEMOGLOBIN: 17.3 g/dL — AB (ref 13.0–17.0)
Potassium: 3.8 mEq/L (ref 3.7–5.3)
Sodium: 138 mEq/L (ref 137–147)
TCO2: 21 mmol/L (ref 0–100)

## 2013-10-15 LAB — I-STAT CG4 LACTIC ACID, ED
Lactic Acid, Venous: 1.73 mmol/L (ref 0.5–2.2)
Lactic Acid, Venous: 2.48 mmol/L — ABNORMAL HIGH (ref 0.5–2.2)

## 2013-10-15 MED ORDER — SODIUM CHLORIDE 0.9 % IV BOLUS (SEPSIS)
1000.0000 mL | Freq: Once | INTRAVENOUS | Status: AC
  Administered 2013-10-15: 1000 mL via INTRAVENOUS

## 2013-10-15 MED ORDER — ONDANSETRON HCL 4 MG/2ML IJ SOLN
4.0000 mg | Freq: Once | INTRAMUSCULAR | Status: AC
  Administered 2013-10-15: 4 mg via INTRAVENOUS
  Filled 2013-10-15: qty 2

## 2013-10-15 MED ORDER — METOCLOPRAMIDE HCL 5 MG/ML IJ SOLN
10.0000 mg | Freq: Once | INTRAMUSCULAR | Status: AC
  Administered 2013-10-15: 10 mg via INTRAVENOUS
  Filled 2013-10-15: qty 2

## 2013-10-15 MED ORDER — OXYCODONE-ACETAMINOPHEN 5-325 MG PO TABS
1.0000 | ORAL_TABLET | Freq: Once | ORAL | Status: AC
  Administered 2013-10-15: 1 via ORAL
  Filled 2013-10-15: qty 1

## 2013-10-15 MED ORDER — ONDANSETRON HCL 4 MG/2ML IJ SOLN: 4.0000 mg | Freq: Once | INTRAMUSCULAR | Status: DC

## 2013-10-15 NOTE — ED Notes (Addendum)
Pt not in room at this time. Has left without papers.

## 2013-10-15 NOTE — Discharge Instructions (Signed)

## 2013-10-15 NOTE — ED Provider Notes (Signed)
CSN: [id number]     Arrival date & time 10/15/13  1157 History  This chart was scribed for Oscar Cable, MD by Girtha Hake, ED Scribe. The patient was seen in APA14/APA14. The patient's care was started at 12:19 PM.   Chief Complaint  Patient presents with  . Emesis  . Abdominal Pain    Patient is a 53 y.o. male presenting with vomiting and abdominal pain. The history is provided by the patient and the spouse. No language interpreter was used.  Emesis Severity:  Moderate Duration:  9 hours Timing:  Intermittent Quality:  Unable to specify Progression:  Resolved Associated symptoms: abdominal pain and diarrhea   Abdominal pain:    Severity:  Severe   Timing:  Constant   Progression:  Unchanged   Chronicity:  Chronic Abdominal Pain Associated symptoms: diarrhea and vomiting   Associated symptoms: no dysuria    HPI Comments: Oscar Hall is a 53 y.o. male who presents to the Emergency Department complaining of abdominal pain beginning approximately 9 hours ago when he awoke at 3:00 AM. He reports associated diarrhea and vomiting. Vomiting has since resolved. Patient denies dysuria or hematochezia.  PCP is Dr. Doreene Burke.  Past Medical History  Diagnosis Date  . PTSD (post-traumatic stress disorder)   . Coronary artery disease   . Hypercholesterolemia   . Fatty liver   . Hypercholesteremia   . Hypertension    Past Surgical History  Procedure Laterality Date  . Throat surgery      traumatic injury  . Hand surgery    . Knee surgery    . Shoulder surgery     Family History  Problem Relation Age of Onset  . Alcohol abuse Father   . Anxiety disorder Father   . Depression Father   . Coronary artery disease Father   . Alcohol abuse Brother   . Coronary artery disease Brother   . Schizophrenia Neg Hx   . Diabetes Mellitus II Neg Hx   . Drug abuse Brother   . Depression Brother 26    Commited suicide    History  Substance Use Topics  . Smoking status: Current  Every Day Smoker -- 1.25 packs/day for 30 years  . Smokeless tobacco: Not on file  . Alcohol Use: No     Comment: Quit drinking. 2-3 fifths, binge drinking.    Review of Systems  Gastrointestinal: Positive for vomiting, abdominal pain and diarrhea. Negative for blood in stool.  Genitourinary: Negative for dysuria.  All other systems reviewed and are negative.     Allergies  Penicillins; Vistaril; Celebrex; Lithium; Neurontin; Soma; Sulfa antibiotics; Toradol; Tramadol; and Lamictal  Home Medications   Prior to Admission medications   Medication Sig Start Date End Date Taking? Authorizing Provider  albuterol (PROVENTIL HFA;VENTOLIN HFA) 108 (90 BASE) MCG/ACT inhaler Inhale 2 puffs into the lungs every 6 (six) hours as needed for wheezing or shortness of breath.    Historical Provider, MD  amitriptyline (ELAVIL) 25 MG tablet Take 25-50 mg by mouth at bedtime.    Historical Provider, MD  ezetimibe-simvastatin (VYTORIN) 10-10 MG per tablet Take 1 tablet by mouth at bedtime.    Historical Provider, MD  lisinopril-hydrochlorothiazide (PRINZIDE,ZESTORETIC) 20-25 MG per tablet Take 0.5 tablets by mouth every morning.    Historical Provider, MD  metoCLOPramide (REGLAN) 10 MG tablet Take 1 tablet (10 mg total) by mouth every 6 (six) hours as needed for nausea (nausea/headache). 09/24/13   Babette Relic, MD  ondansetron (  ZOFRAN) 4 MG tablet Take 1 tablet (4 mg total) by mouth every 6 (six) hours. 10/12/13   Virgel Manifold, MD  ondansetron (ZOFRAN-ODT) 8 MG disintegrating tablet Take 8 mg by mouth every 4 (four) hours as needed for nausea or vomiting.    Historical Provider, MD  oxyCODONE (ROXICODONE) 5 MG immediate release tablet Take 1 tablet (5 mg total) by mouth every 6 (six) hours as needed for severe pain. 10/05/13   Theodis Blaze, MD   BP 159/109  Pulse 101  Temp(Src) 97.4 F (36.3 C) (Oral)  Resp 22  SpO2 98% Physical Exam CONSTITUTIONAL: Well developed/well nourished, patient writhing  around on bed HEAD: Normocephalic/atraumatic EYES: EOMI/PERRL ENMT: Mucous membranes moist NECK: supple no meningeal signs SPINE:entire spine nontender CV: S1/S2 noted, no murmurs/rubs/gallops noted LUNGS: Lungs are clear to auscultation bilaterally, no apparent distress ABDOMEN: soft, nontender, no rebound or guarding, no focal tenderness, easily reducible umbilical hernia GU:no cva tenderness NEURO: Pt is awake/alert, moves all extremitiesx4 EXTREMITIES: pulses normal, full ROM SKIN: warm, color normal PSYCH: anxious   ED Course  Procedures  DIAGNOSTIC STUDIES: Oxygen Saturation is 98% on room air, normal by my interpretation.    COORDINATION OF CARE: 12:20 PM-Discussed treatment plan which includes Zofran, [place], IV fluids, EKG, and labs with pt at bedside and pt agreed to plan.    Pt improved Taking PO Reports nausea improved He has h/o chronic abd pain.  He reports due to vomiting he was unable to take his pain meds I doubt acute abd process at this time Stable for d/c home with f/u with PCP this week for pain management   Labs Review Labs Reviewed  I-STAT CHEM 8, ED - Abnormal; Notable for the following:    Glucose, Bld 112 (*)    Hemoglobin 17.3 (*)    All other components within normal limits  I-STAT CG4 LACTIC ACID, ED - Abnormal; Notable for the following:    Lactic Acid, Venous 2.48 (*)    All other components within normal limits  I-STAT CG4 LACTIC ACID, ED    EKG Interpretation  Date/Time:  Sunday October 15 2013 12:46:11 EDT Ventricular Rate:  87 PR Interval:  157 QRS Duration: 91 QT Interval:  376 QTC Calculation: 452 R Axis:   30 Text Interpretation:  Sinus rhythm Normal ECG Confirmed by Christy Gentles  MD, Elenore Rota ([id number]) on 10/15/2013 12:52:28 PM        MDM   Final diagnoses:  Abdominal pain, unspecified abdominal location  Non-intractable vomiting with nausea, vomiting of unspecified type    Nursing notes including past medical history and  social history reviewed and considered in documentation Labs/vital reviewed and considered   I personally performed the services described in this documentation, which was scribed in my presence. The recorded information has been reviewed and is accurate.      Oscar Cable, MD 10/15/13 [phone]

## 2013-10-15 NOTE — ED Notes (Signed)
Pt c/o abd pain with n/v/d Thursday.  Reports was evaluated here and sent home.  Says got a little better but started having same symptoms again at 3:00 this morning.

## 2013-11-03 ENCOUNTER — Encounter (INDEPENDENT_AMBULATORY_CARE_PROVIDER_SITE_OTHER): Payer: Self-pay

## 2013-11-03 ENCOUNTER — Encounter (HOSPITAL_COMMUNITY): Payer: Self-pay | Admitting: Psychiatry

## 2013-11-03 ENCOUNTER — Ambulatory Visit (INDEPENDENT_AMBULATORY_CARE_PROVIDER_SITE_OTHER): Payer: MEDICAID | Admitting: Psychiatry

## 2013-11-03 VITALS — BP 120/76 | HR 86 | Ht 71.0 in | Wt 255.0 lb

## 2013-11-03 DIAGNOSIS — F3113 Bipolar disorder, current episode manic without psychotic features, severe: Secondary | ICD-10-CM

## 2013-11-03 DIAGNOSIS — F329 Major depressive disorder, single episode, unspecified: Secondary | ICD-10-CM | POA: Diagnosis not present

## 2013-11-03 DIAGNOSIS — F4310 Post-traumatic stress disorder, unspecified: Secondary | ICD-10-CM | POA: Insufficient documentation

## 2013-11-03 MED ORDER — LAMOTRIGINE 25 MG PO TABS: ORAL_TABLET | ORAL | Status: DC
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
## 2013-11-03 NOTE — Patient Instructions (Signed)
Watch rash on lamictal. We will keep dose to upto 50mg  for now. Report if rash and stop lamictal.

## 2013-11-17 ENCOUNTER — Telehealth: Payer: Self-pay | Admitting: *Deleted

## 2013-11-17 ENCOUNTER — Telehealth: Payer: Self-pay | Admitting: Internal Medicine

## 2013-11-17 NOTE — Telephone Encounter (Signed)
Patient wife calling in regards to husband needing rescue inhalers and asthma inhaler. Per wife he had a discussion with Dr. Doyle Askew about this. However this was not mentioned in the chart. Informed patient's wife he will need to see a provider to discuss this and get prescriptions. Patient given an appointment for 11/30/2013.

## 2013-11-17 NOTE — Telephone Encounter (Signed)
Called pt to reschedule f/u appt that was scheduled 11/20/13 (provider will not be in office). After viewing pt's chart I noticed that the pt was due to come back in six months. Pt states he thought this appt was needed for medication refill. Informed pt that since he was just seen in off. Last month (7/16) then a off visit for meds wasn't necessary. Transferred pt. To nurse line. Pls contact pt.

## 2013-11-20 ENCOUNTER — Ambulatory Visit: Payer: Self-pay | Admitting: Internal Medicine

## 2013-11-30 ENCOUNTER — Telehealth: Payer: Self-pay | Admitting: *Deleted

## 2013-11-30 ENCOUNTER — Encounter: Payer: Self-pay | Admitting: Internal Medicine

## 2013-11-30 ENCOUNTER — Ambulatory Visit: Payer: Self-pay | Attending: Internal Medicine | Admitting: Internal Medicine

## 2013-11-30 VITALS — BP 128/70 | HR 83 | Temp 98.5°F | Resp 16 | Ht 71.0 in | Wt 259.8 lb

## 2013-11-30 DIAGNOSIS — J4489 Other specified chronic obstructive pulmonary disease: Secondary | ICD-10-CM | POA: Insufficient documentation

## 2013-11-30 DIAGNOSIS — M25519 Pain in unspecified shoulder: Secondary | ICD-10-CM | POA: Insufficient documentation

## 2013-11-30 DIAGNOSIS — J449 Chronic obstructive pulmonary disease, unspecified: Secondary | ICD-10-CM | POA: Insufficient documentation

## 2013-11-30 DIAGNOSIS — IMO0001 Reserved for inherently not codable concepts without codable children: Secondary | ICD-10-CM

## 2013-11-30 DIAGNOSIS — G8929 Other chronic pain: Secondary | ICD-10-CM | POA: Insufficient documentation

## 2013-11-30 DIAGNOSIS — I2510 Atherosclerotic heart disease of native coronary artery without angina pectoris: Secondary | ICD-10-CM | POA: Insufficient documentation

## 2013-11-30 DIAGNOSIS — G894 Chronic pain syndrome: Secondary | ICD-10-CM | POA: Insufficient documentation

## 2013-11-30 DIAGNOSIS — I10 Essential (primary) hypertension: Secondary | ICD-10-CM | POA: Insufficient documentation

## 2013-11-30 DIAGNOSIS — Z23 Encounter for immunization: Secondary | ICD-10-CM | POA: Insufficient documentation

## 2013-11-30 DIAGNOSIS — E785 Hyperlipidemia, unspecified: Secondary | ICD-10-CM | POA: Insufficient documentation

## 2013-11-30 DIAGNOSIS — K7689 Other specified diseases of liver: Secondary | ICD-10-CM | POA: Insufficient documentation

## 2013-11-30 DIAGNOSIS — F4310 Post-traumatic stress disorder, unspecified: Secondary | ICD-10-CM | POA: Insufficient documentation

## 2013-11-30 MED ORDER — LISINOPRIL-HYDROCHLOROTHIAZIDE 20-25 MG PO TABS: 0.5000 | ORAL_TABLET | Freq: Every morning | ORAL | Status: DC

## 2013-11-30 MED ORDER — AMITRIPTYLINE HCL 25 MG PO TABS: 25.0000 mg | ORAL_TABLET | Freq: Every day | ORAL | Status: DC

## 2013-11-30 MED ORDER — EZETIMIBE-SIMVASTATIN 10-10 MG PO TABS: 1.0000 | ORAL_TABLET | Freq: Every day | ORAL | Status: DC

## 2013-11-30 MED ORDER — ACETAMINOPHEN-CODEINE #3 300-30 MG PO TABS: 1.0000 | ORAL_TABLET | ORAL | Status: DC | PRN

## 2013-11-30 MED ORDER — ALBUTEROL SULFATE HFA 108 (90 BASE) MCG/ACT IN AERS: 2.0000 | INHALATION_SPRAY | Freq: Four times a day (QID) | RESPIRATORY_TRACT | Status: DC | PRN

## 2013-11-30 NOTE — Patient Instructions (Signed)
Smoking Cessation Quitting smoking is important to your health and has many advantages. However, it is not always easy to quit since nicotine is a very addictive drug. Oftentimes, people try 3 times or more before being able to quit. This document explains the best ways for you to prepare to quit smoking. Quitting takes hard work and a lot of effort, but you can do it. ADVANTAGES OF QUITTING SMOKING  You will live longer, feel better, and live better.  Your body will feel the impact of quitting smoking almost immediately.  Within 20 minutes, blood pressure decreases. Your pulse returns to its normal level.  After 8 hours, carbon monoxide levels in the blood return to normal. Your oxygen level increases.  After 24 hours, the chance of having a heart attack starts to decrease. Your breath, hair, and body stop smelling like smoke.  After 48 hours, damaged nerve endings begin to recover. Your sense of taste and smell improve.  After 72 hours, the body is virtually free of nicotine. Your bronchial tubes relax and breathing becomes easier.  After 2 to 12 weeks, lungs can hold more air. Exercise becomes easier and circulation improves.  The risk of having a heart attack, stroke, cancer, or lung disease is greatly reduced.  After 1 year, the risk of coronary heart disease is cut in half.  After 5 years, the risk of stroke falls to the same as a nonsmoker.  After 10 years, the risk of lung cancer is cut in half and the risk of other cancers decreases significantly.  After 15 years, the risk of coronary heart disease drops, usually to the level of a nonsmoker.  If you are pregnant, quitting smoking will improve your chances of having a healthy baby.  The people you live with, especially any children, will be healthier.  You will have extra money to spend on things other than cigarettes. QUESTIONS TO THINK ABOUT BEFORE ATTEMPTING TO QUIT You may want to talk about your answers with your  health care provider.  Why do you want to quit?  If you tried to quit in the past, what helped and what did not?  What will be the most difficult situations for you after you quit? How will you plan to handle them?  Who can help you through the tough times? Your family? Friends? A health care provider?  What pleasures do you get from smoking? What ways can you still get pleasure if you quit? Here are some questions to ask your health care provider:  How can you help me to be successful at quitting?  What medicine do you think would be best for me and how should I take it?  What should I do if I need more help?  What is smoking withdrawal like? How can I get information on withdrawal? GET READY  Set a quit date.  Change your environment by getting rid of all cigarettes, ashtrays, matches, and lighters in your home, car, or work. Do not let people smoke in your home.  Review your past attempts to quit. Think about what worked and what did not. GET SUPPORT AND ENCOURAGEMENT You have a better chance of being successful if you have help. You can get support in many ways.  Tell your family, friends, and coworkers that you are going to quit and need their support. Ask them not to smoke around you.  Get individual, group, or telephone counseling and support. Programs are available at local hospitals and health centers. Call   your local health department for information about programs in your area. °· Spiritual beliefs and practices may help some smokers quit. °· Download a "quit meter" on your computer to keep track of quit statistics, such as how long you have gone without smoking, cigarettes not smoked, and money saved. °· Get a self-help book about quitting smoking and staying off tobacco. °LEARN NEW SKILLS AND BEHAVIORS °· Distract yourself from urges to smoke. Talk to someone, go for a walk, or occupy your time with a task. °· Change your normal routine. Take a different route to work.  Drink tea instead of coffee. Eat breakfast in a different place. °· Reduce your stress. Take a hot bath, exercise, or read a book. °· Plan something enjoyable to do every day. Reward yourself for not smoking. °· Explore interactive web-based programs that specialize in helping you quit. °GET MEDICINE AND USE IT CORRECTLY °Medicines can help you stop smoking and decrease the urge to smoke. Combining medicine with the above behavioral methods and support can greatly increase your chances of successfully quitting smoking. °· Nicotine replacement therapy helps deliver nicotine to your body without the negative effects and risks of smoking. Nicotine replacement therapy includes nicotine gum, lozenges, inhalers, nasal sprays, and skin patches. Some may be available over-the-counter and others require a prescription. °· Antidepressant medicine helps people abstain from smoking, but how this works is unknown. This medicine is available by prescription. °· Nicotinic receptor partial agonist medicine simulates the effect of nicotine in your brain. This medicine is available by prescription. °Ask your health care provider for advice about which medicines to use and how to use them based on your health history. Your health care provider will tell you what side effects to look out for if you choose to be on a medicine or therapy. Carefully read the information on the package. Do not use any other product containing nicotine while using a nicotine replacement product.  °RELAPSE OR DIFFICULT SITUATIONS °Most relapses occur within the first 3 months after quitting. Do not be discouraged if you start smoking again. Remember, most people try several times before finally quitting. You may have symptoms of withdrawal because your body is used to nicotine. You may crave cigarettes, be irritable, feel very hungry, cough often, get headaches, or have difficulty concentrating. The withdrawal symptoms are only temporary. They are strongest  when you first quit, but they will go away within 10-14 days. °To reduce the chances of relapse, try to: °· Avoid drinking alcohol. Drinking lowers your chances of successfully quitting. °· Reduce the amount of caffeine you consume. Once you quit smoking, the amount of caffeine in your body increases and can give you symptoms, such as a rapid heartbeat, sweating, and anxiety. °· Avoid smokers because they can make you want to smoke. °· Do not let weight gain distract you. Many smokers will gain weight when they quit, usually less than 10 pounds. Eat a healthy diet and stay active. You can always lose the weight gained after you quit. °· Find ways to improve your mood other than smoking. °FOR MORE INFORMATION  °www.smokefree.gov  °Document Released: 03/03/2001 Document Revised: 07/24/2013 Document Reviewed: 06/18/2011 °[employer]® Patient Information ©2015 [employer]. This information is not intended to replace advice given to you by your health care provider. Make sure you discuss any questions you have with your health care provider. °Hypertension °Hypertension, commonly called high blood pressure, is when the force of blood pumping through your arteries is too strong. Your arteries   are the blood vessels that carry blood from your heart throughout your body. A blood pressure reading consists of a higher number over a lower number, such as 110/72. The higher number (systolic) is the pressure inside your arteries when your heart pumps. The lower number (diastolic) is the pressure inside your arteries when your heart relaxes. Ideally you want your blood pressure below 120/80. °Hypertension forces your heart to work harder to pump blood. Your arteries may become narrow or stiff. Having hypertension puts you at risk for heart disease, stroke, and other problems.  °RISK FACTORS °Some risk factors for high blood pressure are controllable. Others are not.  °Risk factors you cannot control include:  °· Race. You may be at  higher risk if you are African American. °· Age. Risk increases with age. °· Gender. Men are at higher risk than women before age 45 years. After age 65, women are at higher risk than men. °Risk factors you can control include: °· Not getting enough exercise or physical activity. °· Being overweight. °· Getting too much fat, sugar, calories, or salt in your diet. °· Drinking too much alcohol. °SIGNS AND SYMPTOMS °Hypertension does not usually cause signs or symptoms. Extremely high blood pressure (hypertensive crisis) may cause headache, anxiety, shortness of breath, and nosebleed. °DIAGNOSIS  °To check if you have hypertension, your health care provider will measure your blood pressure while you are seated, with your arm held at the level of your heart. It should be measured at least twice using the same arm. Certain conditions can cause a difference in blood pressure between your right and left arms. A blood pressure reading that is higher than normal on one occasion does not mean that you need treatment. If one blood pressure reading is high, ask your health care provider about having it checked again. °TREATMENT  °Treating high blood pressure includes making lifestyle changes and possibly taking medicine. Living a healthy lifestyle can help lower high blood pressure. You may need to change some of your habits. °Lifestyle changes may include: °· Following the DASH diet. This diet is high in fruits, vegetables, and whole grains. It is low in salt, red meat, and added sugars. °· Getting at least 2½ hours of brisk physical activity every week. °· Losing weight if necessary. °· Not smoking. °· Limiting alcoholic beverages. °· Learning ways to reduce stress. ° If lifestyle changes are not enough to get your blood pressure under control, your health care provider may prescribe medicine. You may need to take more than one. Work closely with your health care provider to understand the risks and benefits. °HOME CARE  INSTRUCTIONS °· Have your blood pressure rechecked as directed by your health care provider.   °· Take medicines only as directed by your health care provider. Follow the directions carefully. Blood pressure medicines must be taken as prescribed. The medicine does not work as well when you skip doses. Skipping doses also puts you at risk for problems.   °· Do not smoke.   °· Monitor your blood pressure at home as directed by your health care provider.  °SEEK MEDICAL CARE IF:  °· You think you are having a reaction to medicines taken. °· You have recurrent headaches or feel dizzy. °· You have swelling in your ankles. °· You have trouble with your vision. °SEEK IMMEDIATE MEDICAL CARE IF: °· You develop a severe headache or confusion. °· You have unusual weakness, numbness, or feel faint. °· You have severe chest or abdominal pain. °· You vomit repeatedly. °·   You have trouble breathing. MAKE SURE YOU:   Understand these instructions.  Will watch your condition.  Will get help right away if you are not doing well or get worse. Document Released: 03/09/2005 Document Revised: 07/24/2013 Document Reviewed: 12/30/2012 [facility] Patient Information 2015 [place], [place]. This information is not intended to replace advice given to you by your health care provider. Make sure you discuss any questions you have with your health care provider. Chronic Obstructive Pulmonary Disease Chronic obstructive pulmonary disease (COPD) is a common lung condition in which airflow from the lungs is limited. COPD is a general term that can be used to describe many different lung problems that limit airflow, including both chronic bronchitis and emphysema. If you have COPD, your lung function will probably never return to normal, but there are measures you can take to improve lung function and make yourself feel better.  CAUSES   Smoking (common).   Exposure to secondhand smoke.   Genetic problems.  Chronic inflammatory  lung diseases or recurrent infections. SYMPTOMS   Shortness of breath, especially with physical activity.   Deep, persistent (chronic) cough with a large amount of thick mucus.   Wheezing.   Rapid breaths (tachypnea).   Gray or bluish discoloration (cyanosis) of the skin, especially in fingers, toes, or lips.   Fatigue.   Weight loss.   Frequent infections or episodes when breathing symptoms become much worse (exacerbations).   Chest tightness. DIAGNOSIS  Your health care provider will take a medical history and perform a physical examination to make the initial diagnosis. Additional tests for COPD may include:   Lung (pulmonary) function tests.  Chest X-ray.  CT scan.  Blood tests. TREATMENT  Treatment available to help you feel better when you have COPD includes:   Inhaler and nebulizer medicines. These help manage the symptoms of COPD and make your breathing more comfortable.  Supplemental oxygen. Supplemental oxygen is only helpful if you have a low oxygen level in your blood.   Exercise and physical activity. These are beneficial for nearly all people with COPD. Some people may also benefit from a pulmonary rehabilitation program. HOME CARE INSTRUCTIONS   Take all medicines (inhaled or pills) as directed by your health care provider.  Avoid over-the-counter medicines or cough syrups that dry up your airway (such as antihistamines) and slow down the elimination of secretions unless instructed otherwise by your health care provider.   If you are a smoker, the most important thing that you can do is stop smoking. Continuing to smoke will cause further lung damage and breathing trouble. Ask your health care provider for help with quitting smoking. He or she can direct you to community resources or hospitals that provide support.  Avoid exposure to irritants such as smoke, chemicals, and fumes that aggravate your breathing.  Use oxygen therapy and pulmonary  rehabilitation if directed by your health care provider. If you require home oxygen therapy, ask your health care provider whether you should purchase a pulse oximeter to measure your oxygen level at home.   Avoid contact with individuals who have a contagious illness.  Avoid extreme temperature and humidity changes.  Eat healthy foods. Eating smaller, more frequent meals and resting before meals may help you maintain your strength.  Stay active, but balance activity with periods of rest. Exercise and physical activity will help you maintain your ability to do things you want to do.  Preventing infection and hospitalization is very important when you have COPD. Make sure to  receive all the vaccines your health care provider recommends, especially the pneumococcal and influenza vaccines. Ask your health care provider whether you need a pneumonia vaccine.  Learn and use relaxation techniques to manage stress.  Learn and use controlled breathing techniques as directed by your health care provider. Controlled breathing techniques include:   Pursed lip breathing. Start by breathing in (inhaling) through your nose for 1 second. Then, purse your lips as if you were going to whistle and breathe out (exhale) through the pursed lips for 2 seconds.   Diaphragmatic breathing. Start by putting one hand on your abdomen just above your waist. Inhale slowly through your nose. The hand on your abdomen should move out. Then purse your lips and exhale slowly. You should be able to feel the hand on your abdomen moving in as you exhale.   Learn and use controlled coughing to clear mucus from your lungs. Controlled coughing is a series of short, progressive coughs. The steps of controlled coughing are:  1. Lean your head slightly forward.  2. Breathe in deeply using diaphragmatic breathing.  3. Try to hold your breath for 3 seconds.  4. Keep your mouth slightly open while coughing twice.  5. Spit any  mucus out into a tissue.  6. Rest and repeat the steps once or twice as needed. SEEK MEDICAL CARE IF:   You are coughing up more mucus than usual.   There is a change in the color or thickness of your mucus.   Your breathing is more labored than usual.   Your breathing is faster than usual.  SEEK IMMEDIATE MEDICAL CARE IF:   You have shortness of breath while you are resting.   You have shortness of breath that prevents you from:  Being able to talk.   Performing your usual physical activities.   You have chest pain lasting longer than 5 minutes.   Your skin color is more cyanotic than usual.  You measure low oxygen saturations for longer than 5 minutes with a pulse oximeter. MAKE SURE YOU:   Understand these instructions.  Will watch your condition.  Will get help right away if you are not doing well or get worse. Document Released: 12/17/2004 Document Revised: 07/24/2013 Document Reviewed: 11/03/2012 [facility] Patient Information 2015 [place], [place]. This information is not intended to replace advice given to you by your health care provider. Make sure you discuss any questions you have with your health care provider.

## 2013-11-30 NOTE — Progress Notes (Signed)
Patient presents for f/u on COPD, HTN and hyperlipidemia C/O chronic neck and back pain; rates 7/10 at present States 2 month history of dizziness with standing Needs med refills Wife present

## 2013-11-30 NOTE — Telephone Encounter (Signed)
Patient left without receiving Rx and flu vaccine. Called patient and he will return for these today.

## 2013-11-30 NOTE — Progress Notes (Signed)
Subjective:     Patient ID: Oscar Hall, male   DOB: 1960/09/28, 53 y.o.   MRN: [id number]  Oscar Hall is a 53 year old male who presents today for follow-up of HTN, "COPD," and chronic shoulder pain.  The patient reports recent shoulder surgery at [facility] and has completed physical therapy.  The pain is aching, present constantly, is rated at a 7/10, and is slightly relieved with pain medication.  The patient endorses that the pain is affecting his sleep at times.  During his hospitalization, he stated that the MD told him he "had COPD" and prescribed him an inhaler.  The inhaler relieved the SOB that he was experiencing with exertion.  He has run out of the medication and is requesting a refill.  He complains of SOB with exertion, especially if outside.  He states that he often wheezes and then develops a productive cough for several days afterward.  Denies fever, chills, chest pain, N/V.   Hypertension Associated symptoms include shortness of breath.     Review of Systems  Constitutional: Negative.   HENT: Negative.        Congested cough intermittently  Eyes: Negative.   Respiratory: Positive for cough and shortness of breath.   Cardiovascular: Negative.   Endocrine: Negative.   Genitourinary: Negative.   Musculoskeletal: Positive for arthralgias.  Neurological: Positive for dizziness.  Psychiatric/Behavioral: Negative.        Objective:   Physical Exam  Constitutional: He is oriented to person, place, and time. He appears well-developed and well-nourished. No distress.  HENT:  Head: Normocephalic and atraumatic.  Eyes: Conjunctivae and EOM are normal. Pupils are equal, round, and reactive to light.  Neck: Normal range of motion. Neck supple.  Cardiovascular: Normal rate and normal heart sounds.   No murmur heard. Pulmonary/Chest: No accessory muscle usage. Not tachypneic. No respiratory distress. He has wheezes in the right lower field and the left lower field. He has no  rales. He exhibits no tenderness.  Abdominal: Soft. Bowel sounds are normal.  Lymphadenopathy:    He has no cervical adenopathy.  Neurological: He is alert and oriented to person, place, and time. No cranial nerve deficit.  Skin: Skin is warm and dry.  Psychiatric: He has a normal mood and affect. His behavior is normal.       Assessment:         Plan:    Assessment and Plan:  A-Wheezing P- albuterol inhaler PRN wheezing, SOB      PFTs- assess pt's respiratory status for COPD.  If abnormal, manage outpatient with [facility]      Smoking cessation- pt counciled extensively in need to stop smoking.  Pt has cut back to 3/4 ppd         from 2.5 ppd.  Has been able to stop smoking for 18 months before. Encouraged to commit to quit.  A-HTN P- well controlled on current regimen.  Continue lisinopril-HCTZ      DASH diet and wt loss discussed  A-Chronic Pain P- Tylenol #3 PRN pain      Pain clinic referral  A-PTSD P- Continue Elavil. Refill provided  A-Bipolar 1 P- controlled on lamtical  Oscar Hall, Oscar Hall 11/30/2013 3:21 PM

## 2013-12-01 ENCOUNTER — Ambulatory Visit (INDEPENDENT_AMBULATORY_CARE_PROVIDER_SITE_OTHER): Payer: MEDICAID | Admitting: Psychiatry

## 2013-12-01 ENCOUNTER — Encounter (HOSPITAL_COMMUNITY): Payer: Self-pay | Admitting: Psychiatry

## 2013-12-01 VITALS — BP 114/72 | HR 82 | Ht 71.0 in | Wt 264.0 lb

## 2013-12-01 DIAGNOSIS — F3113 Bipolar disorder, current episode manic without psychotic features, severe: Secondary | ICD-10-CM

## 2013-12-01 DIAGNOSIS — F4310 Post-traumatic stress disorder, unspecified: Secondary | ICD-10-CM

## 2013-12-01 DIAGNOSIS — F329 Major depressive disorder, single episode, unspecified: Secondary | ICD-10-CM

## 2013-12-01 MED ORDER — LAMOTRIGINE 25 MG PO TABS: ORAL_TABLET | ORAL | Status: DC
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
## 2013-12-03 NOTE — Progress Notes (Signed)
Patient ID: Oscar Hall, male   DOB: [birth year], 53 y.o.   MRN: [id number]   Oscar Hall, is a 53 y.o. male  MPN:[id number]  MGQ:[id number]  DOB - 18-Jul-1960  Chief Complaint  Patient presents with  . Follow-up  . COPD  . Hypertension        Subjective:   Oscar Hall is a 53 y.o. male here today for a follow up visit. Patient has history of hyperlipidemia, hypertension, coronary artery disease, PTSD and fatty liver. He presents today for follow-up of HTN, "COPD," and chronic shoulder pain. The patient reports recent shoulder surgery at [facility] and has completed physical therapy. The pain is aching, present constantly, is rated at a 7/10, and is slightly relieved with pain medication. The patient endorses that the pain is affecting his sleep at times. During his hospitalization, he stated that the MD told him he "had COPD" and prescribed him an inhaler. The inhaler relieved the SOB that he was experiencing with exertion. He has run out of the medication and is requesting a refill. He complains of SOB with exertion, especially if outside. He states that he often wheezes and then develops a productive cough for several days afterward. Denies fever, chills, chest pain, N/V.  No problems updated.  ALLERGIES: Allergies  Allergen Reactions  . Penicillins Anaphylaxis and Swelling  . Vistaril [Hydroxyzine Hcl] Hives and Shortness Of Breath  . Celebrex [Celecoxib] Other (See Comments)    G.I. Upset  . Lithium Other (See Comments)    Increases blood pressure.  . Neurontin [Gabapentin] Nausea And Vomiting  . Soma [Carisoprodol] Other (See Comments)    G.I. Upset   . Sulfa Antibiotics Hives and Itching  . Toradol [Ketorolac Tromethamine] Other (See Comments)    G.I. Upset   . Tramadol Other (See Comments)    G.IMariah Hall     PAST MEDICAL HISTORY: Past Medical History  Diagnosis Date  . PTSD (post-traumatic stress disorder)   . Coronary artery disease   . Hypercholesterolemia   .  Fatty liver   . Hypercholesteremia   . Hypertension   . COPD (chronic obstructive pulmonary disease)     MEDICATIONS AT HOME: Prior to Admission medications   Medication Sig Start Date End Date Taking? Authorizing Provider  amitriptyline (ELAVIL) 25 MG tablet Take 1-2 tablets (25-50 mg total) by mouth at bedtime. 11/30/13  Yes Tresa Garter, MD  ezetimibe-simvastatin (VYTORIN) 10-10 MG per tablet Take 1 tablet by mouth at bedtime. 11/30/13  Yes Tresa Garter, MD  lisinopril-hydrochlorothiazide (PRINZIDE,ZESTORETIC) 20-25 MG per tablet Take 0.5 tablets by mouth every morning. 11/30/13  Yes Tresa Garter, MD  acetaminophen-codeine (TYLENOL #3) 300-30 MG per tablet Take 1 tablet by mouth every 4 (four) hours as needed. 11/30/13   Tresa Garter, MD  albuterol (PROVENTIL HFA;VENTOLIN HFA) 108 (90 BASE) MCG/ACT inhaler Inhale 2 puffs into the lungs every 6 (six) hours as needed for wheezing or shortness of breath. 11/30/13   Tresa Garter, MD  lamoTRIgine (LAMICTAL) 25 MG tablet Take one tablet daily for a week and then start taking 2 tablets. Generic OK. 12/01/13   Merian Capron, MD  ondansetron (ZOFRAN) 4 MG tablet Take 1 tablet (4 mg total) by mouth every 6 (six) hours. 10/12/13   Virgel Manifold, MD  oxyCODONE (ROXICODONE) 5 MG immediate release tablet Take 1 tablet (5 mg total) by mouth every 6 (six) hours as needed for severe pain. 10/05/13   Theodis Blaze, MD  Objective:   Filed Vitals:   11/30/13 1417  BP: 128/70  Pulse: 83  Temp: 98.5 F (36.9 C)  TempSrc: Oral  Resp: 16  Height: 5\' 11"  (1.803 m)  Weight: 259 lb 12.8 oz (117.845 kg)  SpO2: 97%    Exam General appearance : Awake, alert, not in any distress. Speech Clear. Not toxic looking HEENT: Atraumatic and Normocephalic, pupils equally reactive to light and accomodation Neck: supple, no JVD. No cervical lymphadenopathy.  Chest: Scattered wheezes bilaterally, no crepitation, no Rales CVS: S1 S2  regular, no murmurs.  Abdomen: Bowel sounds present, Non tender and not distended with no gaurding, rigidity or rebound. Extremities: B/L Lower Ext shows no edema, both legs are warm to touch Neurology: Awake alert, and oriented X 3, CN II-XII intact, Non focal Skin:No Rash Wounds:N/A  Data Review Lab Results  Component Value Date   HGBA1C 5.7* 02/13/2013     Assessment & Plan   1. Need for prophylactic vaccination and inoculation against influenza Given  2. Chronic pain syndrome  - amitriptyline (ELAVIL) 25 MG tablet; Take 1-2 tablets (25-50 mg total) by mouth at bedtime.  Dispense: 90 tablet; Refill: 3 - acetaminophen-codeine (TYLENOL #3) 300-30 MG per tablet; Take 1 tablet by mouth every 4 (four) hours as needed.  Dispense: 60 tablet; Refill: 0 - Ambulatory referral to pain clinic  3. Essential hypertension: Controlled Refill - lisinopril-hydrochlorothiazide (PRINZIDE,ZESTORETIC) 20-25 MG per tablet; Take 0.5 tablets by mouth every morning.  Dispense: 90 tablet; Refill: 3 - ezetimibe-simvastatin (VYTORIN) 10-10 MG per tablet; Take 1 tablet by mouth at bedtime.  Dispense: 90 tablet; Refill: 3 - DASH diet  4. COPD bronchitis  - albuterol (PROVENTIL HFA;VENTOLIN HFA) 108 (90 BASE) MCG/ACT inhaler; Inhale 2 puffs into the lungs every 6 (six) hours as needed for wheezing or shortness of breath.  Dispense: 3 Inhaler; Refill: 3  - Pulmonary function test; Future  Patient was extensively counseled on nutrition and exercise Patient was counseled extensively on smoking cessation.   Return for Follow up HTN, COPD.  The patient was given clear instructions to go to ER or return to medical center if symptoms don't improve, worsen or new problems develop. The patient verbalized understanding. The patient was told to call to get lab results if they haven't heard anything in the next week.   This note has been created with Automotive engineer. Any transcriptional errors are unintentional.    Angelica Chessman, MD, [place], [place], [place] and [place], [place]   12/03/2013, 9:17 PM

## 2013-12-08 ENCOUNTER — Ambulatory Visit (INDEPENDENT_AMBULATORY_CARE_PROVIDER_SITE_OTHER): Payer: Self-pay | Admitting: Pharmacist

## 2013-12-08 ENCOUNTER — Encounter: Payer: Self-pay | Admitting: Pharmacist

## 2013-12-08 VITALS — BP 152/83 | HR 87 | Ht 71.0 in | Wt 260.0 lb

## 2013-12-08 DIAGNOSIS — J4489 Other specified chronic obstructive pulmonary disease: Secondary | ICD-10-CM

## 2013-12-08 DIAGNOSIS — IMO0001 Reserved for inherently not codable concepts without codable children: Secondary | ICD-10-CM

## 2013-12-08 DIAGNOSIS — F17200 Nicotine dependence, unspecified, uncomplicated: Secondary | ICD-10-CM

## 2013-12-08 DIAGNOSIS — J449 Chronic obstructive pulmonary disease, unspecified: Secondary | ICD-10-CM

## 2013-12-08 NOTE — Progress Notes (Signed)
S:    Patient arrives alone and in general good health. Presents to clinic today for lung function evaluation and for evaluation/assistance with tobacco dependence.   Age when started using tobacco on a daily basis: 28. Number of Cigarettes per day: 15. Brand smoked [employer] . Estimated Nicotine Content per Cigarette: 1.2 mg.  Estimated Nicotine intake per day: 20 mg.   Smokes first cigarette 60 minutes after waking. Denies waking up at night to smoke  Most recent quit attempt: started cutting down a year ago (was smoking 2 1/2 packs per day, down to 3/4 packs per day now) Longest time ever been tobacco free: 18 months. What Medications (NRT, bupropion, varenicline) used in past: none Rates IMPORTANCE of quitting tobacco on 1-10 scale of 8. Rates READINESS of quitting tobacco on 1-10 scale of 8. Rates CONFIDENCE of quitting tobacco on 1-10 scale of 5. Triggers to use tobacco include; smelling cigarettes, being around other people who smoke, post-meals.  Patient recently started using albuterol to help with shortness of breath, which he reports using once daily in the morning. His wife currently smokes, interested in the 2 of them trying to quit together. Eating hard candy after meals would help him avoid smoking in the past. Also enjoys drawing and does not smoke when he draws. Ready to start cutting down # of cigarettes smoked per day. Patient sets goal for next month to cut back from 3/4 of a pack to 1/2 pack per day. Thinks he will be ready to fully quit by Christmas of this year. Does not want to try medication at this point, would rather quit cold [place] like he has tried in the past.  O: CAT score= 21 See Documentation Flowsheet - CAT/COPD for complete symptom scoring.  See "scanned report" or Documentation Flowsheet (discrete results - PFTs) for  Spirometry results. Patient provided good effort while attempting spirometry.   Lung Age = 76 Albuterol Neb  Lot# [id number]    Exp.  04/17  A/P:  Spirometry evaluation reveals mild restrictive lung disease. Post nebulized albuterol He will continue using his albuterol inhaler, although he may require a long-acting agent in the future to help with his shortness of breath and coughing. tx revealed significant improvement in FEV1 of 21%. Reviewed results of pulmonary function tests with patient, who verbalized understanding of results. Consider future trial of Spiriva.   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 However, he reports that his wife smoking will be a barrier to him quitting. Will discuss with his wife trying to quit smoking together, and until then having her smoke outside so that he is not around the smell. Patient-reported goal to cut back to smoking 1/2 pack per day over the next month with a long term goal of quitting by Christmas. Patient is not currently interested in trying any medications to help him quit smoking. Encouraged patient to go for walks outside to escape smell of smoke and to continue eating hard candy after meals, which has previously helped him to avoid smoking post-meal. Written information provided. Patient will follow up with [facility]. Total time in face-to-face counseling 60 minutes.  Patient seen with  Fuller Canada,  PharmD Resident and Nicholas Lose, PharmD.

## 2013-12-08 NOTE — Assessment & Plan Note (Signed)
Spirometry evaluation reveals mild restrictive lung disease. Post nebulized albuterol He will continue using his albuterol inhaler, although he may require a long-acting agent in the future to help with his shortness of breath and coughing. tx revealed significant improvement in FEV1 of 21%. Reviewed results of pulmonary function tests with patient, who verbalized understanding of results. Consider future trial of Spiriva.  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 However, he reports that his wife smoking will be a barrier to him quitting. Will discuss with his wife trying to quit smoking together, and until then having her smoke outside so that he is not around the smell. Patient-reported goal to cut back to smoking 1/2 pack per day over the next month with a long term goal of quitting by Christmas. Patient is not currently interested in trying any medications to help him quit smoking. Encouraged patient to go for walks outside to escape smell of smoke and to continue eating hard candy after meals, which has previously helped him to avoid smoking post-meal

## 2013-12-08 NOTE — Patient Instructions (Addendum)
Your lung function test today showed mild obstruction. Continue using albuterol inhaler to help with breathing. Discuss with wife trying to quit smoking together and have her smoke outside instead. Goal for the next few weeks: try to cut back to smoking 1/2 pack per day. Long-term goal: try to fully quit smoking by Christmas.  Continue to go for walks outside to stay away from cigarette smell as much as possible. Continue to eat a piece of hard candy after meals to help avoid smoking after eating. Follow-up with [facility].

## 2013-12-12 ENCOUNTER — Other Ambulatory Visit: Payer: Self-pay | Admitting: Internal Medicine

## 2013-12-12 DIAGNOSIS — I10 Essential (primary) hypertension: Secondary | ICD-10-CM

## 2013-12-12 MED ORDER — EZETIMIBE-SIMVASTATIN 10-10 MG PO TABS: 1.0000 | ORAL_TABLET | Freq: Every day | ORAL | Status: DC

## 2013-12-12 NOTE — Progress Notes (Signed)
Patient ID: Oscar Hall, male   DOB: 10-Jan-1961, 53 y.o.   MRN: [id number] Reviewed: Agree with Dr. Graylin Shiver documentation and management.

## 2014-01-28 ENCOUNTER — Encounter (HOSPITAL_COMMUNITY): Payer: Self-pay | Admitting: Emergency Medicine

## 2014-01-28 ENCOUNTER — Emergency Department (HOSPITAL_COMMUNITY)
Admission: EM | Admit: 2014-01-28 | Discharge: 2014-01-28 | Discharge status: Against Medical Advice | Payer: Self-pay | Attending: Emergency Medicine | Admitting: Emergency Medicine

## 2014-01-28 DIAGNOSIS — I2510 Atherosclerotic heart disease of native coronary artery without angina pectoris: Secondary | ICD-10-CM | POA: Insufficient documentation

## 2014-01-28 DIAGNOSIS — K429 Umbilical hernia without obstruction or gangrene: Secondary | ICD-10-CM | POA: Insufficient documentation

## 2014-01-28 DIAGNOSIS — I10 Essential (primary) hypertension: Secondary | ICD-10-CM | POA: Insufficient documentation

## 2014-01-28 DIAGNOSIS — F4310 Post-traumatic stress disorder, unspecified: Secondary | ICD-10-CM | POA: Insufficient documentation

## 2014-01-28 DIAGNOSIS — Z880 Allergy status to penicillin: Secondary | ICD-10-CM | POA: Insufficient documentation

## 2014-01-28 DIAGNOSIS — E780 Pure hypercholesterolemia: Secondary | ICD-10-CM | POA: Insufficient documentation

## 2014-01-28 DIAGNOSIS — R112 Nausea with vomiting, unspecified: Secondary | ICD-10-CM | POA: Insufficient documentation

## 2014-01-28 DIAGNOSIS — Z720 Tobacco use: Secondary | ICD-10-CM | POA: Insufficient documentation

## 2014-01-28 DIAGNOSIS — Z79899 Other long term (current) drug therapy: Secondary | ICD-10-CM | POA: Insufficient documentation

## 2014-01-28 DIAGNOSIS — M7918 Myalgia, other site: Secondary | ICD-10-CM

## 2014-01-28 DIAGNOSIS — M549 Dorsalgia, unspecified: Secondary | ICD-10-CM | POA: Insufficient documentation

## 2014-01-28 DIAGNOSIS — J449 Chronic obstructive pulmonary disease, unspecified: Secondary | ICD-10-CM | POA: Insufficient documentation

## 2014-01-28 LAB — CBC WITH DIFFERENTIAL/PLATELET
Basophils Absolute: 0.1 10*3/uL (ref 0.0–0.1)
Basophils Relative: 1 % (ref 0–1)
Eosinophils Absolute: 0.7 10*3/uL (ref 0.0–0.7)
Eosinophils Relative: 7 % — ABNORMAL HIGH (ref 0–5)
HCT: 48.2 % (ref 39.0–52.0)
Hemoglobin: 17.6 g/dL — ABNORMAL HIGH (ref 13.0–17.0)
LYMPHS ABS: 2.6 10*3/uL (ref 0.7–4.0)
LYMPHS PCT: 24 % (ref 12–46)
MCH: 33.5 pg (ref 26.0–34.0)
MCHC: 36.5 g/dL — ABNORMAL HIGH (ref 30.0–36.0)
MCV: 91.8 fL (ref 78.0–100.0)
Monocytes Absolute: 0.6 10*3/uL (ref 0.1–1.0)
Monocytes Relative: 5 % (ref 3–12)
Neutro Abs: 7 10*3/uL (ref 1.7–7.7)
Neutrophils Relative %: 63 % (ref 43–77)
PLATELETS: 187 10*3/uL (ref 150–400)
RBC: 5.25 MIL/uL (ref 4.22–5.81)
RDW: 12.4 % (ref 11.5–15.5)
WBC: 11 10*3/uL — AB (ref 4.0–10.5)

## 2014-01-28 LAB — COMPREHENSIVE METABOLIC PANEL
ALK PHOS: 97 U/L (ref 39–117)
ALT: 25 U/L (ref 0–53)
AST: 20 U/L (ref 0–37)
Albumin: 3.9 g/dL (ref 3.5–5.2)
Anion gap: 10 (ref 5–15)
BUN: 10 mg/dL (ref 6–23)
CO2: 30 meq/L (ref 19–32)
Calcium: 10 mg/dL (ref 8.4–10.5)
Chloride: 96 mEq/L (ref 96–112)
Creatinine, Ser: 1.01 mg/dL (ref 0.50–1.35)
GFR calc Af Amer: >90 mL/min (ref >90)
GFR, EST NON AFRICAN AMERICAN: 84 mL/min — AB (ref >90)
GLUCOSE: 103 mg/dL — AB (ref 70–99)
POTASSIUM: 4.2 meq/L (ref 3.7–5.3)
Sodium: 136 mEq/L — ABNORMAL LOW (ref 137–147)
TOTAL PROTEIN: 8.1 g/dL (ref 6.0–8.3)
Total Bilirubin: 0.6 mg/dL (ref 0.3–1.2)

## 2014-01-28 LAB — URINALYSIS, ROUTINE W REFLEX MICROSCOPIC
Bilirubin Urine: NEGATIVE
Glucose, UA: NEGATIVE mg/dL
Ketones, ur: NEGATIVE mg/dL
Leukocytes, UA: NEGATIVE
Nitrite: NEGATIVE
Protein, ur: NEGATIVE mg/dL
SPECIFIC GRAVITY, URINE: 1.01 (ref 1.005–1.030)
UROBILINOGEN UA: 0.2 mg/dL (ref 0.0–1.0)
pH: 6.5 (ref 5.0–8.0)

## 2014-01-28 LAB — URINE MICROSCOPIC-ADD ON

## 2014-01-28 MED ORDER — ONDANSETRON HCL 4 MG/2ML IJ SOLN
4.0000 mg | Freq: Once | INTRAMUSCULAR | Status: AC
  Administered 2014-01-28: 4 mg via INTRAVENOUS
  Filled 2014-01-28: qty 2

## 2014-01-28 MED ORDER — ONDANSETRON HCL 4 MG PO TABS
4.0000 mg | ORAL_TABLET | Freq: Three times a day (TID) | ORAL | Status: DC | PRN
Start: 2014-01-28 — End: 2015-01-21

## 2014-01-28 MED ORDER — SODIUM CHLORIDE 0.9 % IV BOLUS (SEPSIS)
2000.0000 mL | Freq: Once | INTRAVENOUS | Status: AC
  Administered 2014-01-28: 2000 mL via INTRAVENOUS

## 2014-01-28 MED ORDER — CYCLOBENZAPRINE HCL 10 MG PO TABS: 10.0000 mg | ORAL_TABLET | Freq: Three times a day (TID) | ORAL | Status: DC | PRN

## 2014-01-28 MED ORDER — METOCLOPRAMIDE HCL 5 MG/ML IJ SOLN
10.0000 mg | Freq: Once | INTRAMUSCULAR | Status: AC
  Administered 2014-01-28: 10 mg via INTRAVENOUS
  Filled 2014-01-28: qty 2

## 2014-01-28 MED ORDER — DEXAMETHASONE SODIUM PHOSPHATE 4 MG/ML IJ SOLN
10.0000 mg | Freq: Once | INTRAMUSCULAR | Status: AC
  Administered 2014-01-28: 10 mg via INTRAVENOUS
  Filled 2014-01-28: qty 3

## 2014-01-28 MED ORDER — DIAZEPAM 5 MG/ML IJ SOLN
5.0000 mg | Freq: Once | INTRAMUSCULAR | Status: AC
  Administered 2014-01-28: 5 mg via INTRAVENOUS
  Filled 2014-01-28: qty 2

## 2014-01-28 NOTE — Discharge Instructions (Signed)
Take the flexeril for your soreness in your back. Use the zofran for nausea or vomiting. Follow up with your doctor if your pain continues.
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
## 2014-01-28 NOTE — ED Notes (Addendum)
Pt reports right flank pain and RLQ pain for several days. Pt reports "i think i past a kidney stone." pt reports v/d, hematuria. No active vomiting noted in triage.

## 2014-01-28 NOTE — Progress Notes (Signed)
ED/CM noted patient did not have health insurance and/or PCP listed in the computer.  Patient was given the [facility] resources handout with information on the clinics, food pantries, and the handout for new health insurance sign-up.  Also provided drug discount card.  Patient expressed appreciation for information received.

## 2014-01-28 NOTE — ED Notes (Signed)
Pt states "kidney pain" began 8 days ago. Pt now states pain to suprapubic area. Pt states he began vomiting 3 days ago. Pt states he has noticed blood in his urine. NAD

## 2014-05-21 ENCOUNTER — Ambulatory Visit: Payer: Self-pay | Attending: Internal Medicine

## 2014-06-11 ENCOUNTER — Encounter (HOSPITAL_COMMUNITY): Payer: Self-pay | Admitting: Psychiatry

## 2014-06-11 ENCOUNTER — Encounter (INDEPENDENT_AMBULATORY_CARE_PROVIDER_SITE_OTHER): Payer: Self-pay

## 2014-06-11 ENCOUNTER — Ambulatory Visit (INDEPENDENT_AMBULATORY_CARE_PROVIDER_SITE_OTHER): Payer: MEDICAID | Admitting: Psychiatry

## 2014-06-11 VITALS — Ht 71.0 in | Wt 264.0 lb

## 2014-06-11 DIAGNOSIS — F329 Major depressive disorder, single episode, unspecified: Secondary | ICD-10-CM | POA: Diagnosis not present

## 2014-06-11 DIAGNOSIS — F4310 Post-traumatic stress disorder, unspecified: Secondary | ICD-10-CM

## 2014-06-11 DIAGNOSIS — F3113 Bipolar disorder, current episode manic without psychotic features, severe: Secondary | ICD-10-CM

## 2014-06-11 MED ORDER — LAMOTRIGINE 25 MG PO TABS: ORAL_TABLET | ORAL | Status: DC
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
## 2014-06-18 ENCOUNTER — Ambulatory Visit: Payer: Self-pay | Attending: Internal Medicine | Admitting: Internal Medicine

## 2014-06-18 ENCOUNTER — Encounter: Payer: Self-pay | Admitting: Internal Medicine

## 2014-06-18 VITALS — BP 147/94 | HR 93 | Temp 97.4°F | Ht 71.0 in | Wt 254.0 lb

## 2014-06-18 DIAGNOSIS — Y92009 Unspecified place in unspecified non-institutional (private) residence as the place of occurrence of the external cause: Secondary | ICD-10-CM

## 2014-06-18 DIAGNOSIS — W19XXXS Unspecified fall, sequela: Secondary | ICD-10-CM

## 2014-06-18 DIAGNOSIS — W19XXXA Unspecified fall, initial encounter: Secondary | ICD-10-CM

## 2014-06-18 DIAGNOSIS — I10 Essential (primary) hypertension: Secondary | ICD-10-CM

## 2014-06-18 DIAGNOSIS — G894 Chronic pain syndrome: Secondary | ICD-10-CM

## 2014-06-18 DIAGNOSIS — F17210 Nicotine dependence, cigarettes, uncomplicated: Secondary | ICD-10-CM

## 2014-06-18 HISTORY — DX: Unspecified place in unspecified non-institutional (private) residence as the place of occurrence of the external cause: W19.XXXA

## 2014-06-18 HISTORY — DX: Unspecified place in unspecified non-institutional (private) residence as the place of occurrence of the external cause: Y92.009

## 2014-06-18 LAB — COMPLETE METABOLIC PANEL WITH GFR
ALK PHOS: 74 U/L (ref 39–117)
ALT: 65 U/L — ABNORMAL HIGH (ref 0–53)
AST: 40 U/L — AB (ref 0–37)
Albumin: 4.4 g/dL (ref 3.5–5.2)
BILIRUBIN TOTAL: 0.7 mg/dL (ref 0.2–1.2)
BUN: 13 mg/dL (ref 6–23)
CALCIUM: 9.9 mg/dL (ref 8.4–10.5)
CO2: 26 meq/L (ref 19–32)
Chloride: 96 mEq/L (ref 96–112)
Creat: 0.92 mg/dL (ref 0.50–1.35)
GLUCOSE: 88 mg/dL (ref 70–99)
POTASSIUM: 4.5 meq/L (ref 3.5–5.3)
SODIUM: 135 meq/L (ref 135–145)
TOTAL PROTEIN: 7.8 g/dL (ref 6.0–8.3)

## 2014-06-18 MED ORDER — AMITRIPTYLINE HCL 50 MG PO TABS
50.0000 mg | ORAL_TABLET | Freq: Every day | ORAL | Status: DC
Start: 2014-06-18 — End: 2014-12-07

## 2014-06-18 MED ORDER — ACETAMINOPHEN-CODEINE #3 300-30 MG PO TABS: 1.0000 | ORAL_TABLET | ORAL | Status: DC | PRN

## 2014-06-18 NOTE — Progress Notes (Signed)
Patient ID: Oscar Hall, male   DOB: May 28, 1960, 54 y.o.   MRN: [id number]   Knight Oelkers, is a 55 y.o. male  [id number]:[id number]  [id number]  DOB - October 27, 1960  Chief Complaint  Patient presents with  . Migraine  . Leg Swelling  . Foot Swelling        Subjective:   Oscar Hall is a 54 y.o. male here today for a follow up visit. Patient has hypertension, type I bipolar disorder, PTSD, chronic neck pain, chronic back pain, coronary artery disease, COPD and nicotine abuse. He called me today for routine follow-up. He is complaining of migraine headache that has now lasted for about 3 months with a pain scale of 8 out of 10. He has had increasing feet and leg swelling with a pain scale of 10 out of 10. Patient stated that he has been out of pain medication for about 5 months, has been falling over because of imbalance over the past month, at the time he fell and hurt his chest now have a rib pain. Patient claims compliant with his high blood pressure medicine, thinks his elevated blood pressure today may be due to pain. Patient has No headache, No abdominal pain - No Nausea, No new weakness tingling or numbness, No Cough - SOB. He continues to smoke heavily about 1 pack of cigarettes per day.  Problem  Cigarette Nicotine Dependence Without Complication  Fall At Home    ALLERGIES: Allergies  Allergen Reactions  . Penicillins Anaphylaxis and Swelling  . Vistaril [Hydroxyzine Hcl] Hives and Shortness Of Breath  . Sulfa Antibiotics Hives and Itching  . Lithium Other (See Comments)    Increases blood pressure.  . Tramadol Other (See Comments)    G.I. Upset   . Celebrex [Celecoxib] Other (See Comments)    G.I. Upset  . Neurontin [Gabapentin] Nausea And Vomiting  . Soma [Carisoprodol] Other (See Comments)    G.I. Upset   . Toradol [Ketorolac Tromethamine] Other (See Comments)    G.IMariah Milling     PAST MEDICAL HISTORY: Past Medical History  Diagnosis Date  . PTSD (post-traumatic stress  disorder)   . Coronary artery disease   . Hypercholesterolemia   . Fatty liver   . Hypercholesteremia   . Hypertension   . COPD (chronic obstructive pulmonary disease)     MEDICATIONS AT HOME: Prior to Admission medications   Medication Sig Start Date End Date Taking? Authorizing Provider  albuterol (PROVENTIL HFA;VENTOLIN HFA) 108 (90 BASE) MCG/ACT inhaler Inhale 2 puffs into the lungs every 6 (six) hours as needed for wheezing or shortness of breath. 11/30/13  Yes Tresa Garter, MD  amitriptyline (ELAVIL) 50 MG tablet Take 1 tablet (50 mg total) by mouth at bedtime. 06/18/14  Yes Tresa Garter, MD  cyclobenzaprine (FLEXERIL) 10 MG tablet Take 1 tablet (10 mg total) by mouth 3 (three) times daily as needed for muscle spasms. 01/28/14  Yes Rolland Porter, MD  ezetimibe-simvastatin (VYTORIN) 10-10 MG per tablet Take 1 tablet by mouth at bedtime. 12/12/13  Yes Tresa Garter, MD  lamoTRIgine (LAMICTAL) 25 MG tablet Take one tablet for 5 days then 2 tablets for the next 7 days. Then start taking 3 tablets 06/11/14  Yes Merian Capron, MD  lisinopril-hydrochlorothiazide (PRINZIDE,ZESTORETIC) 10-12.5 MG per tablet Take 1 tablet by mouth daily.   Yes Historical Provider, MD  ondansetron (ZOFRAN) 4 MG tablet Take 1 tablet (4 mg total) by mouth every 8 (eight) hours as needed. 01/28/14  Yes Iva  Tomi Bamberger, MD  acetaminophen-codeine (TYLENOL #3) 300-30 MG per tablet Take 1 tablet by mouth every 4 (four) hours as needed. 06/18/14   Tresa Garter, MD     Objective:   Filed Vitals:   06/18/14 1148  BP: 147/94  Pulse: 93  Temp: 97.4 F (36.3 C)  TempSrc: Oral  Height: 5\' 11"  (1.803 m)  Weight: 254 lb (115.214 kg)  SpO2: 99%    Exam General appearance : Awake, alert, not in any distress. Speech Clear. Not toxic looking HEENT: Atraumatic and Normocephalic, pupils equally reactive to light and accomodation Neck: supple, no JVD. No cervical lymphadenopathy.  Chest:Good air entry  bilaterally, no added sounds  CVS: S1 S2 regular, no murmurs.  Abdomen: Bowel sounds present, Non tender and not distended with no gaurding, rigidity or rebound. Extremities: B/L Lower Ext shows no edema, both legs are warm to touch Neurology: Awake alert, and oriented X 3, CN II-XII intact, Non focal Skin:No Rash  Data Review Lab Results  Component Value Date   HGBA1C 5.7* 02/13/2013     Assessment & Plan   1. Chronic pain syndrome  - acetaminophen-codeine (TYLENOL #3) 300-30 MG per tablet; Take 1 tablet by mouth every 4 (four) hours as needed.  Dispense: 60 tablet; Refill: 0  - Increase amitriptyline (ELAVIL) 50 MG tablet; Take 1 tablet (50 mg total) by mouth at bedtime.  Dispense: 30 tablet; Refill: 3  2. Essential hypertension  We have discussed target BP range and blood pressure goal. I have advised patient to check BP regularly and to call us back or report to clinic if the numbers are consistently higher than 140/90. We discussed the importance of compliance with medical therapy and DASH diet recommended, consequences of uncontrolled hypertension discussed.  - continue current BP medications  3. Cigarette nicotine dependence without complication  Recardo was counseled on the dangers of tobacco use, and was advised to quit. Reviewed strategies to maximize success, including removing cigarettes and smoking materials from environment, stress management and support of family/friends.  4. Fall at home, sequela  - MR Brain Wo Contrast; Future - Vit D  25 hydroxy (rtn osteoporosis monitoring) - COMPLETE METABOLIC PANEL WITH GFR   Will consider PT/OT if MRI is normal with normal Vit D level, otherwise, will consider neurologist referal  Patient have been counseled extensively about nutrition and exercise  Return in about 6 months (around 12/19/2014), or if symptoms worsen or fail to improve, for Follow up HTN, Follow up Pain and comorbidities.  The patient was given clear  instructions to go to ER or return to medical center if symptoms don't improve, worsen or new problems develop. The patient verbalized understanding. The patient was told to call to get lab results if they haven't heard anything in the next week.   This note has been created with Surveyor, quantity. Any transcriptional errors are unintentional.    Angelica Chessman, MD, [place], [place], [place], [place] and [place], [place]   06/18/2014, 12:28 PM

## 2014-06-18 NOTE — Patient Instructions (Signed)

## 2014-06-18 NOTE — Progress Notes (Signed)
patient presents for a follow up visit. He has been complaining of a Migrane that now has lasted x 3 months with a pain scale of 8/10. He has had increasing feet and leg swelling with a pain scale of 10/10. Patient states that he has been otu of any pain medication for 5 months now. He has had 8 falls over and balance loss over the last month with some rib pain after.

## 2014-06-19 LAB — VITAMIN D 25 HYDROXY (VIT D DEFICIENCY, FRACTURES): Vit D, 25-Hydroxy: 15 ng/mL — ABNORMAL LOW (ref 30–100)

## 2014-06-26 ENCOUNTER — Telehealth: Payer: Self-pay

## 2014-06-26 MED ORDER — VITAMIN D (ERGOCALCIFEROL) 1.25 MG (50000 UNIT) PO CAPS: 50000.0000 [IU] | ORAL_CAPSULE | ORAL | Status: DC

## 2014-06-26 NOTE — Telephone Encounter (Signed)
-----   Message from Tresa Garter, MD sent at 06/22/2014  5:43 PM EDT ----- Please inform patient that his vitamin D level is low, and there is slight elevation of liver enzymes. We'll need to replace vitamin D, and we need to repeat liver function and do further workup. Please schedule patient for office visit in 2-4 weeks for follow-up elevated liver enzymes.  Please call in prescription ergocalciferol 50,000 units 1 capsule weekly for 12 weeks.

## 2014-06-26 NOTE — Telephone Encounter (Signed)
Patient not available Left message on voice mail to return our call 

## 2014-06-26 NOTE — Telephone Encounter (Signed)
Pt calling to return nurse's call, please f/u with pt.

## 2014-06-27 ENCOUNTER — Telehealth: Payer: Self-pay | Admitting: Internal Medicine

## 2014-06-27 NOTE — Telephone Encounter (Signed)
Patients sister is Insurance risk surveyor, please f/u

## 2014-07-05 NOTE — Telephone Encounter (Signed)
Patient is calling to speak to nurse, please f/u with pt.

## 2014-07-13 ENCOUNTER — Telehealth: Payer: Self-pay

## 2014-07-13 NOTE — Telephone Encounter (Signed)
Patient  Returned phone call and is aware of his lab results

## 2014-07-16 ENCOUNTER — Ambulatory Visit: Payer: Self-pay | Attending: Internal Medicine

## 2014-07-16 DIAGNOSIS — R748 Abnormal levels of other serum enzymes: Secondary | ICD-10-CM

## 2014-07-17 LAB — CMP AND LIVER
ALT: 38 U/L (ref 0–53)
AST: 28 U/L (ref 0–37)
Albumin: 4.2 g/dL (ref 3.5–5.2)
Alkaline Phosphatase: 75 U/L (ref 39–117)
BILIRUBIN TOTAL: 0.5 mg/dL (ref 0.2–1.2)
BUN: 12 mg/dL (ref 6–23)
Bilirubin, Direct: 0.1 mg/dL (ref 0.0–0.3)
CALCIUM: 10.1 mg/dL (ref 8.4–10.5)
CHLORIDE: 100 meq/L (ref 96–112)
CO2: 27 meq/L (ref 19–32)
CREATININE: 1.05 mg/dL (ref 0.50–1.35)
Glucose, Bld: 99 mg/dL (ref 70–99)
Indirect Bilirubin: 0.4 mg/dL (ref 0.2–1.2)
Potassium: 5.2 mEq/L (ref 3.5–5.3)
Sodium: 138 mEq/L (ref 135–145)
Total Protein: 7.7 g/dL (ref 6.0–8.3)

## 2014-07-20 ENCOUNTER — Emergency Department (HOSPITAL_COMMUNITY)
Admission: EM | Admit: 2014-07-20 | Discharge: 2014-07-20 | Disposition: A | Discharge status: Home / Self Care | Payer: Self-pay | Attending: Emergency Medicine | Admitting: Emergency Medicine

## 2014-07-20 ENCOUNTER — Emergency Department (HOSPITAL_COMMUNITY): Payer: Self-pay

## 2014-07-20 ENCOUNTER — Encounter (HOSPITAL_COMMUNITY): Payer: Self-pay | Admitting: *Deleted

## 2014-07-20 DIAGNOSIS — J449 Chronic obstructive pulmonary disease, unspecified: Secondary | ICD-10-CM | POA: Insufficient documentation

## 2014-07-20 DIAGNOSIS — I2510 Atherosclerotic heart disease of native coronary artery without angina pectoris: Secondary | ICD-10-CM | POA: Insufficient documentation

## 2014-07-20 DIAGNOSIS — R1110 Vomiting, unspecified: Secondary | ICD-10-CM

## 2014-07-20 DIAGNOSIS — F4310 Post-traumatic stress disorder, unspecified: Secondary | ICD-10-CM | POA: Insufficient documentation

## 2014-07-20 DIAGNOSIS — Z8719 Personal history of other diseases of the digestive system: Secondary | ICD-10-CM | POA: Insufficient documentation

## 2014-07-20 DIAGNOSIS — Z880 Allergy status to penicillin: Secondary | ICD-10-CM | POA: Insufficient documentation

## 2014-07-20 DIAGNOSIS — R079 Chest pain, unspecified: Secondary | ICD-10-CM | POA: Insufficient documentation

## 2014-07-20 DIAGNOSIS — Z720 Tobacco use: Secondary | ICD-10-CM | POA: Insufficient documentation

## 2014-07-20 DIAGNOSIS — I10 Essential (primary) hypertension: Secondary | ICD-10-CM | POA: Insufficient documentation

## 2014-07-20 DIAGNOSIS — Z8639 Personal history of other endocrine, nutritional and metabolic disease: Secondary | ICD-10-CM | POA: Insufficient documentation

## 2014-07-20 DIAGNOSIS — R112 Nausea with vomiting, unspecified: Secondary | ICD-10-CM | POA: Insufficient documentation

## 2014-07-20 LAB — CBC WITH DIFFERENTIAL/PLATELET
BASOS PCT: 0 % (ref 0–1)
Basophils Absolute: 0 10*3/uL (ref 0.0–0.1)
EOS ABS: 0.1 10*3/uL (ref 0.0–0.7)
Eosinophils Relative: 1 % (ref 0–5)
HCT: 49.6 % (ref 39.0–52.0)
Hemoglobin: 18.1 g/dL — ABNORMAL HIGH (ref 13.0–17.0)
Lymphocytes Relative: 16 % (ref 12–46)
Lymphs Abs: 2.4 10*3/uL (ref 0.7–4.0)
MCH: 34.2 pg — AB (ref 26.0–34.0)
MCHC: 36.5 g/dL — AB (ref 30.0–36.0)
MCV: 93.6 fL (ref 78.0–100.0)
MONOS PCT: 4 % (ref 3–12)
Monocytes Absolute: 0.7 10*3/uL (ref 0.1–1.0)
NEUTROS ABS: 11.5 10*3/uL — AB (ref 1.7–7.7)
Neutrophils Relative %: 79 % — ABNORMAL HIGH (ref 43–77)
Platelets: 280 10*3/uL (ref 150–400)
RBC: 5.3 MIL/uL (ref 4.22–5.81)
RDW: 12.6 % (ref 11.5–15.5)
WBC: 14.7 10*3/uL — ABNORMAL HIGH (ref 4.0–10.5)

## 2014-07-20 LAB — LIPASE, BLOOD: Lipase: 30 U/L (ref 11–59)

## 2014-07-20 LAB — COMPREHENSIVE METABOLIC PANEL
ALBUMIN: 4.8 g/dL (ref 3.5–5.2)
ALK PHOS: 83 U/L (ref 39–117)
ALT: 53 U/L (ref 0–53)
AST: 40 U/L — ABNORMAL HIGH (ref 0–37)
Anion gap: 13 (ref 5–15)
BILIRUBIN TOTAL: 0.9 mg/dL (ref 0.3–1.2)
BUN: 18 mg/dL (ref 6–23)
CALCIUM: 9.8 mg/dL (ref 8.4–10.5)
CHLORIDE: 98 mmol/L (ref 96–112)
CO2: 21 mmol/L (ref 19–32)
CREATININE: 1.11 mg/dL (ref 0.50–1.35)
GFR, EST AFRICAN AMERICAN: 87 mL/min — AB (ref >90)
GFR, EST NON AFRICAN AMERICAN: 75 mL/min — AB (ref >90)
GLUCOSE: 117 mg/dL — AB (ref 70–99)
POTASSIUM: 3.7 mmol/L (ref 3.5–5.1)
SODIUM: 132 mmol/L — AB (ref 135–145)
TOTAL PROTEIN: 8.5 g/dL — AB (ref 6.0–8.3)

## 2014-07-20 LAB — TROPONIN I: Troponin I: <0.03 ng/mL (ref <0.031)

## 2014-07-20 LAB — I-STAT TROPONIN, ED: Troponin i, poc: 0 ng/mL (ref 0.00–0.08)

## 2014-07-20 MED ORDER — SODIUM CHLORIDE 0.9 % IV SOLN
INTRAVENOUS | Status: DC
  Administered 2014-07-20: 14:00:00 via INTRAVENOUS

## 2014-07-20 MED ORDER — ONDANSETRON HCL 4 MG/2ML IJ SOLN
4.0000 mg | Freq: Once | INTRAMUSCULAR | Status: AC
  Administered 2014-07-20: 4 mg via INTRAVENOUS
  Filled 2014-07-20: qty 2

## 2014-07-20 MED ORDER — ONDANSETRON 4 MG PO TBDP: 4.0000 mg | ORAL_TABLET | Freq: Three times a day (TID) | ORAL | Status: DC | PRN

## 2014-07-20 MED ORDER — FENTANYL CITRATE (PF) 100 MCG/2ML IJ SOLN
50.0000 ug | Freq: Once | INTRAMUSCULAR | Status: AC
  Administered 2014-07-20: 50 ug via INTRAVENOUS
  Filled 2014-07-20: qty 2

## 2014-07-20 MED ORDER — HYDROCODONE-ACETAMINOPHEN 5-325 MG PO TABS: 1.0000 | ORAL_TABLET | Freq: Four times a day (QID) | ORAL | Status: DC | PRN

## 2014-07-20 MED ORDER — SODIUM CHLORIDE 0.9 % IV BOLUS (SEPSIS)
1000.0000 mL | Freq: Once | INTRAVENOUS | Status: AC
  Administered 2014-07-20: 1000 mL via INTRAVENOUS

## 2014-07-20 MED ORDER — IOHEXOL 350 MG/ML SOLN
100.0000 mL | Freq: Once | INTRAVENOUS | Status: AC | PRN
  Administered 2014-07-20: 100 mL via INTRAVENOUS

## 2014-07-20 NOTE — ED Notes (Signed)
Pt resting at this time in NAD.

## 2014-07-20 NOTE — ED Notes (Signed)
Pt states has been vomiting since 0300. Co abdominal pain.

## 2014-07-20 NOTE — ED Provider Notes (Signed)
CSN: [id number]     Arrival date & time 07/20/14  1217 History   First MD Initiated Contact with Patient 07/20/14 1309     Chief Complaint  Patient presents with  . Emesis     (Consider location/radiation/quality/duration/timing/severity/associated sxs/prior Treatment) Patient is a 54 y.o. male presenting with vomiting. The history is provided by the patient.  Emesis Associated symptoms: no abdominal pain, no diarrhea and no headaches    patient with acute onset of pain to both lateral ribs. Started at 3 in the morning associated with nausea and vomiting but no diarrhea no belly pain. Pain started on the left and then moved to the right now it's at both places. Patient has vomited multiple times proximally 10 times. No back pain no fever no headache. The rib pain is 10 out of 10. Room air sats are 95-99%.  Past Medical History  Diagnosis Date  . PTSD (post-traumatic stress disorder)   . Coronary artery disease   . Hypercholesterolemia   . Fatty liver   . Hypercholesteremia   . Hypertension   . COPD (chronic obstructive pulmonary disease)    Past Surgical History  Procedure Laterality Date  . Throat surgery      traumatic injury  . Hand surgery    . Knee surgery    . Shoulder surgery     Family History  Problem Relation Age of Onset  . Alcohol abuse Father   . Anxiety disorder Father   . Depression Father   . Coronary artery disease Father   . Alcohol abuse Brother   . Coronary artery disease Brother   . Schizophrenia Neg Hx   . Diabetes Mellitus II Neg Hx   . Drug abuse Brother   . Depression Brother 42    Commited suicide   . Cancer Mother 55    ovarian   History  Substance Use Topics  . Smoking status: Current Every Day Smoker -- 0.75 packs/day for 30 years    Types: Cigarettes    Start date: 03/24/1983  . Smokeless tobacco: Not on file     Comment: Quit for 18 months 4 years ago  . Alcohol Use: No     Comment: Quit drinking. 2-3 fifths, binge drinking.     Review of Systems  Constitutional: Negative for fever.  HENT: Negative for congestion.   Eyes: Negative for visual disturbance.  Respiratory: Negative for shortness of breath.   Cardiovascular: Positive for chest pain.  Gastrointestinal: Positive for nausea and vomiting. Negative for abdominal pain and diarrhea.  Genitourinary: Negative for dysuria.  Musculoskeletal: Negative for back pain.  Skin: Negative for rash.  Neurological: Negative for headaches.  Hematological: Negative for adenopathy.  Psychiatric/Behavioral: Negative for confusion.      Allergies  Penicillins; Vistaril; Sulfa antibiotics; Lithium; Tramadol; Celebrex; Neurontin; Soma; and Toradol  Home Medications   Prior to Admission medications   Medication Sig Start Date End Date Taking? Authorizing Provider  albuterol (PROVENTIL HFA;VENTOLIN HFA) 108 (90 BASE) MCG/ACT inhaler Inhale 2 puffs into the lungs every 6 (six) hours as needed for wheezing or shortness of breath. 11/30/13  Yes Tresa Garter, MD  amitriptyline (ELAVIL) 50 MG tablet Take 1 tablet (50 mg total) by mouth at bedtime. 06/18/14  Yes Tresa Garter, MD  ezetimibe-simvastatin (VYTORIN) 10-10 MG per tablet Take 1 tablet by mouth at bedtime. 12/12/13  Yes Tresa Garter, MD  lamoTRIgine (LAMICTAL) 25 MG tablet Take one tablet for 5 days then 2 tablets for the  next 7 days. Then start taking 3 tablets Patient taking differently: Take 75 mg by mouth daily. Take one tablet for 5 days then 2 tablets for the next 7 days. Then start taking 3 tablets 06/11/14  Yes Merian Capron, MD  lisinopril-hydrochlorothiazide (PRINZIDE,ZESTORETIC) 10-12.5 MG per tablet Take 1 tablet by mouth daily.   Yes Historical Provider, MD  Vitamin D, Ergocalciferol, (DRISDOL) 50000 UNITS CAPS capsule Take 1 capsule (50,000 Units total) by mouth every 7 (seven) days. 06/26/14  Yes Tresa Garter, MD  acetaminophen-codeine (TYLENOL #3) 300-30 MG per tablet Take 1 tablet  by mouth every 4 (four) hours as needed. Patient not taking: Reported on 07/20/2014 06/18/14   Tresa Garter, MD  cyclobenzaprine (FLEXERIL) 10 MG tablet Take 1 tablet (10 mg total) by mouth 3 (three) times daily as needed for muscle spasms. Patient not taking: Reported on 07/20/2014 01/28/14   Rolland Porter, MD  HYDROcodone-acetaminophen (NORCO/VICODIN) 5-325 MG per tablet Take 1-2 tablets by mouth every 6 (six) hours as needed. 07/20/14   Fredia Sorrow, MD  ondansetron (ZOFRAN ODT) 4 MG disintegrating tablet Take 1 tablet (4 mg total) by mouth every 8 (eight) hours as needed. 07/20/14   Fredia Sorrow, MD  ondansetron (ZOFRAN) 4 MG tablet Take 1 tablet (4 mg total) by mouth every 8 (eight) hours as needed. Patient not taking: Reported on 07/20/2014 01/28/14   Rolland Porter, MD   BP 130/86 mmHg  Pulse 92  Temp(Src) 98.8 F (37.1 C) (Oral)  Resp 14  Ht 5\' 11"  (1.803 m)  Wt 270 lb (122.471 kg)  BMI 37.67 kg/m2  SpO2 95% Physical Exam  Constitutional: He is oriented to person, place, and time. He appears well-developed and well-nourished. No distress.  HENT:  Head: Normocephalic and atraumatic.  Mouth/Throat: Oropharynx is clear and moist.  Eyes: Conjunctivae and EOM are normal. Pupils are equal, round, and reactive to light.  Neck: Normal range of motion.  Cardiovascular: Normal rate, regular rhythm and normal heart sounds.   No murmur heard. Pulmonary/Chest: Effort normal and breath sounds normal.  Abdominal: Bowel sounds are normal. There is no tenderness.  Musculoskeletal: Normal range of motion.  Neurological: He is alert and oriented to person, place, and time. No cranial nerve deficit. He exhibits normal muscle tone. Coordination normal.  Skin: Skin is warm. No rash noted.  Nursing note and vitals reviewed.   ED Course  Procedures (including critical care time) Labs Review Labs Reviewed  COMPREHENSIVE METABOLIC PANEL - Abnormal; Notable for the following:    Sodium 132 (*)     Glucose, Bld 117 (*)    Total Protein 8.5 (*)    AST 40 (*)    GFR calc non Af Amer 75 (*)    GFR calc Af Amer 87 (*)    All other components within normal limits  CBC WITH DIFFERENTIAL/PLATELET - Abnormal; Notable for the following:    WBC 14.7 (*)    Hemoglobin 18.1 (*)    MCH 34.2 (*)    MCHC 36.5 (*)    Neutrophils Relative % 79 (*)    Neutro Abs 11.5 (*)    All other components within normal limits  LIPASE, BLOOD  TROPONIN I  I-STAT TROPOININ, ED   Results for orders placed or performed during the hospital encounter of 07/20/14  Comprehensive metabolic panel  Result Value Ref Range   Sodium 132 (L) 135 - 145 mmol/L   Potassium 3.7 3.5 - 5.1 mmol/L   Chloride 98 96 -  112 mmol/L   CO2 21 19 - 32 mmol/L   Glucose, Bld 117 (H) 70 - 99 mg/dL   BUN 18 6 - 23 mg/dL   Creatinine, Ser 1.11 0.50 - 1.35 mg/dL   Calcium 9.8 8.4 - 10.5 mg/dL   Total Protein 8.5 (H) 6.0 - 8.3 g/dL   Albumin 4.8 3.5 - 5.2 g/dL   AST 40 (H) 0 - 37 U/L   ALT 53 0 - 53 U/L   Alkaline Phosphatase 83 39 - 117 U/L   Total Bilirubin 0.9 0.3 - 1.2 mg/dL   GFR calc non Af Amer 75 (L) >90 mL/min   GFR calc Af Amer 87 (L) >90 mL/min   Anion gap 13 5 - 15  Lipase, blood  Result Value Ref Range   Lipase 30 11 - 59 U/L  CBC with Differential/Platelet  Result Value Ref Range   WBC 14.7 (H) 4.0 - 10.5 K/uL   RBC 5.30 4.22 - 5.81 MIL/uL   Hemoglobin 18.1 (H) 13.0 - 17.0 g/dL   HCT 49.6 39.0 - 52.0 %   MCV 93.6 78.0 - 100.0 fL   MCH 34.2 (H) 26.0 - 34.0 pg   MCHC 36.5 (H) 30.0 - 36.0 g/dL   RDW 12.6 11.5 - 15.5 %   Platelets 280 150 - 400 K/uL   Neutrophils Relative % 79 (H) 43 - 77 %   Neutro Abs 11.5 (H) 1.7 - 7.7 K/uL   Lymphocytes Relative 16 12 - 46 %   Lymphs Abs 2.4 0.7 - 4.0 K/uL   Monocytes Relative 4 3 - 12 %   Monocytes Absolute 0.7 0.1 - 1.0 K/uL   Eosinophils Relative 1 0 - 5 %   Eosinophils Absolute 0.1 0.0 - 0.7 K/uL   Basophils Relative 0 0 - 1 %   Basophils Absolute 0.0 0.0 - 0.1  K/uL  Troponin I  Result Value Ref Range   Troponin I <0.03 <0.031 ng/mL  I-Stat Troponin, ED (not at [facility])  Result Value Ref Range   Troponin i, poc 0.00 0.00 - 0.08 ng/mL   Comment 3             Imaging Review Ct Angio Chest Pe W/cm &/or Wo Cm  07/20/2014   CLINICAL DATA:  Chest pain  EXAM: CT ANGIOGRAPHY CHEST WITH CONTRAST  TECHNIQUE: Multidetector CT imaging of the chest was performed using the standard protocol during bolus administration of intravenous contrast. Multiplanar CT image reconstructions and MIPs were obtained to evaluate the vascular anatomy.  CONTRAST:  140mL OMNIPAQUE IOHEXOL 350 MG/ML SOLN  COMPARISON:  Chest radiograph July 20, 2014  FINDINGS: There is no demonstrable pulmonary embolus. There is no thoracic aortic aneurysm or dissection.  On axial slice 33 series 5, there is a 5 mm nodular opacity in the anterior segment of the right upper lobe. On axial slice 46 series 5, there is a 3 mm nodular opacity in the superior segment right lower lobe. There is no lung edema or consolidation.  There is no appreciable thoracic adenopathy. The pericardium is not thickened. There are scattered foci of coronary artery calcification. Visualized thyroid appears normal. There is a small hiatal hernia.  In the visualized upper abdomen, there is mild hepatic steatosis. There are no blastic or lytic bone lesions.  Review of the MIP images confirms the above findings.  IMPRESSION: No demonstrable pulmonary embolus. No edema or consolidation. There are nodular opacities on the right, largest measuring 5 mm. If the patient  is at high risk for bronchogenic carcinoma, follow-up chest CT at 6-12 months is recommended. If the patient is at low risk for bronchogenic carcinoma, follow-up chest CT at 12 months is recommended. This recommendation follows the consensus statement: Guidelines for Management of Small Pulmonary Nodules Detected on CT Scans: A Statement from the [place] as published in  Radiology 2005;237:395-400. No demonstrable adenopathy.  Scattered foci of coronary artery calcification. Mild hepatic steatosis. Small hiatal hernia.   Electronically Signed   By: Lowella Grip III M.D.   On: 07/20/2014 16:35   Dg Chest Port 1 View  07/20/2014   CLINICAL DATA:  Bilateral lower chest pain and shortness of breath. Nausea. COPD. Coronary artery disease.  EXAM: PORTABLE CHEST - 1 VIEW  COMPARISON:  09/14/2013  FINDINGS: The heart size and mediastinal contours are within normal limits. Both lungs are clear. The visualized skeletal structures are unremarkable.  IMPRESSION: Stable exam.  No active disease.   Electronically Signed   By: Earle Gell M.D.   On: 07/20/2014 13:51     EKG Interpretation   Date/Time:  Friday July 20 2014 12:33:29 EDT Ventricular Rate:  90 PR Interval:  140 QRS Duration: 94 QT Interval:  379 QTC Calculation: 464 R Axis:   37 Text Interpretation:  Sinus rhythm Borderline T wave abnormalities  Baseline wander in lead(s) V2 Confirmed by Dyneshia Baccam  MD, Amanpreet Delmont ([id number]) on  07/20/2014 2:06:45 PM      MDM   Final diagnoses:  Chest pain  Intractable vomiting with nausea, vomiting of unspecified type    Patient never had any abdominal pain. Did have nausea and vomiting and bilateral rib pain started on the left and went to the right. Workup for any of cardiac event without any acute findings plus pain was really not cardiac in nature. Patient's troponin was negative EKG without acute changes. CT angios also negative for pulmonary embolus. Patient improved with pain medicine and antinausea medicine. Again no abdominal tenderness at all. Patient does have a known large umbilical hernia is present for years. Patient we discharged home with antinausea medicine. Follow-up with his doctor.    Fredia Sorrow, MD 07/20/14 1758

## 2014-07-20 NOTE — ED Notes (Signed)
Pt states that he has been vomiting all night and is having pain and muscle spasms in ribs bilaterally.  Pt hyperventilating and very anxious.  States that he cannot sit back on the stretcher due to the "catches" in his ribs.  Placed on cardiac monitor which pt promptly removed.

## 2014-07-20 NOTE — Discharge Instructions (Signed)
Take the medications as directed. Follow-up with your doctor. Today's workup including CT angiogram of the chest without any significant abnormalities. Return for any persistent vomiting which will lead to dehydration.

## 2014-07-20 NOTE — ED Notes (Signed)
Pt states that his pain is better at this time.  Resting quietly with wife at bedside.

## 2014-07-26 ENCOUNTER — Telehealth: Payer: Self-pay

## 2014-07-26 NOTE — Telephone Encounter (Signed)
Patient not available left message on voice mail to return our call

## 2014-07-26 NOTE — Telephone Encounter (Signed)
-----   Message from Tresa Garter, MD sent at 07/25/2014  6:38 PM EDT ----- Please inform patient that his laboratory test results is normal. Liver enzymes level are back to normal.

## 2014-07-31 ENCOUNTER — Ambulatory Visit (INDEPENDENT_AMBULATORY_CARE_PROVIDER_SITE_OTHER): Payer: MEDICAID | Admitting: Psychiatry

## 2014-07-31 ENCOUNTER — Encounter (HOSPITAL_COMMUNITY): Payer: Self-pay | Admitting: Psychiatry

## 2014-07-31 VITALS — BP 138/78 | HR 94 | Ht 71.0 in | Wt 270.0 lb

## 2014-07-31 DIAGNOSIS — F4310 Post-traumatic stress disorder, unspecified: Secondary | ICD-10-CM

## 2014-07-31 DIAGNOSIS — F1021 Alcohol dependence, in remission: Secondary | ICD-10-CM

## 2014-07-31 DIAGNOSIS — F3113 Bipolar disorder, current episode manic without psychotic features, severe: Secondary | ICD-10-CM | POA: Diagnosis not present

## 2014-07-31 DIAGNOSIS — M25512 Pain in left shoulder: Secondary | ICD-10-CM | POA: Diagnosis not present

## 2014-07-31 DIAGNOSIS — F1099 Alcohol use, unspecified with unspecified alcohol-induced disorder: Secondary | ICD-10-CM

## 2014-07-31 DIAGNOSIS — G8929 Other chronic pain: Secondary | ICD-10-CM

## 2014-07-31 MED ORDER — BUSPIRONE HCL 7.5 MG PO TABS: 7.5000 mg | ORAL_TABLET | Freq: Two times a day (BID) | ORAL | Status: DC

## 2014-07-31 MED ORDER — LAMOTRIGINE 25 MG PO TABS: ORAL_TABLET | ORAL | Status: DC

## 2014-07-31 NOTE — Progress Notes (Signed)
Patient ID: Oscar Hall, male   DOB: 09-Dec-1960, 54 y.o.   MRN: [id number] [facility] Follow-up Outpatient Visit  Oscar Hall [id number] 54 y.o.  07/31/2014  Chief Complaint: depression and follow up. Rash possible due to lamictal     History of Present Illness:    Oscar Hall is a 54 y/o male with a past psychiatric history significant for Bipolar I Disorder, most recent episode manic, severe, without psychoatic features, rule out Post traumatic stress disorder. Alcohol use disorder in sustained remission. Chronic pain. The patient is referred for psychiatric services for medication management.  Patient was started back on Lamictal and when he reached 75 mg it did help his mood symptoms but he started having a rash. He has continued to take Lamictal at 75 mg but there is rash marks around the site of the chins.  Mood: upset at times but no psychosis.  Medical complexity/ Data; rash visible. Also mood  exacerbated by his shoulder and joint pains  No relapse on alcohol for more then 3 years.  Rash prominent or red marks around chin. \ . Quality:  [place]. Sister lives next to him but sick so he has to take care of her.  Financial stressors are high . Excessive at times and muscle tightness with decreased sleep. " can i get a benzo or something for nerves"  Has Applied for disability  . Severity:  Depression: 6/10 (0=Very depressed; 5=Neutral; 10=Very Happy)  Anxiety- 6/10 (0=no anxiety; 5= moderate/tolerable anxiety; 10= panic attacks)  . Duration: Since the age of 34  . Timing: Worse midday 10 AM to 4 Pm  . Context: Being in front of people.  . Modifying factors: Dog and medictions Group therapy.  . Associated signs and symptoms:   Past Medical History  Diagnosis Date  . PTSD (post-traumatic stress disorder)   . Coronary artery disease   . Hypercholesterolemia   . Fatty liver   . Hypercholesteremia   . Hypertension   . COPD (chronic  obstructive pulmonary disease)    Family History  Problem Relation Age of Onset  . Alcohol abuse Father   . Anxiety disorder Father   . Depression Father   . Coronary artery disease Father   . Alcohol abuse Brother   . Coronary artery disease Brother   . Schizophrenia Neg Hx   . Diabetes Mellitus II Neg Hx   . Drug abuse Brother   . Depression Brother 80    Commited suicide   . Cancer Mother 33    ovarian    Outpatient Encounter Prescriptions as of 07/31/2014  Medication Sig  . acetaminophen-codeine (TYLENOL #3) 300-30 MG per tablet Take 1 tablet by mouth every 4 (four) hours as needed. (Patient not taking: Reported on 07/20/2014)  . albuterol (PROVENTIL HFA;VENTOLIN HFA) 108 (90 BASE) MCG/ACT inhaler Inhale 2 puffs into the lungs every 6 (six) hours as needed for wheezing or shortness of breath.  Marland Kitchen amitriptyline (ELAVIL) 50 MG tablet Take 1 tablet (50 mg total) by mouth at bedtime.  . busPIRone (BUSPAR) 7.5 MG tablet Take 1 tablet (7.5 mg total) by mouth 2 (two) times daily.  . cyclobenzaprine (FLEXERIL) 10 MG tablet Take 1 tablet (10 mg total) by mouth 3 (three) times daily as needed for muscle spasms. (Patient not taking: Reported on 07/20/2014)  . ezetimibe-simvastatin (VYTORIN) 10-10 MG per tablet Take 1 tablet by mouth at bedtime.  Marland Kitchen HYDROcodone-acetaminophen (NORCO/VICODIN) 5-325 MG per tablet Take 1-2 tablets by  mouth every 6 (six) hours as needed.  . lamoTRIgine (LAMICTAL) 25 MG tablet Take one tablet a day for 5 days and then 2 tablets a day. Do not go higher then 41m  . lisinopril-hydrochlorothiazide (PRINZIDE,ZESTORETIC) 10-12.5 MG per tablet Take 1 tablet by mouth daily.  . ondansetron (ZOFRAN ODT) 4 MG disintegrating tablet Take 1 tablet (4 mg total) by mouth every 8 (eight) hours as needed.  . ondansetron (ZOFRAN) 4 MG tablet Take 1 tablet (4 mg total) by mouth every 8 (eight) hours as needed. (Patient not taking: Reported on 07/20/2014)  . Vitamin D, Ergocalciferol,  (DRISDOL) 50000 UNITS CAPS capsule Take 1 capsule (50,000 Units total) by mouth every 7 (seven) days.  . [DISCONTINUED] lamoTRIgine (LAMICTAL) 25 MG tablet Take one tablet for 5 days then 2 tablets for the next 7 days. Then start taking 3 tablets (Patient taking differently: Take 75 mg by mouth daily. Take one tablet for 5 days then 2 tablets for the next 7 days. Then start taking 3 tablets)   No facility-administered encounter medications on file as of 07/31/2014.    Recent Results (from the past 2160 hour(s))  Vit D  25 hydroxy (rtn osteoporosis monitoring)     Status: Abnormal   Collection Time: 06/18/14 12:27 PM  Result Value Ref Range   Vit D, 25-Hydroxy 15 (L) 30 - 100 ng/mL    Comment: Vitamin D Status           25-OH Vitamin D        Deficiency                <20 ng/mL        Insufficiency         20 - 29 ng/mL        Optimal             > or = 30 ng/mL   For 25-OH Vitamin D testing on patients on D2-supplementation and patients for whom quantitation of D2 and D3 fractions is required, the QuestAssureD 25-OH VIT D, (D2,D3), LC/MS/MS is recommended: order code [phone](patients > 2 yrs).   COMPLETE METABOLIC PANEL WITH GFR     Status: Abnormal   Collection Time: 06/18/14 12:27 PM  Result Value Ref Range   Sodium 135 135 - 145 mEq/L   Potassium 4.5 3.5 - 5.3 mEq/L   Chloride 96 96 - 112 mEq/L   CO2 26 19 - 32 mEq/L   Glucose, Bld 88 70 - 99 mg/dL   BUN 13 6 - 23 mg/dL   Creat 0.92 0.50 - 1.35 mg/dL   Total Bilirubin 0.7 0.2 - 1.2 mg/dL   Alkaline Phosphatase 74 39 - 117 U/L   AST 40 (H) 0 - 37 U/L   ALT 65 (H) 0 - 53 U/L   Total Protein 7.8 6.0 - 8.3 g/dL   Albumin 4.4 3.5 - 5.2 g/dL   Calcium 9.9 8.4 - 10.5 mg/dL   GFR, Est African American >89 mL/min   GFR, Est Non African American >89 mL/min    Comment:   The estimated GFR is a calculation valid for adults (>=119years old) that uses the CKD-EPI algorithm to adjust for age and sex. It is   not to be used for children,  pregnant women, hospitalized patients,    patients on dialysis, or with rapidly changing kidney function. According to the NKDEP, eGFR >89 is normal, 60-89 shows mild impairment, 30-59 shows moderate impairment, 15-29 shows severe impairment  and <15 is ESRD.     CMP and Liver     Status: None   Collection Time: 07/16/14  9:42 AM  Result Value Ref Range   Sodium 138 135 - 145 mEq/L   Potassium 5.2 3.5 - 5.3 mEq/L   Chloride 100 96 - 112 mEq/L   CO2 27 19 - 32 mEq/L   Glucose, Bld 99 70 - 99 mg/dL   BUN 12 6 - 23 mg/dL   Creat 1.05 0.50 - 1.35 mg/dL   Total Bilirubin 0.5 0.2 - 1.2 mg/dL   Alkaline Phosphatase 75 39 - 117 U/L   AST 28 0 - 37 U/L   ALT 38 0 - 53 U/L   Total Protein 7.7 6.0 - 8.3 g/dL   Albumin 4.2 3.5 - 5.2 g/dL   Calcium 10.1 8.4 - 10.5 mg/dL   Bilirubin, Direct 0.1 0.0 - 0.3 mg/dL   Indirect Bilirubin 0.4 0.2 - 1.2 mg/dL  Comprehensive metabolic panel     Status: Abnormal   Collection Time: 07/20/14  1:30 PM  Result Value Ref Range   Sodium 132 (L) 135 - 145 mmol/L   Potassium 3.7 3.5 - 5.1 mmol/L   Chloride 98 96 - 112 mmol/L   CO2 21 19 - 32 mmol/L   Glucose, Bld 117 (H) 70 - 99 mg/dL   BUN 18 6 - 23 mg/dL   Creatinine, Ser 1.11 0.50 - 1.35 mg/dL   Calcium 9.8 8.4 - 10.5 mg/dL   Total Protein 8.5 (H) 6.0 - 8.3 g/dL   Albumin 4.8 3.5 - 5.2 g/dL   AST 40 (H) 0 - 37 U/L   ALT 53 0 - 53 U/L   Alkaline Phosphatase 83 39 - 117 U/L   Total Bilirubin 0.9 0.3 - 1.2 mg/dL   GFR calc non Af Amer 75 (L) >90 mL/min   GFR calc Af Amer 87 (L) >90 mL/min    Comment: (NOTE) The eGFR has been calculated using the CKD EPI equation. This calculation has not been validated in all clinical situations. eGFR's persistently <90 mL/min signify possible Chronic Kidney Disease.    Anion gap 13 5 - 15  Lipase, blood     Status: None   Collection Time: 07/20/14  1:30 PM  Result Value Ref Range   Lipase 30 11 - 59 U/L  CBC with Differential/Platelet     Status: Abnormal    Collection Time: 07/20/14  1:30 PM  Result Value Ref Range   WBC 14.7 (H) 4.0 - 10.5 K/uL   RBC 5.30 4.22 - 5.81 MIL/uL   Hemoglobin 18.1 (H) 13.0 - 17.0 g/dL   HCT 49.6 39.0 - 52.0 %   MCV 93.6 78.0 - 100.0 fL   MCH 34.2 (H) 26.0 - 34.0 pg   MCHC 36.5 (H) 30.0 - 36.0 g/dL   RDW 12.6 11.5 - 15.5 %   Platelets 280 150 - 400 K/uL   Neutrophils Relative % 79 (H) 43 - 77 %   Neutro Abs 11.5 (H) 1.7 - 7.7 K/uL   Lymphocytes Relative 16 12 - 46 %   Lymphs Abs 2.4 0.7 - 4.0 K/uL   Monocytes Relative 4 3 - 12 %   Monocytes Absolute 0.7 0.1 - 1.0 K/uL   Eosinophils Relative 1 0 - 5 %   Eosinophils Absolute 0.1 0.0 - 0.7 K/uL   Basophils Relative 0 0 - 1 %   Basophils Absolute 0.0 0.0 - 0.1 K/uL  Troponin I  Status: None   Collection Time: 07/20/14  1:30 PM  Result Value Ref Range   Troponin I <0.03 <0.031 ng/mL    Comment:        NO INDICATION OF MYOCARDIAL INJURY.   I-Stat Troponin, ED (not at [facility])     Status: None   Collection Time: 07/20/14  2:51 PM  Result Value Ref Range   Troponin i, poc 0.00 0.00 - 0.08 ng/mL   Comment 3            Comment: Due to the release kinetics of cTnI, a negative result within the first hours of the onset of symptoms does not rule out myocardial infarction with certainty. If myocardial infarction is still suspected, repeat the test at appropriate intervals.     BP 138/78 mmHg  Pulse 94  Ht _0  (1.803 m)  Wt 270 lb (122.471 kg)  BMI 37.67 kg/m2  SpO2 96%   Review of Systems  Constitutional: Negative.   Musculoskeletal: Positive for back pain.  Skin: Positive for rash.  Neurological: Negative for tremors.  Psychiatric/Behavioral: Positive for depression.    Mental Status Examination  Appearance: casual Alert: Yes Attention: fair  Cooperative: Yes Eye Contact: Good Speech: normal tone Psychomotor Activity: Normal Memory/Concentration: reasonable Oriented: person, place and time/date Mood: Dysphoric with some  irritability Affect: Congruent Thought Processes and Associations: Coherent Fund of Knowledge: Fair Thought Content: Suicidal ideation and Homicidal ideation were denied. Insight: Fair Judgement: Fair  Diagnosis: Maj. depressive disorder. Possible bipolar disorder depressed type without psychotic features. Chronic pain. PTSD tp be  Ruled out. Alcohol use disorder in sustained remission.  Cluster B traits per history.  Treatment Plan:   Stopped chemicals for 3 days and restarted back on 25 mg increase it in 1 week to 50 mg but does not increased further. It does help with mood symptoms but at higher doses causes rash. Overt benzodiazepines. Will start buspirone for anxiety 7.72m bid. Prescription given.  Discussed alcohol relapse no craving for now.   Medication Side effects, benefits and risks reviewed/discussed with Patient. Time given for patient to respond and asks questions regarding the Diagnosis and Medications. Safety concerns and to report to ER if suicidal or call 911. Relevant Medications refilled or called in to pharmacy. Discussed weight maintenance and Sleep Hygiene. Follow up with Primary care provider in regards to Medical conditions. Recommend compliance with medications and follow up office appointments. Discussed to avail opportunity to consider or/and continue Individual therapy with Counselor. Greater than 50% of time was spend in counseling and coordination of care with the patient.  Schedule for Follow up visit in 3 weeks or call in earlier as necessary.  AMerian Capron MD 07/31/2014

## 2014-08-01 ENCOUNTER — Ambulatory Visit (HOSPITAL_COMMUNITY)
Admission: RE | Admit: 2014-08-01 | Discharge: 2014-08-01 | Disposition: A | Discharge status: Home / Self Care | Payer: Self-pay | Source: Ambulatory Visit | Attending: Internal Medicine | Admitting: Internal Medicine

## 2014-08-01 ENCOUNTER — Telehealth: Payer: Self-pay | Admitting: *Deleted

## 2014-08-01 DIAGNOSIS — E780 Pure hypercholesterolemia: Secondary | ICD-10-CM | POA: Insufficient documentation

## 2014-08-01 DIAGNOSIS — W19XXXS Unspecified fall, sequela: Secondary | ICD-10-CM

## 2014-08-01 DIAGNOSIS — I10 Essential (primary) hypertension: Secondary | ICD-10-CM | POA: Insufficient documentation

## 2014-08-01 DIAGNOSIS — J449 Chronic obstructive pulmonary disease, unspecified: Secondary | ICD-10-CM | POA: Insufficient documentation

## 2014-08-01 DIAGNOSIS — Y92009 Unspecified place in unspecified non-institutional (private) residence as the place of occurrence of the external cause: Secondary | ICD-10-CM

## 2014-08-01 DIAGNOSIS — R930 Abnormal findings on diagnostic imaging of skull and head, not elsewhere classified: Secondary | ICD-10-CM | POA: Insufficient documentation

## 2014-08-01 DIAGNOSIS — R296 Repeated falls: Secondary | ICD-10-CM | POA: Insufficient documentation

## 2014-08-01 DIAGNOSIS — R937 Abnormal findings on diagnostic imaging of other parts of musculoskeletal system: Secondary | ICD-10-CM | POA: Insufficient documentation

## 2014-08-01 NOTE — Telephone Encounter (Signed)
[facility] called to say results are in for patient's recent MRI.  Routed to PCP

## 2014-08-02 ENCOUNTER — Telehealth: Payer: Self-pay | Admitting: Internal Medicine

## 2014-08-02 NOTE — Telephone Encounter (Signed)
Patient has been contacted to inform them that the PCP would ike to see them ASAP; PCP has asked if patient can come in on Monday Before 12 or After 2 for an abnormal MRI result; if patient calls back please schedule patient in PCP schedule;

## 2014-08-06 ENCOUNTER — Encounter: Payer: Self-pay | Admitting: Internal Medicine

## 2014-08-06 ENCOUNTER — Ambulatory Visit: Payer: Self-pay | Attending: Internal Medicine | Admitting: Internal Medicine

## 2014-08-06 VITALS — BP 136/82 | HR 97 | Temp 98.0°F | Resp 16 | Wt 273.0 lb

## 2014-08-06 DIAGNOSIS — I2510 Atherosclerotic heart disease of native coronary artery without angina pectoris: Secondary | ICD-10-CM | POA: Insufficient documentation

## 2014-08-06 DIAGNOSIS — G894 Chronic pain syndrome: Secondary | ICD-10-CM | POA: Insufficient documentation

## 2014-08-06 DIAGNOSIS — J449 Chronic obstructive pulmonary disease, unspecified: Secondary | ICD-10-CM | POA: Insufficient documentation

## 2014-08-06 DIAGNOSIS — M542 Cervicalgia: Secondary | ICD-10-CM | POA: Insufficient documentation

## 2014-08-06 DIAGNOSIS — G8929 Other chronic pain: Secondary | ICD-10-CM | POA: Insufficient documentation

## 2014-08-06 DIAGNOSIS — R9082 White matter disease, unspecified: Secondary | ICD-10-CM | POA: Insufficient documentation

## 2014-08-06 DIAGNOSIS — Z79891 Long term (current) use of opiate analgesic: Secondary | ICD-10-CM | POA: Insufficient documentation

## 2014-08-06 DIAGNOSIS — F17210 Nicotine dependence, cigarettes, uncomplicated: Secondary | ICD-10-CM | POA: Insufficient documentation

## 2014-08-06 DIAGNOSIS — E780 Pure hypercholesterolemia: Secondary | ICD-10-CM | POA: Insufficient documentation

## 2014-08-06 DIAGNOSIS — F319 Bipolar disorder, unspecified: Secondary | ICD-10-CM | POA: Insufficient documentation

## 2014-08-06 DIAGNOSIS — F4310 Post-traumatic stress disorder, unspecified: Secondary | ICD-10-CM | POA: Insufficient documentation

## 2014-08-06 DIAGNOSIS — I10 Essential (primary) hypertension: Secondary | ICD-10-CM | POA: Insufficient documentation

## 2014-08-06 DIAGNOSIS — M129 Arthropathy, unspecified: Secondary | ICD-10-CM | POA: Insufficient documentation

## 2014-08-06 LAB — COMPLETE METABOLIC PANEL WITH GFR
ALT: 31 U/L (ref 0–53)
AST: 24 U/L (ref 0–37)
Albumin: 3.6 g/dL (ref 3.5–5.2)
Alkaline Phosphatase: 78 U/L (ref 39–117)
BUN: 12 mg/dL (ref 6–23)
CALCIUM: 9.8 mg/dL (ref 8.4–10.5)
CHLORIDE: 101 meq/L (ref 96–112)
CO2: 28 meq/L (ref 19–32)
CREATININE: 0.97 mg/dL (ref 0.50–1.35)
GFR, Est African American: >89 mL/min
GFR, Est Non African American: 89 mL/min
Glucose, Bld: 129 mg/dL — ABNORMAL HIGH (ref 70–99)
Potassium: 4.9 mEq/L (ref 3.5–5.3)
Sodium: 138 mEq/L (ref 135–145)
TOTAL PROTEIN: 6.7 g/dL (ref 6.0–8.3)
Total Bilirubin: 0.3 mg/dL (ref 0.2–1.2)

## 2014-08-06 MED ORDER — ACETAMINOPHEN-CODEINE #3 300-30 MG PO TABS: 1.0000 | ORAL_TABLET | ORAL | Status: DC | PRN

## 2014-08-06 NOTE — Progress Notes (Signed)
Patient ID: Oscar Hall, male   DOB: Feb 07, 1961, 54 y.o.   MRN: [id number]   Rudolf Blizard, is a 54 y.o. male  [id number]  [id number]  DOB - [birth year]  Chief Complaint  Patient presents with  . Follow-up        Subjective:   Oscar Hall is a 54 y.o. male here today for a follow up visit. Patient has history of hypertension, type I bipolar disorder, PTSD, chronic neck pain, chronic back pain, coronary artery disease, COPD, nicotine abuse. He is here today for routine follow-up. He also wanted to review the result of his last MRI of the brain which was ordered because of complaints of frequent falls and loss of balance. The findings were as recorded below. Patient has no new complaint today. He continues to have pain in his neck and low back. He has no urinary symptoms. He denies nocturia. No change in bowel habits. He is compliant with blood pressure medication, claims blood pressure has been controlled. Patient has No headache, No chest pain, No abdominal pain - No Nausea, No new weakness tingling or numbness, No Cough - SOB.  Problem  Neck Pain, Chronic    ALLERGIES: Allergies  Allergen Reactions  . Penicillins Anaphylaxis and Swelling  . Vistaril [Hydroxyzine Hcl] Hives and Shortness Of Breath  . Sulfa Antibiotics Hives and Itching  . Lithium Other (See Comments)    Increases blood pressure.  . Tramadol Other (See Comments)    G.I. Upset   . Celebrex [Celecoxib] Other (See Comments)    G.I. Upset  . Neurontin [Gabapentin] Nausea And Vomiting  . Soma [Carisoprodol] Other (See Comments)    G.I. Upset   . Toradol [Ketorolac Tromethamine] Other (See Comments)    G.IMariah Milling     PAST MEDICAL HISTORY: Past Medical History  Diagnosis Date  . PTSD (post-traumatic stress disorder)   . Coronary artery disease   . Hypercholesterolemia   . Fatty liver   . Hypercholesteremia   . Hypertension   . COPD (chronic obstructive pulmonary disease)     MEDICATIONS AT  HOME: Prior to Admission medications   Medication Sig Start Date End Date Taking? Authorizing Provider  acetaminophen-codeine (TYLENOL #3) 300-30 MG per tablet Take 1 tablet by mouth every 4 (four) hours as needed. 08/06/14   Tresa Garter, MD  albuterol (PROVENTIL HFA;VENTOLIN HFA) 108 (90 BASE) MCG/ACT inhaler Inhale 2 puffs into the lungs every 6 (six) hours as needed for wheezing or shortness of breath. 11/30/13   Tresa Garter, MD  amitriptyline (ELAVIL) 50 MG tablet Take 1 tablet (50 mg total) by mouth at bedtime. 06/18/14   Tresa Garter, MD  busPIRone (BUSPAR) 7.5 MG tablet Take 1 tablet (7.5 mg total) by mouth 2 (two) times daily. 07/31/14 07/31/15  Merian Capron, MD  cyclobenzaprine (FLEXERIL) 10 MG tablet Take 1 tablet (10 mg total) by mouth 3 (three) times daily as needed for muscle spasms. Patient not taking: Reported on 07/20/2014 01/28/14   Rolland Porter, MD  ezetimibe-simvastatin (VYTORIN) 10-10 MG per tablet Take 1 tablet by mouth at bedtime. 12/12/13   Tresa Garter, MD  HYDROcodone-acetaminophen (NORCO/VICODIN) 5-325 MG per tablet Take 1-2 tablets by mouth every 6 (six) hours as needed. 07/20/14   Fredia Sorrow, MD  lamoTRIgine (LAMICTAL) 25 MG tablet Take one tablet a day for 5 days and then 2 tablets a day. Do not go higher then 50mg  07/31/14   Merian Capron, MD  lisinopril-hydrochlorothiazide (PRINZIDE,ZESTORETIC) 10-12.5 MG per tablet  Take 1 tablet by mouth daily.    Historical Provider, MD  ondansetron (ZOFRAN ODT) 4 MG disintegrating tablet Take 1 tablet (4 mg total) by mouth every 8 (eight) hours as needed. 07/20/14   Fredia Sorrow, MD  ondansetron (ZOFRAN) 4 MG tablet Take 1 tablet (4 mg total) by mouth every 8 (eight) hours as needed. Patient not taking: Reported on 07/20/2014 01/28/14   Rolland Porter, MD  Vitamin D, Ergocalciferol, (DRISDOL) 50000 UNITS CAPS capsule Take 1 capsule (50,000 Units total) by mouth every 7 (seven) days. 06/26/14   Tresa Garter, MD      Objective:   Filed Vitals:   08/06/14 0913  BP: 147/92  Pulse: 97  Temp: 98 F (36.7 C)  Resp: 16  Weight: 273 lb (123.832 kg)  SpO2: 99%    Exam General appearance : Awake, alert, not in any distress. Speech Clear. Not toxic looking HEENT: Atraumatic and Normocephalic, pupils equally reactive to light and accomodation Neck: supple, no JVD. No cervical lymphadenopathy.  Chest:Good air entry bilaterally, no added sounds  CVS: S1 S2 regular, no murmurs.  Abdomen: Bowel sounds present, Non tender and not distended with no gaurding, rigidity or rebound. Extremities: B/L Lower Ext shows no edema, both legs are warm to touch Neurology: Awake alert, and oriented X 3, CN II-XII intact, Non focal Skin:No Rash  Data Review Lab Results  Component Value Date   HGBA1C 5.7* 02/13/2013     Assessment & Plan   1. Chronic pain syndrome  - acetaminophen-codeine (TYLENOL #3) 300-30 MG per tablet; Take 1 tablet by mouth every 4 (four) hours as needed.  Dispense: 60 tablet; Refill: 0  2. Neck pain, chronic I reviewed the patient's most recent brain MRI which showed: "1. No acute intracranial findings or explanation for recurrent falls. 2. Abnormal C4 body. Metastatic disease is the primary concern and bone scan and PSA correlation is recommended. Given recent falls, fracture is an additional consideration and if there is acute neck pain noncontrast cervical spine CT is recommended. 3. At follow-up spinal imaging, attention to the left C3-4 facet where there are findings of inflammatory arthropathy. 4. Mild white matter disease, likely chronic small vessel ischemia".  We'll order tests and review results, if they show any abnormalities, then I will order further testing including bone scan.  - PSA - CT Cervical Spine Wo Contrast; Future  3. Cigarette nicotine dependence without complication  The patient was counseled on the dangers of tobacco use, and was advised to quit.  Reviewed strategies to maximize success, including removing cigarettes and smoking materials from environment, stress management and support of family/friends.   4. Essential hypertension  - COMPLETE METABOLIC PANEL WITH GFR  - We have discussed target BP range and blood pressure goal - I have advised patient to check BP regularly and to call us back or report to clinic if the numbers are consistently higher than 140/90  - We discussed the importance of compliance with medical therapy and DASH diet recommended, consequences of uncontrolled hypertension discussed.  - continue current BP medications  Patient have been counseled extensively about nutrition and exercise  Return in about 3 months (around 11/06/2014) for Routine Follow Up, Follow up HTN, Follow up Pain and comorbidities.  The patient was given clear instructions to go to ER or return to medical center if symptoms don't improve, worsen or new problems develop. The patient verbalized understanding. The patient was told to call to get lab results if they haven't heard  anything in the next week.   This note has been created with Surveyor, quantity. Any transcriptional errors are unintentional.    Angelica Chessman, MD, [place], [place], [place], [place] and [place], [place]   08/06/2014, 9:38 AM

## 2014-08-06 NOTE — Patient Instructions (Signed)
DASH Eating Plan DASH stands for "Dietary Approaches to Stop Hypertension." The DASH eating plan is a healthy eating plan that has been shown to reduce high blood pressure (hypertension). Additional health benefits may include reducing the risk of type 2 diabetes mellitus, heart disease, and stroke. The DASH eating plan may also help with weight loss. WHAT DO I NEED TO KNOW ABOUT THE DASH EATING PLAN? For the DASH eating plan, you will follow these general guidelines:  Choose foods with a percent daily value for sodium of less than 5% (as listed on the food label).  Use salt-free seasonings or herbs instead of table salt or sea salt.  Check with your health care provider or pharmacist before using salt substitutes.  Eat lower-sodium products, often labeled as "lower sodium" or "no salt added."  Eat fresh foods.  Eat more vegetables, fruits, and low-fat dairy products.  Choose whole grains. Look for the word "whole" as the first word in the ingredient list.  Choose fish and skinless chicken or turkey more often than red meat. Limit fish, poultry, and meat to 6 oz (170 g) each day.  Limit sweets, desserts, sugars, and sugary drinks.  Choose heart-healthy fats.  Limit cheese to 1 oz (28 g) per day.  Eat more home-cooked food and less restaurant, buffet, and fast food.  Limit fried foods.  Cook foods using methods other than frying.  Limit canned vegetables. If you do use them, rinse them well to decrease the sodium.  When eating at a restaurant, ask that your food be prepared with less salt, or no salt if possible. WHAT FOODS CAN I EAT? Seek help from a dietitian for individual calorie needs. Grains Whole grain or whole wheat bread. Brown rice. Whole grain or whole wheat pasta. Quinoa, bulgur, and whole grain cereals. Low-sodium cereals. Corn or whole wheat flour tortillas. Whole grain cornbread. Whole grain crackers. Low-sodium crackers. Vegetables Fresh or frozen vegetables  (raw, steamed, roasted, or grilled). Low-sodium or reduced-sodium tomato and vegetable juices. Low-sodium or reduced-sodium tomato sauce and paste. Low-sodium or reduced-sodium canned vegetables.  Fruits All fresh, canned (in natural juice), or frozen fruits. Meat and Other Protein Products Ground beef (85% or leaner), grass-fed beef, or beef trimmed of fat. Skinless chicken or turkey. Ground chicken or turkey. Pork trimmed of fat. All fish and seafood. Eggs. Dried beans, peas, or lentils. Unsalted nuts and seeds. Unsalted canned beans. Dairy Low-fat dairy products, such as skim or 1% milk, 2% or reduced-fat cheeses, low-fat ricotta or cottage cheese, or plain low-fat yogurt. Low-sodium or reduced-sodium cheeses. Fats and Oils Tub margarines without trans fats. Light or reduced-fat mayonnaise and salad dressings (reduced sodium). Avocado. Safflower, olive, or canola oils. Natural peanut or almond butter. Other Unsalted popcorn and pretzels. The items listed above may not be a complete list of recommended foods or beverages. Contact your dietitian for more options. WHAT FOODS ARE NOT RECOMMENDED? Grains White bread. White pasta. White rice. Refined cornbread. Bagels and croissants. Crackers that contain trans fat. Vegetables Creamed or fried vegetables. Vegetables in a cheese sauce. Regular canned vegetables. Regular canned tomato sauce and paste. Regular tomato and vegetable juices. Fruits Dried fruits. Canned fruit in light or heavy syrup. Fruit juice. Meat and Other Protein Products Fatty cuts of meat. Ribs, chicken wings, bacon, sausage, bologna, salami, chitterlings, fatback, hot dogs, bratwurst, and packaged luncheon meats. Salted nuts and seeds. Canned beans with salt. Dairy Whole or 2% milk, cream, half-and-half, and cream cheese. Whole-fat or sweetened yogurt. Full-fat   cheeses or blue cheese. Nondairy creamers and whipped toppings. Processed cheese, cheese spreads, or cheese  curds. Condiments Onion and garlic salt, seasoned salt, table salt, and sea salt. Canned and packaged gravies. Worcestershire sauce. Tartar sauce. Barbecue sauce. Teriyaki sauce. Soy sauce, including reduced sodium. Steak sauce. Fish sauce. Oyster sauce. Cocktail sauce. Horseradish. Ketchup and mustard. Meat flavorings and tenderizers. Bouillon cubes. Hot sauce. Tabasco sauce. Marinades. Taco seasonings. Relishes. Fats and Oils Butter, stick margarine, lard, shortening, ghee, and bacon fat. Coconut, palm kernel, or palm oils. Regular salad dressings. Other Pickles and olives. Salted popcorn and pretzels. The items listed above may not be a complete list of foods and beverages to avoid. Contact your dietitian for more information. WHERE CAN I FIND MORE INFORMATION? National Heart, Lung, and [employer] Institute: www.nhlbi.nih.gov/health/health-topics/topics/dash/ Document Released: 02/26/2011 Document Revised: 07/24/2013 Document Reviewed: 01/11/2013 ExitCare Patient Information 2015 [employer]. This information is not intended to replace advice given to you by your health care provider. Make sure you discuss any questions you have with your health care provider. Hypertension Hypertension, commonly called high blood pressure, is when the force of blood pumping through your arteries is too strong. Your arteries are the blood vessels that carry blood from your heart throughout your body. A blood pressure reading consists of a higher number over a lower number, such as 110/72. The higher number (systolic) is the pressure inside your arteries when your heart pumps. The lower number (diastolic) is the pressure inside your arteries when your heart relaxes. Ideally you want your blood pressure below 120/80. Hypertension forces your heart to work harder to pump blood. Your arteries may become narrow or stiff. Having hypertension puts you at risk for heart disease, stroke, and other problems.  RISK  FACTORS Some risk factors for high blood pressure are controllable. Others are not.  Risk factors you cannot control include:   Race. You may be at higher risk if you are African American.  Age. Risk increases with age.  Gender. Men are at higher risk than women before age 45 years. After age 65, women are at higher risk than men. Risk factors you can control include:  Not getting enough exercise or physical activity.  Being overweight.  Getting too much fat, sugar, calories, or salt in your diet.  Drinking too much alcohol. SIGNS AND SYMPTOMS Hypertension does not usually cause signs or symptoms. Extremely high blood pressure (hypertensive crisis) may cause headache, anxiety, shortness of breath, and nosebleed. DIAGNOSIS  To check if you have hypertension, your health care provider will measure your blood pressure while you are seated, with your arm held at the level of your heart. It should be measured at least twice using the same arm. Certain conditions can cause a difference in blood pressure between your right and left arms. A blood pressure reading that is higher than normal on one occasion does not mean that you need treatment. If one blood pressure reading is high, ask your health care provider about having it checked again. TREATMENT  Treating high blood pressure includes making lifestyle changes and possibly taking medicine. Living a healthy lifestyle can help lower high blood pressure. You may need to change some of your habits. Lifestyle changes may include:  Following the DASH diet. This diet is high in fruits, vegetables, and whole grains. It is low in salt, red meat, and added sugars.  Getting at least 2 hours of brisk physical activity every week.  Losing weight if necessary.  Not smoking.  Limiting   alcoholic beverages.  Learning ways to reduce stress. If lifestyle changes are not enough to get your blood pressure under control, your health care provider may  prescribe medicine. You may need to take more than one. Work closely with your health care provider to understand the risks and benefits. HOME CARE INSTRUCTIONS  Have your blood pressure rechecked as directed by your health care provider.   Take medicines only as directed by your health care provider. Follow the directions carefully. Blood pressure medicines must be taken as prescribed. The medicine does not work as well when you skip doses. Skipping doses also puts you at risk for problems.   Do not smoke.   Monitor your blood pressure at home as directed by your health care provider. SEEK MEDICAL CARE IF:   You think you are having a reaction to medicines taken.  You have recurrent headaches or feel dizzy.  You have swelling in your ankles.  You have trouble with your vision. SEEK IMMEDIATE MEDICAL CARE IF:  You develop a severe headache or confusion.  You have unusual weakness, numbness, or feel faint.  You have severe chest or abdominal pain.  You vomit repeatedly.  You have trouble breathing. MAKE SURE YOU:   Understand these instructions.  Will watch your condition.  Will get help right away if you are not doing well or get worse. Document Released: 03/09/2005 Document Revised: 07/24/2013 Document Reviewed: 12/30/2012 ExitCare Patient Information 2015 [employer]. This information is not intended to replace advice given to you by your health care provider. Make sure you discuss any questions you have with your health care provider.  

## 2014-08-06 NOTE — Progress Notes (Signed)
Patient here for follow up on his bipolar disease and ptsd Patient states he recently had an MRI of his head and would like those results as well

## 2014-08-07 LAB — PSA: PSA: 0.26 ng/mL (ref <=4.00)

## 2014-08-10 ENCOUNTER — Ambulatory Visit (HOSPITAL_COMMUNITY)
Admission: RE | Admit: 2014-08-10 | Discharge: 2014-08-10 | Disposition: A | Discharge status: Home / Self Care | Payer: Medicaid Other | Source: Ambulatory Visit | Attending: Internal Medicine | Admitting: Internal Medicine

## 2014-08-10 ENCOUNTER — Encounter (HOSPITAL_COMMUNITY): Payer: Self-pay

## 2014-08-10 DIAGNOSIS — G8929 Other chronic pain: Secondary | ICD-10-CM | POA: Diagnosis not present

## 2014-08-10 DIAGNOSIS — M542 Cervicalgia: Secondary | ICD-10-CM | POA: Insufficient documentation

## 2014-08-15 ENCOUNTER — Telehealth: Payer: Self-pay | Admitting: *Deleted

## 2014-08-15 NOTE — Telephone Encounter (Signed)
-----   Message from Tresa Garter, MD sent at 08/08/2014  4:39 PM EDT ----- Please inform patient that his laboratory test results are mostly within normal limit. His prostate enzyme was normal.

## 2014-08-15 NOTE — Telephone Encounter (Signed)
Spoke to patient and gave results.  Patient asking for CT spine results. Please advise

## 2014-08-21 ENCOUNTER — Encounter (HOSPITAL_COMMUNITY): Payer: Self-pay | Admitting: Psychiatry

## 2014-08-21 ENCOUNTER — Ambulatory Visit (INDEPENDENT_AMBULATORY_CARE_PROVIDER_SITE_OTHER): Payer: MEDICAID | Admitting: Psychiatry

## 2014-08-21 ENCOUNTER — Encounter (INDEPENDENT_AMBULATORY_CARE_PROVIDER_SITE_OTHER): Payer: Self-pay

## 2014-08-21 VITALS — BP 122/60 | HR 100 | Ht 71.0 in | Wt 273.0 lb

## 2014-08-21 DIAGNOSIS — F3113 Bipolar disorder, current episode manic without psychotic features, severe: Secondary | ICD-10-CM

## 2014-08-21 DIAGNOSIS — G8929 Other chronic pain: Secondary | ICD-10-CM | POA: Diagnosis not present

## 2014-08-21 DIAGNOSIS — F329 Major depressive disorder, single episode, unspecified: Secondary | ICD-10-CM | POA: Diagnosis not present

## 2014-08-21 DIAGNOSIS — F4310 Post-traumatic stress disorder, unspecified: Secondary | ICD-10-CM

## 2014-08-21 DIAGNOSIS — F1021 Alcohol dependence, in remission: Secondary | ICD-10-CM | POA: Diagnosis not present

## 2014-08-21 MED ORDER — LAMOTRIGINE 25 MG PO TABS: ORAL_TABLET | ORAL | Status: DC

## 2014-08-21 MED ORDER — BUSPIRONE HCL 10 MG PO TABS: 10.0000 mg | ORAL_TABLET | Freq: Two times a day (BID) | ORAL | Status: DC

## 2014-08-21 NOTE — Progress Notes (Signed)
Patient ID: Oscar Hall, male   DOB: [birth year], 54 y.o.   MRN: [id number] [place] Follow-up Outpatient Visit  Oscar Hall [id number] 54 y.o.  08/21/2014  Chief Complaint: depression and follow up.      History of Present Illness:    Oscar Hall is a 54 y/o male with a past psychiatric history significant for Bipolar I Disorder, most recent episode manic, severe, without psychoatic features, rule out Post traumatic stress disorder. Alcohol use disorder in sustained remission. Chronic pain. The patient is referred for psychiatric services for medication management.   Last visit he stopped Lamictal because he was having a rash. And restarted back on a small dose he is back at a dose of 50 mg. Describes mood to be somewhat better less depressed. There is no rash. In regarding to his anxiety related PTSD still has fear of being around people but he was able to "dine out with his sister once last week. This is an improvement Mood: upset at times but no psychosis.  Medical complexity/ Data; no rash today. Also mood  exacerbated by his shoulder and joint pains No relapse on alcohol for more then 3 years.    . Quality:  [place] [facility]. Sister lives next to him but sick so he has to take care of her.  Anxiety only slightly improved. Buspirone. There is no reported side effects  Has Applied for disability  . Severity:  Depression: 6/10 (0=Very depressed; 5=Neutral; 10=Very Happy)  Anxiety- 6/10 (0=no anxiety; 5= moderate/tolerable anxiety; 10= panic attacks)  . Duration: Since the age of 71  . Timing: Worse midday 10 AM to 4 Pm  . Context: Being in front of people.  . Modifying factors: Dog and medictions Group therapy.  . Associated signs and symptoms:   Past Medical History  Diagnosis Date  . PTSD (post-traumatic stress disorder)   . Coronary artery disease   . Hypercholesterolemia   . Fatty liver   . Hypercholesteremia   . Hypertension   . COPD  (chronic obstructive pulmonary disease)    Family History  Problem Relation Age of Onset  . Alcohol abuse Father   . Anxiety disorder Father   . Depression Father   . Coronary artery disease Father   . Alcohol abuse Brother   . Coronary artery disease Brother   . Schizophrenia Neg Hx   . Diabetes Mellitus II Neg Hx   . Drug abuse Brother   . Depression Brother 90    Commited suicide   . Cancer Mother 4    ovarian    Outpatient Encounter Prescriptions as of 08/21/2014  Medication Sig  . acetaminophen-codeine (TYLENOL #3) 300-30 MG per tablet Take 1 tablet by mouth every 4 (four) hours as needed.  Marland Kitchen albuterol (PROVENTIL HFA;VENTOLIN HFA) 108 (90 BASE) MCG/ACT inhaler Inhale 2 puffs into the lungs every 6 (six) hours as needed for wheezing or shortness of breath.  Marland Kitchen amitriptyline (ELAVIL) 50 MG tablet Take 1 tablet (50 mg total) by mouth at bedtime.  . busPIRone (BUSPAR) 10 MG tablet Take 1 tablet (10 mg total) by mouth 2 (two) times daily.  Marland Kitchen ezetimibe-simvastatin (VYTORIN) 10-10 MG per tablet Take 1 tablet by mouth at bedtime.  . lamoTRIgine (LAMICTAL) 25 MG tablet Take 2 tablets. Total of 45m. Do not increase dose higher then 542ma day.  . lisinopril-hydrochlorothiazide (PRINZIDE,ZESTORETIC) 10-12.5 MG per tablet Take 1 tablet by mouth daily.  . ondansetron (ZOFRAN ODT) 4 MG disintegrating tablet  Take 1 tablet (4 mg total) by mouth every 8 (eight) hours as needed.  . [DISCONTINUED] busPIRone (BUSPAR) 7.5 MG tablet Take 1 tablet (7.5 mg total) by mouth 2 (two) times daily.  . [DISCONTINUED] lamoTRIgine (LAMICTAL) 25 MG tablet Take one tablet a day for 5 days and then 2 tablets a day. Do not go higher then 3m  . cyclobenzaprine (FLEXERIL) 10 MG tablet Take 1 tablet (10 mg total) by mouth 3 (three) times daily as needed for muscle spasms. (Patient not taking: Reported on 07/20/2014)  . HYDROcodone-acetaminophen (NORCO/VICODIN) 5-325 MG per tablet Take 1-2 tablets by mouth every 6  (six) hours as needed. (Patient not taking: Reported on 08/21/2014)  . ondansetron (ZOFRAN) 4 MG tablet Take 1 tablet (4 mg total) by mouth every 8 (eight) hours as needed. (Patient not taking: Reported on 07/20/2014)  . Vitamin D, Ergocalciferol, (DRISDOL) 50000 UNITS CAPS capsule Take 1 capsule (50,000 Units total) by mouth every 7 (seven) days. (Patient not taking: Reported on 08/21/2014)   No facility-administered encounter medications on file as of 08/21/2014.    Recent Results (from the past 2160 hour(s))  Vit D  25 hydroxy (rtn osteoporosis monitoring)     Status: Abnormal   Collection Time: 06/18/14 12:27 PM  Result Value Ref Range   Vit D, 25-Hydroxy 15 (L) 30 - 100 ng/mL    Comment: Vitamin D Status           25-OH Vitamin D        Deficiency                <20 ng/mL        Insufficiency         20 - 29 ng/mL        Optimal             > or = 30 ng/mL   For 25-OH Vitamin D testing on patients on D2-supplementation and patients for whom quantitation of D2 and D3 fractions is required, the QuestAssureD 25-OH VIT D, (D2,D3), LC/MS/MS is recommended: order code [phone](patients > 2 yrs).   COMPLETE METABOLIC PANEL WITH GFR     Status: Abnormal   Collection Time: 06/18/14 12:27 PM  Result Value Ref Range   Sodium 135 135 - 145 mEq/L   Potassium 4.5 3.5 - 5.3 mEq/L   Chloride 96 96 - 112 mEq/L   CO2 26 19 - 32 mEq/L   Glucose, Bld 88 70 - 99 mg/dL   BUN 13 6 - 23 mg/dL   Creat 0.92 0.50 - 1.35 mg/dL   Total Bilirubin 0.7 0.2 - 1.2 mg/dL   Alkaline Phosphatase 74 39 - 117 U/L   AST 40 (H) 0 - 37 U/L   ALT 65 (H) 0 - 53 U/L   Total Protein 7.8 6.0 - 8.3 g/dL   Albumin 4.4 3.5 - 5.2 g/dL   Calcium 9.9 8.4 - 10.5 mg/dL   GFR, Est African American >89 mL/min   GFR, Est Non African American >89 mL/min    Comment:   The estimated GFR is a calculation valid for adults (>=163years old) that uses the CKD-EPI algorithm to adjust for age and sex. It is   not to be used for children,  pregnant women, hospitalized patients,    patients on dialysis, or with rapidly changing kidney function. According to the NKDEP, eGFR >89 is normal, 60-89 shows mild impairment, 30-59 shows moderate impairment, 15-29 shows severe impairment and <15 is  ESRD.     CMP and Liver     Status: None   Collection Time: 07/16/14  9:42 AM  Result Value Ref Range   Sodium 138 135 - 145 mEq/L   Potassium 5.2 3.5 - 5.3 mEq/L   Chloride 100 96 - 112 mEq/L   CO2 27 19 - 32 mEq/L   Glucose, Bld 99 70 - 99 mg/dL   BUN 12 6 - 23 mg/dL   Creat 1.05 0.50 - 1.35 mg/dL   Total Bilirubin 0.5 0.2 - 1.2 mg/dL   Alkaline Phosphatase 75 39 - 117 U/L   AST 28 0 - 37 U/L   ALT 38 0 - 53 U/L   Total Protein 7.7 6.0 - 8.3 g/dL   Albumin 4.2 3.5 - 5.2 g/dL   Calcium 10.1 8.4 - 10.5 mg/dL   Bilirubin, Direct 0.1 0.0 - 0.3 mg/dL   Indirect Bilirubin 0.4 0.2 - 1.2 mg/dL  Comprehensive metabolic panel     Status: Abnormal   Collection Time: 07/20/14  1:30 PM  Result Value Ref Range   Sodium 132 (L) 135 - 145 mmol/L   Potassium 3.7 3.5 - 5.1 mmol/L   Chloride 98 96 - 112 mmol/L   CO2 21 19 - 32 mmol/L   Glucose, Bld 117 (H) 70 - 99 mg/dL   BUN 18 6 - 23 mg/dL   Creatinine, Ser 1.11 0.50 - 1.35 mg/dL   Calcium 9.8 8.4 - 10.5 mg/dL   Total Protein 8.5 (H) 6.0 - 8.3 g/dL   Albumin 4.8 3.5 - 5.2 g/dL   AST 40 (H) 0 - 37 U/L   ALT 53 0 - 53 U/L   Alkaline Phosphatase 83 39 - 117 U/L   Total Bilirubin 0.9 0.3 - 1.2 mg/dL   GFR calc non Af Amer 75 (L) >90 mL/min   GFR calc Af Amer 87 (L) >90 mL/min    Comment: (NOTE) The eGFR has been calculated using the CKD EPI equation. This calculation has not been validated in all clinical situations. eGFR's persistently <90 mL/min signify possible Chronic Kidney Disease.    Anion gap 13 5 - 15  Lipase, blood     Status: None   Collection Time: 07/20/14  1:30 PM  Result Value Ref Range   Lipase 30 11 - 59 U/L  CBC with Differential/Platelet     Status: Abnormal    Collection Time: 07/20/14  1:30 PM  Result Value Ref Range   WBC 14.7 (H) 4.0 - 10.5 K/uL   RBC 5.30 4.22 - 5.81 MIL/uL   Hemoglobin 18.1 (H) 13.0 - 17.0 g/dL   HCT 49.6 39.0 - 52.0 %   MCV 93.6 78.0 - 100.0 fL   MCH 34.2 (H) 26.0 - 34.0 pg   MCHC 36.5 (H) 30.0 - 36.0 g/dL   RDW 12.6 11.5 - 15.5 %   Platelets 280 150 - 400 K/uL   Neutrophils Relative % 79 (H) 43 - 77 %   Neutro Abs 11.5 (H) 1.7 - 7.7 K/uL   Lymphocytes Relative 16 12 - 46 %   Lymphs Abs 2.4 0.7 - 4.0 K/uL   Monocytes Relative 4 3 - 12 %   Monocytes Absolute 0.7 0.1 - 1.0 K/uL   Eosinophils Relative 1 0 - 5 %   Eosinophils Absolute 0.1 0.0 - 0.7 K/uL   Basophils Relative 0 0 - 1 %   Basophils Absolute 0.0 0.0 - 0.1 K/uL  Troponin I     Status:  None   Collection Time: 07/20/14  1:30 PM  Result Value Ref Range   Troponin I <0.03 <0.031 ng/mL    Comment:        NO INDICATION OF MYOCARDIAL INJURY.   I-Stat Troponin, ED (not at [facility])     Status: None   Collection Time: 07/20/14  2:51 PM  Result Value Ref Range   Troponin i, poc 0.00 0.00 - 0.08 ng/mL   Comment 3            Comment: Due to the release kinetics of cTnI, a negative result within the first hours of the onset of symptoms does not rule out myocardial infarction with certainty. If myocardial infarction is still suspected, repeat the test at appropriate intervals.   PSA     Status: None   Collection Time: 08/06/14  9:41 AM  Result Value Ref Range   PSA 0.26 <=4.00 ng/mL    Comment: Test Methodology: ECLIA PSA (Electrochemiluminescence Immunoassay)   For PSA values from 2.5-4.0, particularly in younger men <38 years old, the AUA and NCCN suggest testing for % Free PSA (3515) and evaluation of the rate of increase in PSA (PSA velocity).   COMPLETE METABOLIC PANEL WITH GFR     Status: Abnormal   Collection Time: 08/06/14  9:41 AM  Result Value Ref Range   Sodium 138 135 - 145 mEq/L   Potassium 4.9 3.5 - 5.3 mEq/L   Chloride 101 96 - 112 mEq/L    CO2 28 19 - 32 mEq/L   Glucose, Bld 129 (H) 70 - 99 mg/dL   BUN 12 6 - 23 mg/dL   Creat 0.97 0.50 - 1.35 mg/dL   Total Bilirubin 0.3 0.2 - 1.2 mg/dL   Alkaline Phosphatase 78 39 - 117 U/L   AST 24 0 - 37 U/L   ALT 31 0 - 53 U/L   Total Protein 6.7 6.0 - 8.3 g/dL   Albumin 3.6 3.5 - 5.2 g/dL   Calcium 9.8 8.4 - 10.5 mg/dL   GFR, Est African American >89 mL/min   GFR, Est Non African American 89 mL/min    Comment:   The estimated GFR is a calculation valid for adults (>=94 years old) that uses the CKD-EPI algorithm to adjust for age and sex. It is   not to be used for children, pregnant women, hospitalized patients,    patients on dialysis, or with rapidly changing kidney function. According to the NKDEP, eGFR >89 is normal, 60-89 shows mild impairment, 30-59 shows moderate impairment, 15-29 shows severe impairment and <15 is ESRD.       BP 122/60 mmHg  Pulse 100  Ht '5\' 11"'  (1.803 m)  Wt 273 lb (123.832 kg)  BMI 38.09 kg/m2  SpO2 97%   Review of Systems  Skin: Negative for rash.  Neurological: Negative for tingling, tremors and headaches.  Psychiatric/Behavioral: Positive for depression. Negative for substance abuse.    Mental Status Examination  Appearance: casual Alert: Yes Attention: fair  Cooperative: Yes Eye Contact: Good Speech: normal tone Psychomotor Activity: Normal Memory/Concentration: reasonable Oriented: person, place and time/date Mood: Dysphoric with some irritability Affect: Congruent Thought Processes and Associations: Coherent Fund of Knowledge: Fair Thought Content: Suicidal ideation and Homicidal ideation were denied. Insight: Fair Judgement: Fair  Diagnosis: Maj. depressive disorder. Possible bipolar disorder depressed type without psychotic features. Chronic pain. PTSD tp be  Ruled out. Alcohol use disorder in sustained remission.  Cluster B traits per history.  Treatment Plan:  Continue lamictal 50-mg  For mood symptoms. Do not  increase dose Anxiety and PTSD: increase buspirone 18m bid. Chronic pain dealt with other providers it does effect his mood and sleep.  Discussed alcohol relapse no craving for now.  Nicotine use; He has cut down and counselling given. He is working on quitting in next month.  Medication Side effects, benefits and risks reviewed/discussed with Patient. Time given for patient to respond and asks questions regarding the Diagnosis and Medications. Safety concerns and to report to ER if suicidal or call 911. Relevant Medications refilled or called in to pharmacy. Discussed weight maintenance and Sleep Hygiene. Follow up with Primary care provider in regards to Medical conditions. Recommend compliance with medications and follow up office appointments. Discussed to avail opportunity to consider or/and continue Individual therapy with Counselor. Greater than 50% of time was spend in counseling and coordination of care with the patient.  Schedule for Follow up visit in 3 weeks or call in earlier as necessary.  AMerian Capron MD 08/21/2014

## 2014-08-23 ENCOUNTER — Ambulatory Visit: Payer: Self-pay | Attending: Internal Medicine | Admitting: Internal Medicine

## 2014-08-23 ENCOUNTER — Encounter: Payer: Self-pay | Admitting: Internal Medicine

## 2014-08-23 VITALS — BP 161/111 | HR 102 | Temp 97.6°F | Ht 71.0 in | Wt 260.0 lb

## 2014-08-23 DIAGNOSIS — I10 Essential (primary) hypertension: Secondary | ICD-10-CM | POA: Insufficient documentation

## 2014-08-23 DIAGNOSIS — R030 Elevated blood-pressure reading, without diagnosis of hypertension: Secondary | ICD-10-CM

## 2014-08-23 DIAGNOSIS — F17210 Nicotine dependence, cigarettes, uncomplicated: Secondary | ICD-10-CM

## 2014-08-23 DIAGNOSIS — G894 Chronic pain syndrome: Secondary | ICD-10-CM

## 2014-08-23 DIAGNOSIS — IMO0001 Reserved for inherently not codable concepts without codable children: Secondary | ICD-10-CM

## 2014-08-23 MED ORDER — ACETAMINOPHEN-CODEINE #3 300-30 MG PO TABS: 1.0000 | ORAL_TABLET | Freq: Three times a day (TID) | ORAL | Status: DC | PRN

## 2014-08-23 MED ORDER — LISINOPRIL-HYDROCHLOROTHIAZIDE 20-25 MG PO TABS: 1.0000 | ORAL_TABLET | Freq: Every day | ORAL | Status: DC

## 2014-08-23 MED ORDER — CLONIDINE HCL 0.1 MG PO TABS
0.1000 mg | ORAL_TABLET | Freq: Once | ORAL | Status: AC
  Administered 2014-08-23: 0.1 mg via ORAL

## 2014-08-23 NOTE — Progress Notes (Signed)
Patient states he checks his blood pressure at home each day several times and it ranges [id number]/052'B [id number] Today it is 160/107 clonidine given Patient states he smokes 8 cigarettes per day Patient has taken his medications this morning and has eaten breakfast Ct scan recently done on neck patient wants results

## 2014-08-23 NOTE — Patient Instructions (Signed)
Smoking Cessation Quitting smoking is important to your health and has many advantages. However, it is not always easy to quit since nicotine is a very addictive drug. Oftentimes, people try 3 times or more before being able to quit. This document explains the best ways for you to prepare to quit smoking. Quitting takes hard work and a lot of effort, but you can do it. ADVANTAGES OF QUITTING SMOKING  You will live longer, feel better, and live better.  Your body will feel the impact of quitting smoking almost immediately.  Within 20 minutes, blood pressure decreases. Your pulse returns to its normal level.  After 8 hours, carbon monoxide levels in the blood return to normal. Your oxygen level increases.  After 24 hours, the chance of having a heart attack starts to decrease. Your breath, hair, and body stop smelling like smoke.  After 48 hours, damaged nerve endings begin to recover. Your sense of taste and smell improve.  After 72 hours, the body is virtually free of nicotine. Your bronchial tubes relax and breathing becomes easier.  After 2 to 12 weeks, lungs can hold more air. Exercise becomes easier and circulation improves.  The risk of having a heart attack, stroke, cancer, or lung disease is greatly reduced.  After 1 year, the risk of coronary heart disease is cut in half.  After 5 years, the risk of stroke falls to the same as a nonsmoker.  After 10 years, the risk of lung cancer is cut in half and the risk of other cancers decreases significantly.  After 15 years, the risk of coronary heart disease drops, usually to the level of a nonsmoker.  If you are pregnant, quitting smoking will improve your chances of having a healthy baby.  The people you live with, especially any children, will be healthier.  You will have extra money to spend on things other than cigarettes. QUESTIONS TO THINK ABOUT BEFORE ATTEMPTING TO QUIT You may want to talk about your answers with your  health care provider.  Why do you want to quit?  If you tried to quit in the past, what helped and what did not?  What will be the most difficult situations for you after you quit? How will you plan to handle them?  Who can help you through the tough times? Your family? Friends? A health care provider?  What pleasures do you get from smoking? What ways can you still get pleasure if you quit? Here are some questions to ask your health care provider:  How can you help me to be successful at quitting?  What medicine do you think would be best for me and how should I take it?  What should I do if I need more help?  What is smoking withdrawal like? How can I get information on withdrawal? GET READY  Set a quit date.  Change your environment by getting rid of all cigarettes, ashtrays, matches, and lighters in your home, car, or work. Do not let people smoke in your home.  Review your past attempts to quit. Think about what worked and what did not. GET SUPPORT AND ENCOURAGEMENT You have a better chance of being successful if you have help. You can get support in many ways.  Tell your family, friends, and coworkers that you are going to quit and need their support. Ask them not to smoke around you.  Get individual, group, or telephone counseling and support. Programs are available at local hospitals and health centers. Call   your local health department for information about programs in your area.  Spiritual beliefs and practices may help some smokers quit.  Download a "quit meter" on your computer to keep track of quit statistics, such as how long you have gone without smoking, cigarettes not smoked, and money saved.  Get a self-help book about quitting smoking and staying off tobacco. LEARN NEW SKILLS AND BEHAVIORS  Distract yourself from urges to smoke. Talk to someone, go for a walk, or occupy your time with a task.  Change your normal routine. Take a different route to work.  Drink tea instead of coffee. Eat breakfast in a different place.  Reduce your stress. Take a hot bath, exercise, or read a book.  Plan something enjoyable to do every day. Reward yourself for not smoking.  Explore interactive web-based programs that specialize in helping you quit. GET MEDICINE AND USE IT CORRECTLY Medicines can help you stop smoking and decrease the urge to smoke. Combining medicine with the above behavioral methods and support can greatly increase your chances of successfully quitting smoking.  Nicotine replacement therapy helps deliver nicotine to your body without the negative effects and risks of smoking. Nicotine replacement therapy includes nicotine gum, lozenges, inhalers, nasal sprays, and skin patches. Some may be available over-the-counter and others require a prescription.  Antidepressant medicine helps people abstain from smoking, but how this works is unknown. This medicine is available by prescription.  Nicotinic receptor partial agonist medicine simulates the effect of nicotine in your brain. This medicine is available by prescription. Ask your health care provider for advice about which medicines to use and how to use them based on your health history. Your health care provider will tell you what side effects to look out for if you choose to be on a medicine or therapy. Carefully read the information on the package. Do not use any other product containing nicotine while using a nicotine replacement product.  RELAPSE OR DIFFICULT SITUATIONS Most relapses occur within the first 3 months after quitting. Do not be discouraged if you start smoking again. Remember, most people try several times before finally quitting. You may have symptoms of withdrawal because your body is used to nicotine. You may crave cigarettes, be irritable, feel very hungry, cough often, get headaches, or have difficulty concentrating. The withdrawal symptoms are only temporary. They are strongest  when you first quit, but they will go away within 10-14 days. To reduce the chances of relapse, try to:  Avoid drinking alcohol. Drinking lowers your chances of successfully quitting.  Reduce the amount of caffeine you consume. Once you quit smoking, the amount of caffeine in your body increases and can give you symptoms, such as a rapid heartbeat, sweating, and anxiety.  Avoid smokers because they can make you want to smoke.  Do not let weight gain distract you. Many smokers will gain weight when they quit, usually less than 10 pounds. Eat a healthy diet and stay active. You can always lose the weight gained after you quit.  Find ways to improve your mood other than smoking. FOR MORE INFORMATION  www.smokefree.gov  Document Released: 03/03/2001 Document Revised: 07/24/2013 Document Reviewed: 06/18/2011 [employer] Patient Information 2015 [employer]. This information is not intended to replace advice given to you by your health care provider. Make sure you discuss any questions you have with your health care provider. DASH Eating Plan DASH stands for "Dietary Approaches to Stop Hypertension." The DASH eating plan is a healthy eating plan that has   been shown to reduce high blood pressure (hypertension). Additional health benefits may include reducing the risk of type 2 diabetes mellitus, heart disease, and stroke. The DASH eating plan may also help with weight loss. WHAT DO I NEED TO KNOW ABOUT THE DASH EATING PLAN? For the DASH eating plan, you will follow these general guidelines:  Choose foods with a percent daily value for sodium of less than 5% (as listed on the food label).  Use salt-free seasonings or herbs instead of table salt or sea salt.  Check with your health care provider or pharmacist before using salt substitutes.  Eat lower-sodium products, often labeled as "lower sodium" or "no salt added."  Eat fresh foods.  Eat more vegetables, fruits, and low-fat dairy  products.  Choose whole grains. Look for the word "whole" as the first word in the ingredient list.  Choose fish and skinless chicken or turkey more often than red meat. Limit fish, poultry, and meat to 6 oz (170 g) each day.  Limit sweets, desserts, sugars, and sugary drinks.  Choose heart-healthy fats.  Limit cheese to 1 oz (28 g) per day.  Eat more home-cooked food and less restaurant, buffet, and fast food.  Limit fried foods.  Cook foods using methods other than frying.  Limit canned vegetables. If you do use them, rinse them well to decrease the sodium.  When eating at a restaurant, ask that your food be prepared with less salt, or no salt if possible. WHAT FOODS CAN I EAT? Seek help from a dietitian for individual calorie needs. Grains Whole grain or whole wheat bread. Brown rice. Whole grain or whole wheat pasta. Quinoa, bulgur, and whole grain cereals. Low-sodium cereals. Corn or whole wheat flour tortillas. Whole grain cornbread. Whole grain crackers. Low-sodium crackers. Vegetables Fresh or frozen vegetables (raw, steamed, roasted, or grilled). Low-sodium or reduced-sodium tomato and vegetable juices. Low-sodium or reduced-sodium tomato sauce and paste. Low-sodium or reduced-sodium canned vegetables.  Fruits All fresh, canned (in natural juice), or frozen fruits. Meat and Other Protein Products Ground beef (85% or leaner), grass-fed beef, or beef trimmed of fat. Skinless chicken or turkey. Ground chicken or turkey. Pork trimmed of fat. All fish and seafood. Eggs. Dried beans, peas, or lentils. Unsalted nuts and seeds. Unsalted canned beans. Dairy Low-fat dairy products, such as skim or 1% milk, 2% or reduced-fat cheeses, low-fat ricotta or cottage cheese, or plain low-fat yogurt. Low-sodium or reduced-sodium cheeses. Fats and Oils Tub margarines without trans fats. Light or reduced-fat mayonnaise and salad dressings (reduced sodium). Avocado. Safflower, olive, or canola  oils. Natural peanut or almond butter. Other Unsalted popcorn and pretzels. The items listed above may not be a complete list of recommended foods or beverages. Contact your dietitian for more options. WHAT FOODS ARE NOT RECOMMENDED? Grains White bread. White pasta. White rice. Refined cornbread. Bagels and croissants. Crackers that contain trans fat. Vegetables Creamed or fried vegetables. Vegetables in a cheese sauce. Regular canned vegetables. Regular canned tomato sauce and paste. Regular tomato and vegetable juices. Fruits Dried fruits. Canned fruit in light or heavy syrup. Fruit juice. Meat and Other Protein Products Fatty cuts of meat. Ribs, chicken wings, bacon, sausage, bologna, salami, chitterlings, fatback, hot dogs, bratwurst, and packaged luncheon meats. Salted nuts and seeds. Canned beans with salt. Dairy Whole or 2% milk, cream, half-and-half, and cream cheese. Whole-fat or sweetened yogurt. Full-fat cheeses or blue cheese. Nondairy creamers and whipped toppings. Processed cheese, cheese spreads, or cheese curds. Condiments Onion and garlic salt,   seasoned salt, table salt, and sea salt. Canned and packaged gravies. Worcestershire sauce. Tartar sauce. Barbecue sauce. Teriyaki sauce. Soy sauce, including reduced sodium. Steak sauce. Fish sauce. Oyster sauce. Cocktail sauce. Horseradish. Ketchup and mustard. Meat flavorings and tenderizers. Bouillon cubes. Hot sauce. Tabasco sauce. Marinades. Taco seasonings. Relishes. Fats and Oils Butter, stick margarine, lard, shortening, ghee, and bacon fat. Coconut, palm kernel, or palm oils. Regular salad dressings. Other Pickles and olives. Salted popcorn and pretzels. The items listed above may not be a complete list of foods and beverages to avoid. Contact your dietitian for more information. WHERE CAN I FIND MORE INFORMATION? National Heart, Lung, and Blood Institute: travelstabloid.com Document Released:  02/26/2011 Document Revised: 07/24/2013 Document Reviewed: 01/11/2013 [facility] Patient Information 2015 [place], [place]. This information is not intended to replace advice given to you by your health care provider. Make sure you discuss any questions you have with your health care provider. Hypertension Hypertension, commonly called high blood pressure, is when the force of blood pumping through your arteries is too strong. Your arteries are the blood vessels that carry blood from your heart throughout your body. A blood pressure reading consists of a higher number over a lower number, such as 110/72. The higher number (systolic) is the pressure inside your arteries when your heart pumps. The lower number (diastolic) is the pressure inside your arteries when your heart relaxes. Ideally you want your blood pressure below 120/80. Hypertension forces your heart to work harder to pump blood. Your arteries may become narrow or stiff. Having hypertension puts you at risk for heart disease, stroke, and other problems.  RISK FACTORS Some risk factors for high blood pressure are controllable. Others are not.  Risk factors you cannot control include:   Race. You may be at higher risk if you are African American.  Age. Risk increases with age.  Gender. Men are at higher risk than women before age 19 years. After age 82, women are at higher risk than men. Risk factors you can control include:  Not getting enough exercise or physical activity.  Being overweight.  Getting too much fat, sugar, calories, or salt in your diet.  Drinking too much alcohol. SIGNS AND SYMPTOMS Hypertension does not usually cause signs or symptoms. Extremely high blood pressure (hypertensive crisis) may cause headache, anxiety, shortness of breath, and nosebleed. DIAGNOSIS  To check if you have hypertension, your health care provider will measure your blood pressure while you are seated, with your arm held at the level of your  heart. It should be measured at least twice using the same arm. Certain conditions can cause a difference in blood pressure between your right and left arms. A blood pressure reading that is higher than normal on one occasion does not mean that you need treatment. If one blood pressure reading is high, ask your health care provider about having it checked again. TREATMENT  Treating high blood pressure includes making lifestyle changes and possibly taking medicine. Living a healthy lifestyle can help lower high blood pressure. You may need to change some of your habits. Lifestyle changes may include:  Following the DASH diet. This diet is high in fruits, vegetables, and whole grains. It is low in salt, red meat, and added sugars.  Getting at least 2 hours of brisk physical activity every week.  Losing weight if necessary.  Not smoking.  Limiting alcoholic beverages.  Learning ways to reduce stress. If lifestyle changes are not enough to get your blood pressure under control,  your health care provider may prescribe medicine. You may need to take more than one. Work closely with your health care provider to understand the risks and benefits. HOME CARE INSTRUCTIONS  Have your blood pressure rechecked as directed by your health care provider.   Take medicines only as directed by your health care provider. Follow the directions carefully. Blood pressure medicines must be taken as prescribed. The medicine does not work as well when you skip doses. Skipping doses also puts you at risk for problems.   Do not smoke.   Monitor your blood pressure at home as directed by your health care provider. SEEK MEDICAL CARE IF:   You think you are having a reaction to medicines taken.  You have recurrent headaches or feel dizzy.  You have swelling in your ankles.  You have trouble with your vision. SEEK IMMEDIATE MEDICAL CARE IF:  You develop a severe headache or confusion.  You have unusual  weakness, numbness, or feel faint.  You have severe chest or abdominal pain.  You vomit repeatedly.  You have trouble breathing. MAKE SURE YOU:   Understand these instructions.  Will watch your condition.  Will get help right away if you are not doing well or get worse. Document Released: 03/09/2005 Document Revised: 07/24/2013 Document Reviewed: 12/30/2012 [facility] Patient Information 2015 [place], [place]. This information is not intended to replace advice given to you by your health care provider. Make sure you discuss any questions you have with your health care provider.

## 2014-08-23 NOTE — Progress Notes (Signed)
Patient ID: Oscar Hall, male   DOB: Feb 11, 1961, 54 y.o.   MRN: [id number]   Oscar Hall, is a 54 y.o. male  [id number]  [id number]  DOB - [birth year]  Chief Complaint  Patient presents with  . Hypertension        Subjective:   Oscar Hall is a 54 y.o. male here today for a follow up visit. Patient came in because of persistently elevated blood pressure even at home despite compliant with medications. Patient claimed he used to be on a higher dose of the current medication, but was reduced because of low blood pressure in the past and now blood pressure is back up. He continues to smoke but at a reduced rate, now about 8 cigarettes per day. Patient recently had an abnormal MRI of cervical spine with recommendation of a follow-up CT scan to rule out metastatic disease, CT of cervical spine was done and patient is here to review the results. There is no other complaint today except for ongoing neck pain. Patient has had neck pain for quite a while, had received intra-articular injection at the pain clinic with no sustained relief. Patient is not able to go to pain clinic because of insurance issues. Patient has No headache, No chest pain, No abdominal pain - No Nausea, No new weakness tingling or numbness, No Cough - SOB.  Problem  Elevated Blood Pressure  Uncontrolled Hypertension    ALLERGIES: Allergies  Allergen Reactions  . Penicillins Anaphylaxis and Swelling  . Vistaril [Hydroxyzine Hcl] Hives and Shortness Of Breath  . Sulfa Antibiotics Hives and Itching  . Lithium Other (See Comments)    Increases blood pressure.  . Tramadol Other (See Comments)    G.I. Upset   . Celebrex [Celecoxib] Other (See Comments)    G.I. Upset  . Neurontin [Gabapentin] Nausea And Vomiting  . Soma [Carisoprodol] Other (See Comments)    G.I. Upset   . Toradol [Ketorolac Tromethamine] Other (See Comments)    G.IMariah Milling     PAST MEDICAL HISTORY: Past Medical History  Diagnosis Date  .  PTSD (post-traumatic stress disorder)   . Coronary artery disease   . Hypercholesterolemia   . Fatty liver   . Hypercholesteremia   . Hypertension   . COPD (chronic obstructive pulmonary disease)     MEDICATIONS AT HOME: Prior to Admission medications   Medication Sig Start Date End Date Taking? Authorizing Provider  acetaminophen-codeine (TYLENOL #3) 300-30 MG per tablet Take 1 tablet by mouth every 8 (eight) hours as needed. 08/23/14  Yes Tresa Garter, MD  albuterol (PROVENTIL HFA;VENTOLIN HFA) 108 (90 BASE) MCG/ACT inhaler Inhale 2 puffs into the lungs every 6 (six) hours as needed for wheezing or shortness of breath. 11/30/13  Yes Tresa Garter, MD  amitriptyline (ELAVIL) 50 MG tablet Take 1 tablet (50 mg total) by mouth at bedtime. 06/18/14  Yes Allona Gondek Essie Christine, MD  busPIRone (BUSPAR) 10 MG tablet Take 1 tablet (10 mg total) by mouth 2 (two) times daily. 08/21/14 08/21/15 Yes Merian Capron, MD  ezetimibe-simvastatin (VYTORIN) 10-10 MG per tablet Take 1 tablet by mouth at bedtime. 12/12/13  Yes Tresa Garter, MD  lamoTRIgine (LAMICTAL) 25 MG tablet Take 2 tablets. Total of 50mg . Do not increase dose higher then 50mg  a day. 08/21/14  Yes Merian Capron, MD  ondansetron (ZOFRAN ODT) 4 MG disintegrating tablet Take 1 tablet (4 mg total) by mouth every 8 (eight) hours as needed. 07/20/14  Yes Fredia Sorrow, MD  cyclobenzaprine (  FLEXERIL) 10 MG tablet Take 1 tablet (10 mg total) by mouth 3 (three) times daily as needed for muscle spasms. Patient not taking: Reported on 07/20/2014 01/28/14   Rolland Porter, MD  HYDROcodone-acetaminophen (NORCO/VICODIN) 5-325 MG per tablet Take 1-2 tablets by mouth every 6 (six) hours as needed. Patient not taking: Reported on 08/21/2014 07/20/14   Fredia Sorrow, MD  lisinopril-hydrochlorothiazide (PRINZIDE,ZESTORETIC) 20-25 MG per tablet Take 1 tablet by mouth daily. 08/23/14   Tresa Garter, MD  ondansetron (ZOFRAN) 4 MG tablet Take 1 tablet (4 mg  total) by mouth every 8 (eight) hours as needed. Patient not taking: Reported on 07/20/2014 01/28/14   Rolland Porter, MD  Vitamin D, Ergocalciferol, (DRISDOL) 50000 UNITS CAPS capsule Take 1 capsule (50,000 Units total) by mouth every 7 (seven) days. Patient not taking: Reported on 08/21/2014 06/26/14   Tresa Garter, MD     Objective:   Filed Vitals:   08/23/14 1037  BP: 161/111  Pulse: 102  Temp: 97.6 F (36.4 C)  Height: 5\' 11"  (1.803 m)  Weight: 260 lb (117.935 kg)  SpO2: 95%    Exam General appearance : Awake, alert, not in any distress. Speech Clear. Not toxic looking HEENT: Atraumatic and Normocephalic, pupils equally reactive to light and accomodation Neck: supple, no JVD. No cervical lymphadenopathy.  Chest:Good air entry bilaterally, no added sounds  CVS: S1 S2 regular, no murmurs.  Abdomen: Bowel sounds present, Non tender and not distended with no gaurding, rigidity or rebound. Extremities: B/L Lower Ext shows no edema, both legs are warm to touch Neurology: Awake alert, and oriented X 3, CN II-XII intact, Non focal Skin:No Rash  Data Review Lab Results  Component Value Date   HGBA1C 5.7* 02/13/2013     Assessment & Plan   CT cervical spine reviewed by me and discussed with patient  IMPRESSION:  Negative for acute fracture or metastatic disease. MRI findings are consistent with degenerative change C4-5 disc space and facet on the left at C3-4.  Multilevel degenerative change. 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  1. Elevated blood pressure  - cloNIDine (CATAPRES) tablet 0.1 mg; Take 1 tablet (0.1 mg total) by mouth once per protocol  2. Cigarette nicotine dependence without complication  The patient was counseled on the dangers of tobacco use, and was advised to quit. Reviewed strategies to maximize success, including removing cigarettes and smoking materials from environment, stress management and support of  family/friends.   3. Uncontrolled hypertension Increase lisinopril-hydrochlorothiazide to 20-25 milligrams tablet by mouth daily from 10-12 0.5 mg - lisinopril-hydrochlorothiazide (PRINZIDE,ZESTORETIC) 20-25 MG per tablet; Take 1 tablet by mouth daily.  Dispense: 90 tablet; Refill: 3  - We have discussed target BP range and blood pressure goal - I have advised patient to check BP regularly and to call us back or report to clinic if the numbers are consistently higher than 140/90  - We discussed the importance of compliance with medical therapy and DASH diet recommended, consequences of uncontrolled hypertension discussed.  - continue current BP medications  4. Chronic pain syndrome  - acetaminophen-codeine (TYLENOL #3) 300-30 MG per tablet; Take 1 tablet by mouth every 8 (eight) hours as needed.  Dispense: 60 tablet; Refill: 0  Patient have been counseled extensively about nutrition and exercise Return in about 4 weeks (around 09/20/2014), or if symptoms worsen or fail to improve, for CBG, Lab/Nurse Visit.  The patient was given clear instructions to go to ER or return to medical  center if symptoms don't improve, worsen or new problems develop. The patient verbalized understanding. The patient was told to call to get lab results if they haven't heard anything in the next week.   This note has been created with Surveyor, quantity. Any transcriptional errors are unintentional.    Angelica Chessman, MD, [place], [place], [place], [place], [place]   08/23/2014, 11:10 AM

## 2014-09-20 ENCOUNTER — Encounter (HOSPITAL_COMMUNITY): Payer: Self-pay | Admitting: *Deleted

## 2014-09-20 ENCOUNTER — Emergency Department (HOSPITAL_COMMUNITY)
Admission: EM | Admit: 2014-09-20 | Discharge: 2014-09-20 | Discharge status: Against Medical Advice | Payer: Medicaid Other | Attending: Emergency Medicine | Admitting: Emergency Medicine

## 2014-09-20 DIAGNOSIS — R1084 Generalized abdominal pain: Secondary | ICD-10-CM | POA: Diagnosis not present

## 2014-09-20 DIAGNOSIS — K429 Umbilical hernia without obstruction or gangrene: Secondary | ICD-10-CM | POA: Insufficient documentation

## 2014-09-20 DIAGNOSIS — R1010 Upper abdominal pain, unspecified: Secondary | ICD-10-CM | POA: Insufficient documentation

## 2014-09-20 DIAGNOSIS — J449 Chronic obstructive pulmonary disease, unspecified: Secondary | ICD-10-CM | POA: Diagnosis not present

## 2014-09-20 DIAGNOSIS — R197 Diarrhea, unspecified: Secondary | ICD-10-CM | POA: Insufficient documentation

## 2014-09-20 DIAGNOSIS — M545 Low back pain: Secondary | ICD-10-CM | POA: Insufficient documentation

## 2014-09-20 DIAGNOSIS — Z720 Tobacco use: Secondary | ICD-10-CM | POA: Diagnosis not present

## 2014-09-20 DIAGNOSIS — I10 Essential (primary) hypertension: Secondary | ICD-10-CM | POA: Diagnosis not present

## 2014-09-20 DIAGNOSIS — Z79899 Other long term (current) drug therapy: Secondary | ICD-10-CM | POA: Diagnosis not present

## 2014-09-20 DIAGNOSIS — I2510 Atherosclerotic heart disease of native coronary artery without angina pectoris: Secondary | ICD-10-CM | POA: Diagnosis not present

## 2014-09-20 DIAGNOSIS — Z880 Allergy status to penicillin: Secondary | ICD-10-CM | POA: Diagnosis not present

## 2014-09-20 DIAGNOSIS — Z8639 Personal history of other endocrine, nutritional and metabolic disease: Secondary | ICD-10-CM | POA: Insufficient documentation

## 2014-09-20 DIAGNOSIS — R112 Nausea with vomiting, unspecified: Secondary | ICD-10-CM | POA: Diagnosis present

## 2014-09-20 LAB — CBC WITH DIFFERENTIAL/PLATELET
Basophils Absolute: 0 10*3/uL (ref 0.0–0.1)
Basophils Relative: 0 % (ref 0–1)
Eosinophils Absolute: 0.1 10*3/uL (ref 0.0–0.7)
Eosinophils Relative: 1 % (ref 0–5)
HCT: 48.2 % (ref 39.0–52.0)
Hemoglobin: 17.6 g/dL — ABNORMAL HIGH (ref 13.0–17.0)
LYMPHS ABS: 2 10*3/uL (ref 0.7–4.0)
Lymphocytes Relative: 14 % (ref 12–46)
MCH: 33.9 pg (ref 26.0–34.0)
MCHC: 36.5 g/dL — AB (ref 30.0–36.0)
MCV: 92.9 fL (ref 78.0–100.0)
Monocytes Absolute: 0.7 10*3/uL (ref 0.1–1.0)
Monocytes Relative: 5 % (ref 3–12)
Neutro Abs: 11.5 10*3/uL — ABNORMAL HIGH (ref 1.7–7.7)
Neutrophils Relative %: 80 % — ABNORMAL HIGH (ref 43–77)
Platelets: 258 10*3/uL (ref 150–400)
RBC: 5.19 MIL/uL (ref 4.22–5.81)
RDW: 12.7 % (ref 11.5–15.5)
WBC: 14.3 10*3/uL — ABNORMAL HIGH (ref 4.0–10.5)

## 2014-09-20 LAB — URINALYSIS, ROUTINE W REFLEX MICROSCOPIC
BILIRUBIN URINE: NEGATIVE
Glucose, UA: NEGATIVE mg/dL
Ketones, ur: NEGATIVE mg/dL
Leukocytes, UA: NEGATIVE
NITRITE: NEGATIVE
PH: 7.5 (ref 5.0–8.0)
PROTEIN: 100 mg/dL — AB
Specific Gravity, Urine: 1.015 (ref 1.005–1.030)
Urobilinogen, UA: 0.2 mg/dL (ref 0.0–1.0)

## 2014-09-20 LAB — COMPREHENSIVE METABOLIC PANEL
ALBUMIN: 4.5 g/dL (ref 3.5–5.0)
ALK PHOS: 75 U/L (ref 38–126)
ALT: 35 U/L (ref 17–63)
ANION GAP: 13 (ref 5–15)
AST: 30 U/L (ref 15–41)
BILIRUBIN TOTAL: 1.1 mg/dL (ref 0.3–1.2)
BUN: 13 mg/dL (ref 6–20)
CO2: 24 mmol/L (ref 22–32)
Calcium: 9.4 mg/dL (ref 8.9–10.3)
Chloride: 99 mmol/L — ABNORMAL LOW (ref 101–111)
Creatinine, Ser: 1.06 mg/dL (ref 0.61–1.24)
GLUCOSE: 117 mg/dL — AB (ref 65–99)
Potassium: 3.4 mmol/L — ABNORMAL LOW (ref 3.5–5.1)
Sodium: 136 mmol/L (ref 135–145)
Total Protein: 8.5 g/dL — ABNORMAL HIGH (ref 6.5–8.1)

## 2014-09-20 LAB — URINE MICROSCOPIC-ADD ON

## 2014-09-20 LAB — LIPASE, BLOOD: Lipase: 19 U/L — ABNORMAL LOW (ref 22–51)

## 2014-09-20 MED ORDER — ONDANSETRON HCL 4 MG/2ML IJ SOLN
4.0000 mg | Freq: Once | INTRAMUSCULAR | Status: AC
  Administered 2014-09-20: 4 mg via INTRAVENOUS
  Filled 2014-09-20: qty 2

## 2014-09-20 MED ORDER — SODIUM CHLORIDE 0.9 % IV BOLUS (SEPSIS)
1000.0000 mL | Freq: Once | INTRAVENOUS | Status: AC
  Administered 2014-09-20: 1000 mL via INTRAVENOUS

## 2014-09-20 NOTE — ED Notes (Addendum)
NVD since this am, diarrhea has eased.  No fever, "I'm freezing"  Alert,  abd pain

## 2014-09-20 NOTE — ED Notes (Signed)
Patient resting in bed comfortably

## 2014-09-20 NOTE — ED Provider Notes (Signed)
CSN: [id number]     Arrival date & time 09/20/14  1920 History  This chart was scribed for Oscar Greek, MD by Eustaquio Maize, ED Scribe. This patient was seen in room APA04/APA04 and the patient's care was started at 8:07 PM.  Chief Complaint  Patient presents with  . Emesis   The history is provided by the patient and the spouse. No language interpreter was used.    HPI Comments: Oscar Hall is a 54 y.o. male who presents to the Emergency Department complaining of nausea and vomiting x 16 hours. Pt is also complaining of diarrhea, upper abdominal pain, and lower back pain. He has been unable to eat or drink since onset. Wife mentions that diarrhea was black and tarry looking. He denies fever, chills, hematemesis, hematochezia, or any other symptoms.   Past Medical History  Diagnosis Date  . PTSD (post-traumatic stress disorder)   . Coronary artery disease   . Hypercholesterolemia   . Fatty liver   . Hypercholesteremia   . Hypertension   . COPD (chronic obstructive pulmonary disease)    Past Surgical History  Procedure Laterality Date  . Throat surgery      traumatic injury  . Hand surgery    . Knee surgery    . Shoulder surgery     Family History  Problem Relation Age of Onset  . Alcohol abuse Father   . Anxiety disorder Father   . Depression Father   . Coronary artery disease Father   . Alcohol abuse Brother   . Coronary artery disease Brother   . Schizophrenia Neg Hx   . Diabetes Mellitus II Neg Hx   . Drug abuse Brother   . Depression Brother 37    Commited suicide   . Cancer Mother 10    ovarian   History  Substance Use Topics  . Smoking status: Current Every Day Smoker -- 0.50 packs/day for 30 years    Types: Cigarettes    Start date: 03/24/1983  . Smokeless tobacco: Not on file     Comment: Quit for 18 months 4 years ago  . Alcohol Use: No     Comment: Quit drinking. 2-3 fifths, binge drinking.    Review of Systems  Constitutional: Negative  for fever.  Gastrointestinal: Positive for nausea, vomiting, abdominal pain and diarrhea. Negative for blood in stool.  Musculoskeletal: Positive for back pain.  All other systems reviewed and are negative.  Allergies  Penicillins; Vistaril; Sulfa antibiotics; Lithium; Tramadol; Celebrex; Neurontin; Soma; and Toradol  Home Medications   Prior to Admission medications   Medication Sig Start Date End Date Taking? Authorizing Provider  albuterol (PROVENTIL HFA;VENTOLIN HFA) 108 (90 BASE) MCG/ACT inhaler Inhale 2 puffs into the lungs every 6 (six) hours as needed for wheezing or shortness of breath. 11/30/13  Yes Tresa Garter, MD  amitriptyline (ELAVIL) 50 MG tablet Take 1 tablet (50 mg total) by mouth at bedtime. 06/18/14  Yes Olugbemiga Essie Christine, MD  busPIRone (BUSPAR) 10 MG tablet Take 1 tablet (10 mg total) by mouth 2 (two) times daily. 08/21/14 08/21/15 Yes Merian Capron, MD  ezetimibe-simvastatin (VYTORIN) 10-10 MG per tablet Take 1 tablet by mouth at bedtime. 12/12/13  Yes Tresa Garter, MD  lamoTRIgine (LAMICTAL) 25 MG tablet Take 2 tablets. Total of 50mg . Do not increase dose higher then 50mg  a day. Patient taking differently: Take 50 mg by mouth daily.  08/21/14  Yes Merian Capron, MD  lisinopril-hydrochlorothiazide (PRINZIDE,ZESTORETIC) 20-25 MG per tablet  Take 1 tablet by mouth daily. 08/23/14  Yes Tresa Garter, MD  acetaminophen-codeine (TYLENOL #3) 300-30 MG per tablet Take 1 tablet by mouth every 8 (eight) hours as needed. Patient not taking: Reported on 09/20/2014 08/23/14   Tresa Garter, MD  HYDROcodone-acetaminophen (NORCO/VICODIN) 5-325 MG per tablet Take 1-2 tablets by mouth every 6 (six) hours as needed. Patient not taking: Reported on 08/21/2014 07/20/14   Fredia Sorrow, MD  ondansetron (ZOFRAN) 4 MG tablet Take 1 tablet (4 mg total) by mouth every 8 (eight) hours as needed. Patient not taking: Reported on 07/20/2014 01/28/14   Rolland Porter, MD  Vitamin D,  Ergocalciferol, (DRISDOL) 50000 UNITS CAPS capsule Take 1 capsule (50,000 Units total) by mouth every 7 (seven) days. Patient not taking: Reported on 08/21/2014 06/26/14   Tresa Garter, MD   Triage Vitals: BP 181/103 mmHg  Pulse 90  Temp(Src) 97.4 F (36.3 C) (Oral)  Resp 22  Ht 5\' 11"  (1.803 m)  Wt 265 lb (120.203 kg)  BMI 36.98 kg/m2  SpO2 100%   Physical Exam  Constitutional: He is oriented to person, place, and time. He appears well-developed and well-nourished. No distress.  HENT:  Head: Normocephalic and atraumatic.  Right Ear: Hearing normal.  Left Ear: Hearing normal.  Nose: Nose normal.  Mouth/Throat: Oropharynx is clear and moist and mucous membranes are normal.  Eyes: Conjunctivae and EOM are normal. Pupils are equal, round, and reactive to light.  Neck: Normal range of motion. Neck supple.  Cardiovascular: Regular rhythm, S1 normal and S2 normal.  Exam reveals no gallop and no friction rub.   No murmur heard. Pulmonary/Chest: Effort normal and breath sounds normal. No respiratory distress. He exhibits no tenderness.  Abdominal: Soft. Normal appearance and bowel sounds are normal. There is no hepatosplenomegaly. There is generalized tenderness. There is no rebound, no guarding, no tenderness at McBurney's point and negative Murphy's sign. No hernia.  Reducible umbilical hernia   Musculoskeletal: Normal range of motion.  Neurological: He is alert and oriented to person, place, and time. He has normal strength. No cranial nerve deficit or sensory deficit. Coordination normal. GCS eye subscore is 4. GCS verbal subscore is 5. GCS motor subscore is 6.  Skin: Skin is warm, dry and intact. No rash noted. No cyanosis.  Psychiatric: He has a normal mood and affect. His speech is normal and behavior is normal. Thought content normal.  Nursing note and vitals reviewed.   ED Course  Procedures (including critical care time)  DIAGNOSTIC STUDIES: Oxygen Saturation is 100% on  RA, normal by my interpretation.    COORDINATION OF CARE: 8:10 PM-Discussed treatment plan which includes Zofran, CBC, CMP, Lipase, UA with pt at bedside and pt agreed to plan.   Labs Review Labs Reviewed  CBC WITH DIFFERENTIAL/PLATELET - Abnormal; Notable for the following:    WBC 14.3 (*)    Hemoglobin 17.6 (*)    MCHC 36.5 (*)    Neutrophils Relative % 80 (*)    Neutro Abs 11.5 (*)    All other components within normal limits  COMPREHENSIVE METABOLIC PANEL - Abnormal; Notable for the following:    Potassium 3.4 (*)    Chloride 99 (*)    Glucose, Bld 117 (*)    Total Protein 8.5 (*)    All other components within normal limits  LIPASE, BLOOD - Abnormal; Notable for the following:    Lipase 19 (*)    All other components within normal limits  URINALYSIS, ROUTINE  W REFLEX MICROSCOPIC (NOT AT [facility]) - Abnormal; Notable for the following:    Hgb urine dipstick MODERATE (*)    Protein, ur 100 (*)    All other components within normal limits  URINE MICROSCOPIC-ADD ON    Imaging Review No results found.   EKG Interpretation None      MDM   Final diagnoses:  Abdominal pain, generalized   Patient presents to the ER for evaluation of nausea, vomiting, diarrhea and abdominal pain. Patient reports that he has been experiencing these episodes intermittently every 4 months or so. His abdominal exam was benign on examination. Lab work has been unremarkable. Repeat examination revealed no change, patient does not have any guarding or rebound. Patient was told that he would be given pain medication and will be given prescription medication use at home. When this was not performed quickly enough for him he became belligerent, screaming, yelling and cursing. He began to threaten nurses, refused to allow [place] to take out his IV, had his wife take his IV out and was escorted out of the ER.  I personally performed the services described in this documentation, which was scribed in my presence.  The recorded information has been reviewed and is accurate.       Oscar Greek, MD 09/20/14 [phone]

## 2014-09-20 NOTE — ED Notes (Signed)
Patient cursing, states that we are messing with the wrong one. States that he has been laying back here for 2 hours in pain and the nurse will not come in the room because he is already cursed her out. Camera operator, patient's nurse, AC, and security notified.

## 2014-09-20 NOTE — ED Notes (Signed)
Patient cursing at this nurse and yelling while attempting to find vein for intravenous access.

## 2014-09-20 NOTE — ED Notes (Addendum)
Patient came to door and started talking loudly, cursing. Stating that he is in pain and the doctor stated he would give him something. Patient stated, "I know I cursed the nurse out earlier, but I am hurting. I need pain medication. You are messing with the wrong one."

## 2014-10-02 ENCOUNTER — Encounter (HOSPITAL_COMMUNITY): Payer: Self-pay | Admitting: Psychiatry

## 2014-10-02 ENCOUNTER — Ambulatory Visit (INDEPENDENT_AMBULATORY_CARE_PROVIDER_SITE_OTHER): Payer: Medicaid Other | Admitting: Psychiatry

## 2014-10-02 VITALS — BP 142/84 | HR 108 | Ht 71.0 in | Wt 263.0 lb

## 2014-10-02 DIAGNOSIS — F329 Major depressive disorder, single episode, unspecified: Secondary | ICD-10-CM

## 2014-10-02 DIAGNOSIS — M25512 Pain in left shoulder: Secondary | ICD-10-CM

## 2014-10-02 DIAGNOSIS — F1021 Alcohol dependence, in remission: Secondary | ICD-10-CM | POA: Diagnosis not present

## 2014-10-02 DIAGNOSIS — F17200 Nicotine dependence, unspecified, uncomplicated: Secondary | ICD-10-CM | POA: Diagnosis not present

## 2014-10-02 DIAGNOSIS — F3113 Bipolar disorder, current episode manic without psychotic features, severe: Secondary | ICD-10-CM

## 2014-10-02 DIAGNOSIS — F4310 Post-traumatic stress disorder, unspecified: Secondary | ICD-10-CM

## 2014-10-02 MED ORDER — BUSPIRONE HCL 10 MG PO TABS: 10.0000 mg | ORAL_TABLET | Freq: Two times a day (BID) | ORAL | Status: DC

## 2014-10-02 MED ORDER — LAMOTRIGINE 25 MG PO TABS: 50.0000 mg | ORAL_TABLET | Freq: Every day | ORAL | Status: DC

## 2014-10-02 NOTE — Progress Notes (Signed)
Patient ID: Oscar Hall, male   DOB: May 30, 1960, 54 y.o.   MRN: [id number] [place] Follow-up Outpatient Visit  Oscar Hall [id number] 54 y.o.  10/02/2014  Chief Complaint: depression and follow up.      History of Present Illness:    [name] is a 54 y/o male with a past psychiatric history significant for Bipolar I Disorder, most recent episode manic, severe, without psychoatic features, rule out Post traumatic stress disorder. Alcohol use disorder in sustained remission. Chronic pain. The patient is referred for psychiatric services for medication management.   lamictal at 20m dose works or and no rash. He is  back on that dose.  Describes mood to be somewhat better less depressed. There is no rash. He is having vomiting at times went [place] [employer] but could not tolerate large crowd. He will talk to his sPsychologist, sport and exercise In regarding to his anxiety related PTSD still has fear of being around people but he was able to "dine out with his sister once last week. This is an improvement Mood: upset at times but no psychosis.  Medical complexity/ Data; no rash today. Also mood  exacerbated by his shoulder and joint pains No relapse on alcohol for more then 3 years.    . Quality:  [place] [facility] has [place]. Sister lives next to him but sick so he has to take care of her.  Anxiety has improved on buspirone but still does not want anybody at his back and occassional flashbacks from the past.  Has Applied for disability  . Severity:  Depression: 6/10 (0=Very depressed; 5=Neutral; 10=Very Happy)  Anxiety- 6/10 (0=no anxiety; 5= moderate/tolerable anxiety; 10= panic attacks)  . Duration: Since the age of 89 . Timing: Worse midday 10 AM to 4 Pm  . Context: Being in front of people.  . Modifying factors: Dog and medictions Group therapy.  . Associated signs and symptoms:   Past Medical History  Diagnosis Date  . PTSD (post-traumatic stress disorder)   . Coronary artery  disease   . Hypercholesterolemia   . Fatty liver   . Hypercholesteremia   . Hypertension   . COPD (chronic obstructive pulmonary disease)    Family History  Problem Relation Age of Onset  . Alcohol abuse Father   . Anxiety disorder Father   . Depression Father   . Coronary artery disease Father   . Alcohol abuse Brother   . Coronary artery disease Brother   . Schizophrenia Neg Hx   . Diabetes Mellitus II Neg Hx   . Drug abuse Brother   . Depression Brother 376   Commited suicide   . Cancer Mother 454   ovarian    Outpatient Encounter Prescriptions as of 10/02/2014  Medication Sig  . albuterol (PROVENTIL HFA;VENTOLIN HFA) 108 (90 BASE) MCG/ACT inhaler Inhale 2 puffs into the lungs every 6 (six) hours as needed for wheezing or shortness of breath.  .Marland Kitchenamitriptyline (ELAVIL) 50 MG tablet Take 1 tablet (50 mg total) by mouth at bedtime.  . busPIRone (BUSPAR) 10 MG tablet Take 1 tablet (10 mg total) by mouth 2 (two) times daily.  .Marland Kitchenezetimibe-simvastatin (VYTORIN) 10-10 MG per tablet Take 1 tablet by mouth at bedtime.  . lamoTRIgine (LAMICTAL) 25 MG tablet Take 2 tablets (50 mg total) by mouth daily.  .Marland Kitchenlisinopril-hydrochlorothiazide (PRINZIDE,ZESTORETIC) 20-25 MG per tablet Take 1 tablet by mouth daily.  . [DISCONTINUED] busPIRone (BUSPAR) 10 MG tablet Take 1 tablet (10 mg  total) by mouth 2 (two) times daily.  . [DISCONTINUED] lamoTRIgine (LAMICTAL) 25 MG tablet Take 2 tablets. Total of $Remove'50mg'WqewIfl$ . Do not increase dose higher then $RemoveBef'50mg'nQRWlVuJmR$  a day. (Patient taking differently: Take 50 mg by mouth daily. )  . acetaminophen-codeine (TYLENOL #3) 300-30 MG per tablet Take 1 tablet by mouth every 8 (eight) hours as needed. (Patient not taking: Reported on 09/20/2014)  . HYDROcodone-acetaminophen (NORCO/VICODIN) 5-325 MG per tablet Take 1-2 tablets by mouth every 6 (six) hours as needed. (Patient not taking: Reported on 08/21/2014)  . ondansetron (ZOFRAN) 4 MG tablet Take 1 tablet (4 mg total) by mouth  every 8 (eight) hours as needed. (Patient not taking: Reported on 07/20/2014)  . Vitamin D, Ergocalciferol, (DRISDOL) 50000 UNITS CAPS capsule Take 1 capsule (50,000 Units total) by mouth every 7 (seven) days. (Patient not taking: Reported on 08/21/2014)   No facility-administered encounter medications on file as of 10/02/2014.    Recent Results (from the past 2160 hour(s))  CMP and Liver     Status: None   Collection Time: 07/16/14  9:42 AM  Result Value Ref Range   Sodium 138 135 - 145 mEq/L   Potassium 5.2 3.5 - 5.3 mEq/L   Chloride 100 96 - 112 mEq/L   CO2 27 19 - 32 mEq/L   Glucose, Bld 99 70 - 99 mg/dL   BUN 12 6 - 23 mg/dL   Creat 1.05 0.50 - 1.35 mg/dL   Total Bilirubin 0.5 0.2 - 1.2 mg/dL   Alkaline Phosphatase 75 39 - 117 U/L   AST 28 0 - 37 U/L   ALT 38 0 - 53 U/L   Total Protein 7.7 6.0 - 8.3 g/dL   Albumin 4.2 3.5 - 5.2 g/dL   Calcium 10.1 8.4 - 10.5 mg/dL   Bilirubin, Direct 0.1 0.0 - 0.3 mg/dL   Indirect Bilirubin 0.4 0.2 - 1.2 mg/dL  Comprehensive metabolic panel     Status: Abnormal   Collection Time: 07/20/14  1:30 PM  Result Value Ref Range   Sodium 132 (L) 135 - 145 mmol/L   Potassium 3.7 3.5 - 5.1 mmol/L   Chloride 98 96 - 112 mmol/L   CO2 21 19 - 32 mmol/L   Glucose, Bld 117 (H) 70 - 99 mg/dL   BUN 18 6 - 23 mg/dL   Creatinine, Ser 1.11 0.50 - 1.35 mg/dL   Calcium 9.8 8.4 - 10.5 mg/dL   Total Protein 8.5 (H) 6.0 - 8.3 g/dL   Albumin 4.8 3.5 - 5.2 g/dL   AST 40 (H) 0 - 37 U/L   ALT 53 0 - 53 U/L   Alkaline Phosphatase 83 39 - 117 U/L   Total Bilirubin 0.9 0.3 - 1.2 mg/dL   GFR calc non Af Amer 75 (L) >90 mL/min   GFR calc Af Amer 87 (L) >90 mL/min    Comment: (NOTE) The eGFR has been calculated using the CKD EPI equation. This calculation has not been validated in all clinical situations. eGFR's persistently <90 mL/min signify possible Chronic Kidney Disease.    Anion gap 13 5 - 15  Lipase, blood     Status: None   Collection Time: 07/20/14  1:30  PM  Result Value Ref Range   Lipase 30 11 - 59 U/L  CBC with Differential/Platelet     Status: Abnormal   Collection Time: 07/20/14  1:30 PM  Result Value Ref Range   WBC 14.7 (H) 4.0 - 10.5 K/uL   RBC 5.30  4.22 - 5.81 MIL/uL   Hemoglobin 18.1 (H) 13.0 - 17.0 g/dL   HCT 49.6 39.0 - 52.0 %   MCV 93.6 78.0 - 100.0 fL   MCH 34.2 (H) 26.0 - 34.0 pg   MCHC 36.5 (H) 30.0 - 36.0 g/dL   RDW 12.6 11.5 - 15.5 %   Platelets 280 150 - 400 K/uL   Neutrophils Relative % 79 (H) 43 - 77 %   Neutro Abs 11.5 (H) 1.7 - 7.7 K/uL   Lymphocytes Relative 16 12 - 46 %   Lymphs Abs 2.4 0.7 - 4.0 K/uL   Monocytes Relative 4 3 - 12 %   Monocytes Absolute 0.7 0.1 - 1.0 K/uL   Eosinophils Relative 1 0 - 5 %   Eosinophils Absolute 0.1 0.0 - 0.7 K/uL   Basophils Relative 0 0 - 1 %   Basophils Absolute 0.0 0.0 - 0.1 K/uL  Troponin I     Status: None   Collection Time: 07/20/14  1:30 PM  Result Value Ref Range   Troponin I <0.03 <0.031 ng/mL    Comment:        NO INDICATION OF MYOCARDIAL INJURY.   I-Stat Troponin, ED (not at [facility])     Status: None   Collection Time: 07/20/14  2:51 PM  Result Value Ref Range   Troponin i, poc 0.00 0.00 - 0.08 ng/mL   Comment 3            Comment: Due to the release kinetics of cTnI, a negative result within the first hours of the onset of symptoms does not rule out myocardial infarction with certainty. If myocardial infarction is still suspected, repeat the test at appropriate intervals.   PSA     Status: None   Collection Time: 08/06/14  9:41 AM  Result Value Ref Range   PSA 0.26 <=4.00 ng/mL    Comment: Test Methodology: ECLIA PSA (Electrochemiluminescence Immunoassay)   For PSA values from 2.5-4.0, particularly in younger men <24 years old, the AUA and NCCN suggest testing for % Free PSA (3515) and evaluation of the rate of increase in PSA (PSA velocity).   COMPLETE METABOLIC PANEL WITH GFR     Status: Abnormal   Collection Time: 08/06/14  9:41 AM  Result  Value Ref Range   Sodium 138 135 - 145 mEq/L   Potassium 4.9 3.5 - 5.3 mEq/L   Chloride 101 96 - 112 mEq/L   CO2 28 19 - 32 mEq/L   Glucose, Bld 129 (H) 70 - 99 mg/dL   BUN 12 6 - 23 mg/dL   Creat 0.97 0.50 - 1.35 mg/dL   Total Bilirubin 0.3 0.2 - 1.2 mg/dL   Alkaline Phosphatase 78 39 - 117 U/L   AST 24 0 - 37 U/L   ALT 31 0 - 53 U/L   Total Protein 6.7 6.0 - 8.3 g/dL   Albumin 3.6 3.5 - 5.2 g/dL   Calcium 9.8 8.4 - 10.5 mg/dL   GFR, Est African American >89 mL/min   GFR, Est Non African American 89 mL/min    Comment:   The estimated GFR is a calculation valid for adults (>=55 years old) that uses the CKD-EPI algorithm to adjust for age and sex. It is   not to be used for children, pregnant women, hospitalized patients,    patients on dialysis, or with rapidly changing kidney function. According to the NKDEP, eGFR >89 is normal, 60-89 shows mild impairment, 30-59 shows moderate impairment, 15-29  shows severe impairment and <15 is ESRD.     CBC with Differential/Platelet     Status: Abnormal   Collection Time: 09/20/14  8:32 PM  Result Value Ref Range   WBC 14.3 (H) 4.0 - 10.5 K/uL   RBC 5.19 4.22 - 5.81 MIL/uL   Hemoglobin 17.6 (H) 13.0 - 17.0 g/dL   HCT 48.2 39.0 - 52.0 %   MCV 92.9 78.0 - 100.0 fL   MCH 33.9 26.0 - 34.0 pg   MCHC 36.5 (H) 30.0 - 36.0 g/dL   RDW 12.7 11.5 - 15.5 %   Platelets 258 150 - 400 K/uL   Neutrophils Relative % 80 (H) 43 - 77 %   Neutro Abs 11.5 (H) 1.7 - 7.7 K/uL   Lymphocytes Relative 14 12 - 46 %   Lymphs Abs 2.0 0.7 - 4.0 K/uL   Monocytes Relative 5 3 - 12 %   Monocytes Absolute 0.7 0.1 - 1.0 K/uL   Eosinophils Relative 1 0 - 5 %   Eosinophils Absolute 0.1 0.0 - 0.7 K/uL   Basophils Relative 0 0 - 1 %   Basophils Absolute 0.0 0.0 - 0.1 K/uL  Comprehensive metabolic panel     Status: Abnormal   Collection Time: 09/20/14  8:32 PM  Result Value Ref Range   Sodium 136 135 - 145 mmol/L   Potassium 3.4 (L) 3.5 - 5.1 mmol/L   Chloride 99  (L) 101 - 111 mmol/L   CO2 24 22 - 32 mmol/L   Glucose, Bld 117 (H) 65 - 99 mg/dL   BUN 13 6 - 20 mg/dL   Creatinine, Ser 1.06 0.61 - 1.24 mg/dL   Calcium 9.4 8.9 - 10.3 mg/dL   Total Protein 8.5 (H) 6.5 - 8.1 g/dL   Albumin 4.5 3.5 - 5.0 g/dL   AST 30 15 - 41 U/L   ALT 35 17 - 63 U/L   Alkaline Phosphatase 75 38 - 126 U/L   Total Bilirubin 1.1 0.3 - 1.2 mg/dL   GFR calc non Af Amer >60 >60 mL/min   GFR calc Af Amer >60 >60 mL/min    Comment: (NOTE) The eGFR has been calculated using the CKD EPI equation. This calculation has not been validated in all clinical situations. eGFR's persistently <60 mL/min signify possible Chronic Kidney Disease.    Anion gap 13 5 - 15  Lipase, blood     Status: Abnormal   Collection Time: 09/20/14  8:32 PM  Result Value Ref Range   Lipase 19 (L) 22 - 51 U/L  Urinalysis, Routine w reflex microscopic (not at [facility])     Status: Abnormal   Collection Time: 09/20/14  8:52 PM  Result Value Ref Range   Color, Urine YELLOW YELLOW   APPearance CLEAR CLEAR   Specific Gravity, Urine 1.015 1.005 - 1.030   pH 7.5 5.0 - 8.0   Glucose, UA NEGATIVE NEGATIVE mg/dL   Hgb urine dipstick MODERATE (A) NEGATIVE   Bilirubin Urine NEGATIVE NEGATIVE   Ketones, ur NEGATIVE NEGATIVE mg/dL   Protein, ur 100 (A) NEGATIVE mg/dL   Urobilinogen, UA 0.2 0.0 - 1.0 mg/dL   Nitrite NEGATIVE NEGATIVE   Leukocytes, UA NEGATIVE NEGATIVE  Urine microscopic-add on     Status: None   Collection Time: 09/20/14  8:52 PM  Result Value Ref Range   Squamous Epithelial / LPF RARE RARE   WBC, UA 0-2 <3 WBC/hpf   RBC / HPF 7-10 <3 RBC/hpf   Bacteria, UA  RARE RARE    BP 142/84 mmHg  Pulse 108  Ht _0  (1.803 m)  Wt 263 lb (119.296 kg)  BMI 36.70 kg/m2  SpO2 96%   Review of Systems  Skin: Negative for rash.  Neurological: Negative for tingling, tremors and headaches.  Psychiatric/Behavioral: Positive for depression. Negative for substance abuse.    Mental Status Examination   Appearance: casual Alert: Yes Attention: fair  Cooperative: Yes Eye Contact: Good Speech: normal tone Psychomotor Activity: Normal Memory/Concentration: reasonable Oriented: person, place and time/date Mood: Dysphoric with some irritability Affect: Congruent Thought Processes and Associations: Coherent Fund of Knowledge: Fair Thought Content: Suicidal ideation and Homicidal ideation were denied. Insight: Fair Judgement: Fair  Diagnosis: Maj. depressive disorder. Possible bipolar disorder depressed type without psychotic features. Chronic pain. PTSD tp be  Ruled out. Alcohol use disorder in sustained remission.  Cluster B traits per history.  Treatment Plan:   Continue lamictal 50-mg  For mood symptoms. Do not increase dose Anxiety and PTSD: continue 63m bid. He is not ready to quit smoking. Info and counselling provided.  Chronic pain dealt with other providers it does effect his mood and sleep.  Discussed alcohol relapse no craving for now.  Nicotine use; He has cut down and counselling given. He is working on quitting in next month.  Medication Side effects, benefits and risks reviewed/discussed with Patient. Time given for patient to respond and asks questions regarding the Diagnosis and Medications. Safety concerns and to report to ER if suicidal or call 911. Relevant Medications refilled or called in to pharmacy. Discussed weight maintenance and Sleep Hygiene. Follow up with Primary care provider in regards to Medical conditions. Recommend compliance with medications and follow up office appointments. Discussed to avail opportunity to consider or/and continue Individual therapy with Counselor. Greater than 50% of time was spend in counseling and coordination of care with the patient.  Schedule for Follow up visit in 10  weeks or call in earlier as necessary.  AMerian Capron MD 10/02/2014

## 2014-10-05 ENCOUNTER — Ambulatory Visit: Payer: Medicaid Other | Attending: Internal Medicine | Admitting: *Deleted

## 2014-10-05 VITALS — BP 133/75 | HR 86 | Temp 98.2°F | Resp 20 | Ht 71.0 in | Wt 259.6 lb

## 2014-10-05 DIAGNOSIS — IMO0001 Reserved for inherently not codable concepts without codable children: Secondary | ICD-10-CM

## 2014-10-05 DIAGNOSIS — J449 Chronic obstructive pulmonary disease, unspecified: Secondary | ICD-10-CM | POA: Diagnosis not present

## 2014-10-05 DIAGNOSIS — R030 Elevated blood-pressure reading, without diagnosis of hypertension: Secondary | ICD-10-CM

## 2014-10-05 MED ORDER — ALBUTEROL SULFATE HFA 108 (90 BASE) MCG/ACT IN AERS: 2.0000 | INHALATION_SPRAY | Freq: Four times a day (QID) | RESPIRATORY_TRACT | Status: DC | PRN

## 2014-10-05 NOTE — Progress Notes (Signed)
Patient presents for BP check after increasing lisinopril-HCTZ to 20-25 Med list reviewed; states taking all meds as directed Patient is not adding salt to foods or cooking with salt and using Mrs Deliah Boston as alternative to salt. Encouraged patient to choose foods with 5% or less of daily value for sodium. States he has cut out soup, meets mostly fresh foods, chicken, fish. Discussed walking 30 minutes per day for exercise Patient denies headaches, blurred vision, chest pain  Positive for SHOB on hot, muggy days. Uses albuterol inhaler and stays in [facility]  Filed Vitals:   10/05/14 1132  BP: 133/75  Pulse: 86  Temp: 98.2 F (36.8 C)  Resp: 20     Patient advised to call for med refills at least 7 days before running out so as not to go without.  Patient aware that he is to f/u with PCP 3 months from last visit (Due 11/23/14)  Patient given literature on DASH Eating Plan

## 2014-10-05 NOTE — Patient Instructions (Signed)
DASH Eating Plan °DASH stands for "Dietary Approaches to Stop Hypertension." The DASH eating plan is a healthy eating plan that has been shown to reduce high blood pressure (hypertension). Additional health benefits may include reducing the risk of type 2 diabetes mellitus, heart disease, and stroke. The DASH eating plan may also help with weight loss. °WHAT DO I NEED TO KNOW ABOUT THE DASH EATING PLAN? °For the DASH eating plan, you will follow these general guidelines: °· Choose foods with a percent daily value for sodium of less than 5% (as listed on the food label). °· Use salt-free seasonings or herbs instead of table salt or sea salt. °· Check with your health care provider or pharmacist before using salt substitutes. °· Eat lower-sodium products, often labeled as "lower sodium" or "no salt added." °· Eat fresh foods. °· Eat more vegetables, fruits, and low-fat dairy products. °· Choose whole grains. Look for the word "whole" as the first word in the ingredient list. °· Choose fish and skinless chicken or turkey more often than red meat. Limit fish, poultry, and meat to 6 oz (170 g) each day. °· Limit sweets, desserts, sugars, and sugary drinks. °· Choose heart-healthy fats. °· Limit cheese to 1 oz (28 g) per day. °· Eat more home-cooked food and less restaurant, buffet, and fast food. °· Limit fried foods. °· Cook foods using methods other than frying. °· Limit canned vegetables. If you do use them, rinse them well to decrease the sodium. °· When eating at a restaurant, ask that your food be prepared with less salt, or no salt if possible. °WHAT FOODS CAN I EAT? °Seek help from a dietitian for individual calorie needs. °Grains °Whole grain or whole wheat bread. Brown rice. Whole grain or whole wheat pasta. Quinoa, bulgur, and whole grain cereals. Low-sodium cereals. Corn or whole wheat flour tortillas. Whole grain cornbread. Whole grain crackers. Low-sodium crackers. °Vegetables °Fresh or frozen vegetables  (raw, steamed, roasted, or grilled). Low-sodium or reduced-sodium tomato and vegetable juices. Low-sodium or reduced-sodium tomato sauce and paste. Low-sodium or reduced-sodium canned vegetables.  °Fruits °All fresh, canned (in natural juice), or frozen fruits. °Meat and Other Protein Products °Ground beef (85% or leaner), grass-fed beef, or beef trimmed of fat. Skinless chicken or turkey. Ground chicken or turkey. Pork trimmed of fat. All fish and seafood. Eggs. Dried beans, peas, or lentils. Unsalted nuts and seeds. Unsalted canned beans. °Dairy °Low-fat dairy products, such as skim or 1% milk, 2% or reduced-fat cheeses, low-fat ricotta or cottage cheese, or plain low-fat yogurt. Low-sodium or reduced-sodium cheeses. °Fats and Oils °Tub margarines without trans fats. Light or reduced-fat mayonnaise and salad dressings (reduced sodium). Avocado. Safflower, olive, or canola oils. Natural peanut or almond butter. °Other °Unsalted popcorn and pretzels. °The items listed above may not be a complete list of recommended foods or beverages. Contact your dietitian for more options. °WHAT FOODS ARE NOT RECOMMENDED? °Grains °White bread. White pasta. White rice. Refined cornbread. Bagels and croissants. Crackers that contain trans fat. °Vegetables °Creamed or fried vegetables. Vegetables in a cheese sauce. Regular canned vegetables. Regular canned tomato sauce and paste. Regular tomato and vegetable juices. °Fruits °Dried fruits. Canned fruit in light or heavy syrup. Fruit juice. °Meat and Other Protein Products °Fatty cuts of meat. Ribs, chicken wings, bacon, sausage, bologna, salami, chitterlings, fatback, hot dogs, bratwurst, and packaged luncheon meats. Salted nuts and seeds. Canned beans with salt. °Dairy °Whole or 2% milk, cream, half-and-half, and cream cheese. Whole-fat or sweetened yogurt. Full-fat   cheeses or blue cheese. Nondairy creamers and whipped toppings. Processed cheese, cheese spreads, or cheese  curds. °Condiments °Onion and garlic salt, seasoned salt, table salt, and sea salt. Canned and packaged gravies. Worcestershire sauce. Tartar sauce. Barbecue sauce. Teriyaki sauce. Soy sauce, including reduced sodium. Steak sauce. Fish sauce. Oyster sauce. Cocktail sauce. Horseradish. Ketchup and mustard. Meat flavorings and tenderizers. Bouillon cubes. Hot sauce. Tabasco sauce. Marinades. Taco seasonings. Relishes. °Fats and Oils °Butter, stick margarine, lard, shortening, ghee, and bacon fat. Coconut, palm kernel, or palm oils. Regular salad dressings. °Other °Pickles and olives. Salted popcorn and pretzels. °The items listed above may not be a complete list of foods and beverages to avoid. Contact your dietitian for more information. °WHERE CAN I FIND MORE INFORMATION? °National [employer], [employer], [employer] Institute: www.nhlbi.nih.gov/health/health-topics/topics/dash/ °Document Released: 02/26/2011 Document Revised: 07/24/2013 Document Reviewed: 01/11/2013 °ExitCare® Patient Information ©2015 [employer]. This information is not intended to replace advice given to you by your health care provider. Make sure you discuss any questions you have with your health care provider. ° °

## 2014-11-02 ENCOUNTER — Ambulatory Visit: Payer: Self-pay

## 2014-11-15 ENCOUNTER — Ambulatory Visit: Payer: Self-pay

## 2014-11-16 ENCOUNTER — Other Ambulatory Visit: Payer: Self-pay | Admitting: Internal Medicine

## 2014-12-03 ENCOUNTER — Ambulatory Visit (HOSPITAL_COMMUNITY): Payer: Self-pay | Admitting: Psychiatry

## 2014-12-04 ENCOUNTER — Ambulatory Visit (HOSPITAL_COMMUNITY): Payer: Self-pay | Admitting: Psychiatry

## 2014-12-07 ENCOUNTER — Ambulatory Visit (INDEPENDENT_AMBULATORY_CARE_PROVIDER_SITE_OTHER): Payer: Medicaid Other | Admitting: Psychiatry

## 2014-12-07 ENCOUNTER — Encounter (HOSPITAL_COMMUNITY): Payer: Self-pay | Admitting: Psychiatry

## 2014-12-07 VITALS — BP 124/74 | HR 84 | Ht 71.0 in | Wt 256.0 lb

## 2014-12-07 DIAGNOSIS — F4310 Post-traumatic stress disorder, unspecified: Secondary | ICD-10-CM

## 2014-12-07 DIAGNOSIS — F3113 Bipolar disorder, current episode manic without psychotic features, severe: Secondary | ICD-10-CM | POA: Diagnosis not present

## 2014-12-07 DIAGNOSIS — G894 Chronic pain syndrome: Secondary | ICD-10-CM

## 2014-12-07 DIAGNOSIS — F17200 Nicotine dependence, unspecified, uncomplicated: Secondary | ICD-10-CM

## 2014-12-07 MED ORDER — LAMOTRIGINE 25 MG PO TABS: 50.0000 mg | ORAL_TABLET | Freq: Every day | ORAL | Status: DC

## 2014-12-07 MED ORDER — BUSPIRONE HCL 10 MG PO TABS: 10.0000 mg | ORAL_TABLET | Freq: Two times a day (BID) | ORAL | Status: DC

## 2014-12-07 MED ORDER — AMITRIPTYLINE HCL 50 MG PO TABS: 50.0000 mg | ORAL_TABLET | Freq: Every day | ORAL | Status: DC

## 2014-12-07 NOTE — Progress Notes (Signed)
Patient ID: Oscar Hall, male   DOB: 04-21-1960, 54 y.o.   MRN: [id number] [place] Follow-up Outpatient Visit  Oscar Hall [id number] 54 y.o.  12/07/2014  Chief Complaint: depression and follow up.      History of Present Illness:    Oscar Hall is a 54 y/o male with a past psychiatric history significant for Bipolar I Disorder, most recent episode manic, severe, without psychoatic features, rule out Post traumatic stress disorder. Alcohol use disorder in sustained remission. Chronic pain. The patient is referred for psychiatric services for medication management.   Patient is here with his family member tolerating medications. He's changing primary care said that he wants amitriptyline because it helps him sleep and also the pain lamictal at 62m dose works or and no rash.  Describes mood to be somewhat  less depressed. There is no rash.  In regarding to his anxiety related PTSD still has fear of being around people but he was able to go out at times with family member. Mood: upset at times but no psychosis.  Medical complexity/ Data; no rash today. Also mood  exacerbated by his shoulder and joint pains No relapse on alcohol for more then 3 years.  . Quality:  [place]. Sister lives next to him but sick so he has to take care of her.  Anxiety has improved on buspirone but still does not want anybody at his back and occassional flashbacks from the past.  Has Applied for disability  . Severity:  Depression: 6/10 (0=Very depressed; 5=Neutral; 10=Very Happy)  Anxiety- 6/10 (0=no anxiety; 5= moderate/tolerable anxiety; 10= panic attacks)  . Duration: Since the age of 89 . Timing: Worse midday 10 AM to 4 Pm  . Context: Being in front of people.  . Modifying factors: Dog and medictions Group therapy.  . Associated signs and symptoms:   Past Medical History  Diagnosis Date  . PTSD (post-traumatic stress disorder)   . Coronary artery disease   .  Hypercholesterolemia   . Fatty liver   . Hypercholesteremia   . Hypertension   . COPD (chronic obstructive pulmonary disease)    Family History  Problem Relation Age of Onset  . Alcohol abuse Father   . Anxiety disorder Father   . Depression Father   . Coronary artery disease Father   . Alcohol abuse Brother   . Coronary artery disease Brother   . Schizophrenia Neg Hx   . Diabetes Mellitus II Neg Hx   . Drug abuse Brother   . Depression Brother 340   Commited suicide   . Cancer Mother 494   ovarian    Outpatient Encounter Prescriptions as of 12/07/2014  Medication Sig  . acetaminophen-codeine (TYLENOL #3) 300-30 MG per tablet Take 1 tablet by mouth every 8 (eight) hours as needed. (Patient not taking: Reported on 09/20/2014)  . albuterol (PROVENTIL HFA;VENTOLIN HFA) 108 (90 BASE) MCG/ACT inhaler Inhale 2 puffs into the lungs every 6 (six) hours as needed for wheezing or shortness of breath.  .Marland Kitchenamitriptyline (ELAVIL) 50 MG tablet Take 1 tablet (50 mg total) by mouth at bedtime.  .Marland Kitchenamitriptyline (ELAVIL) 50 MG tablet Take 1 tablet (50 mg total) by mouth at bedtime.  . busPIRone (BUSPAR) 10 MG tablet Take 1 tablet (10 mg total) by mouth 2 (two) times daily.  .Marland Kitchenezetimibe-simvastatin (VYTORIN) 10-10 MG per tablet Take 1 tablet by mouth at bedtime.  .Marland KitchenHYDROcodone-acetaminophen (NORCO/VICODIN) 5-325 MG per tablet Take  1-2 tablets by mouth every 6 (six) hours as needed. (Patient not taking: Reported on 08/21/2014)  . lamoTRIgine (LAMICTAL) 25 MG tablet Take 2 tablets (50 mg total) by mouth daily.  Marland Kitchen lisinopril-hydrochlorothiazide (PRINZIDE,ZESTORETIC) 20-25 MG per tablet Take 1 tablet by mouth daily.  . ondansetron (ZOFRAN) 4 MG tablet Take 1 tablet (4 mg total) by mouth every 8 (eight) hours as needed.  . Vitamin D, Ergocalciferol, (DRISDOL) 50000 UNITS CAPS capsule Take 1 capsule (50,000 Units total) by mouth every 7 (seven) days.  . [DISCONTINUED] amitriptyline (ELAVIL) 50 MG tablet Take  1 tablet (50 mg total) by mouth at bedtime.  . [DISCONTINUED] busPIRone (BUSPAR) 10 MG tablet Take 1 tablet (10 mg total) by mouth 2 (two) times daily.  . [DISCONTINUED] lamoTRIgine (LAMICTAL) 25 MG tablet Take 2 tablets (50 mg total) by mouth daily.   No facility-administered encounter medications on file as of 12/07/2014.    Recent Results (from the past 2160 hour(s))  CBC with Differential/Platelet     Status: Abnormal   Collection Time: 09/20/14  8:32 PM  Result Value Ref Range   WBC 14.3 (H) 4.0 - 10.5 K/uL   RBC 5.19 4.22 - 5.81 MIL/uL   Hemoglobin 17.6 (H) 13.0 - 17.0 g/dL   HCT 48.2 39.0 - 52.0 %   MCV 92.9 78.0 - 100.0 fL   MCH 33.9 26.0 - 34.0 pg   MCHC 36.5 (H) 30.0 - 36.0 g/dL   RDW 12.7 11.5 - 15.5 %   Platelets 258 150 - 400 K/uL   Neutrophils Relative % 80 (H) 43 - 77 %   Neutro Abs 11.5 (H) 1.7 - 7.7 K/uL   Lymphocytes Relative 14 12 - 46 %   Lymphs Abs 2.0 0.7 - 4.0 K/uL   Monocytes Relative 5 3 - 12 %   Monocytes Absolute 0.7 0.1 - 1.0 K/uL   Eosinophils Relative 1 0 - 5 %   Eosinophils Absolute 0.1 0.0 - 0.7 K/uL   Basophils Relative 0 0 - 1 %   Basophils Absolute 0.0 0.0 - 0.1 K/uL  Comprehensive metabolic panel     Status: Abnormal   Collection Time: 09/20/14  8:32 PM  Result Value Ref Range   Sodium 136 135 - 145 mmol/L   Potassium 3.4 (L) 3.5 - 5.1 mmol/L   Chloride 99 (L) 101 - 111 mmol/L   CO2 24 22 - 32 mmol/L   Glucose, Bld 117 (H) 65 - 99 mg/dL   BUN 13 6 - 20 mg/dL   Creatinine, Ser 1.06 0.61 - 1.24 mg/dL   Calcium 9.4 8.9 - 10.3 mg/dL   Total Protein 8.5 (H) 6.5 - 8.1 g/dL   Albumin 4.5 3.5 - 5.0 g/dL   AST 30 15 - 41 U/L   ALT 35 17 - 63 U/L   Alkaline Phosphatase 75 38 - 126 U/L   Total Bilirubin 1.1 0.3 - 1.2 mg/dL   GFR calc non Af Amer >60 >60 mL/min   GFR calc Af Amer >60 >60 mL/min    Comment: (NOTE) The eGFR has been calculated using the CKD EPI equation. This calculation has not been validated in all clinical situations. eGFR's  persistently <60 mL/min signify possible Chronic Kidney Disease.    Anion gap 13 5 - 15  Lipase, blood     Status: Abnormal   Collection Time: 09/20/14  8:32 PM  Result Value Ref Range   Lipase 19 (L) 22 - 51 U/L  Urinalysis, Routine w reflex  microscopic (not at [facility])     Status: Abnormal   Collection Time: 09/20/14  8:52 PM  Result Value Ref Range   Color, Urine YELLOW YELLOW   APPearance CLEAR CLEAR   Specific Gravity, Urine 1.015 1.005 - 1.030   pH 7.5 5.0 - 8.0   Glucose, UA NEGATIVE NEGATIVE mg/dL   Hgb urine dipstick MODERATE (A) NEGATIVE   Bilirubin Urine NEGATIVE NEGATIVE   Ketones, ur NEGATIVE NEGATIVE mg/dL   Protein, ur 100 (A) NEGATIVE mg/dL   Urobilinogen, UA 0.2 0.0 - 1.0 mg/dL   Nitrite NEGATIVE NEGATIVE   Leukocytes, UA NEGATIVE NEGATIVE  Urine microscopic-add on     Status: None   Collection Time: 09/20/14  8:52 PM  Result Value Ref Range   Squamous Epithelial / LPF RARE RARE   WBC, UA 0-2 <3 WBC/hpf   RBC / HPF 7-10 <3 RBC/hpf   Bacteria, UA RARE RARE    BP 124/74 mmHg  Pulse 84  Ht '5\' 11"'  (1.803 m)  Wt 256 lb (116.121 kg)  BMI 35.72 kg/m2   Review of Systems  Cardiovascular: Negative for chest pain.  Musculoskeletal: Positive for back pain.  Skin: Negative for rash.  Neurological: Negative for tingling, tremors and headaches.  Psychiatric/Behavioral: Negative for substance abuse.    Mental Status Examination  Appearance: casual Alert: Yes Attention: fair  Cooperative: Yes Eye Contact: Good Speech: normal tone Psychomotor Activity: Normal Memory/Concentration: reasonable Oriented: person, place and time/date Mood: Dysphoric with some irritability Affect: Congruent Thought Processes and Associations: Coherent Fund of Knowledge: Fair Thought Content: Suicidal ideation and Homicidal ideation were denied. Insight: Fair Judgement: Fair  Diagnosis: Maj. depressive disorder. Possible bipolar disorder depressed type without psychotic  features. Chronic pain. PTSD tp be  Ruled out. Alcohol use disorder in sustained remission.  Cluster B traits per history.  Treatment Plan:   Continue lamictal 50-mg  For mood symptoms. Do not increase dose Anxiety and PTSD: continue buspirone 9m bid. He is not ready to quit smoking. Info and counselling provided.  Chronic pain dealt with other providers it does effect his mood and sleep.  Will refill ellavil [id number] now till he finds another primary care. Discussed alcohol relapse no craving for now.  Nicotine use; He has cut down and counselling given. He is working on quitting in next month.  Medication Side effects, benefits and risks reviewed/discussed with Patient. Time given for patient to respond and asks questions regarding the Diagnosis and Medications. Safety concerns and to report to ER if suicidal or call 911. Relevant Medications refilled or called in to pharmacy. Discussed weight maintenance and Sleep Hygiene. Follow up with Primary care provider in regards to Medical conditions. Recommend compliance with medications and follow up office appointments. Discussed to avail opportunity to consider or/and continue Individual therapy with Counselor. Greater than 50% of time was spend in counseling and coordination of care with the patient.  Schedule for Follow up visit in 10  weeks or call in earlier as necessary.  AKMerian CapronMD 12/07/2014

## 2015-01-21 ENCOUNTER — Ambulatory Visit (INDEPENDENT_AMBULATORY_CARE_PROVIDER_SITE_OTHER): Payer: Medicaid Other | Admitting: Family Medicine

## 2015-01-21 ENCOUNTER — Encounter: Payer: Self-pay | Admitting: Family Medicine

## 2015-01-21 VITALS — BP 131/89 | HR 92 | Temp 97.9°F | Ht 71.0 in | Wt 234.4 lb

## 2015-01-21 DIAGNOSIS — R1013 Epigastric pain: Secondary | ICD-10-CM

## 2015-01-21 MED ORDER — OMEPRAZOLE 40 MG PO CPDR: 40.0000 mg | DELAYED_RELEASE_CAPSULE | Freq: Every day | ORAL | Status: DC

## 2015-01-21 MED ORDER — ONDANSETRON 4 MG PO TBDP: 4.0000 mg | ORAL_TABLET | Freq: Three times a day (TID) | ORAL | Status: DC | PRN

## 2015-01-21 MED ORDER — OXYCODONE HCL 5 MG PO TABS: 5.0000 mg | ORAL_TABLET | ORAL | Status: DC | PRN

## 2015-01-21 NOTE — Progress Notes (Signed)
BP 131/89 mmHg  Pulse 92  Temp(Src) 97.9 F (36.6 C) (Oral)  Ht 5\' 11"  (1.803 m)  Wt 234 lb 6.4 oz (106.323 kg)  BMI 32.71 kg/m2   Subjective:    Patient ID: Oscar Hall, male    DOB: 06-17-1960, 54 y.o.   MRN: [id number]  HPI: Oscar Hall is a 54 y.o. male presenting on 01/21/2015 for Vomiting and Abdominal Pain   HPI Abdominal pain and vomiting Patient presents today with abdominal pain and vomiting that has been occurring sporadically over the past 2 years. This current episode has been going on for the past 5 days. He complains of epigastric abdominal pain and nausea and belching and burping and vomiting. He denies any blood in his vomitus or in his stools and he denies any dark tarry stools. He does feel lightheaded and dizzy on occasion because of not being able to eat as much. He does manage to stay hydrated. He does admit to having reflux issues before. He has also had diagnosis of fatty liver disease previously.  Relevant past medical, surgical, family and social history reviewed and updated as indicated. Interim medical history since our last visit reviewed. Allergies and medications reviewed and updated.  Review of Systems  Constitutional: Negative for fever and chills.  HENT: Negative for congestion, ear discharge and ear pain.   Eyes: Negative for discharge and visual disturbance.  Respiratory: Negative for shortness of breath and wheezing.   Cardiovascular: Negative for chest pain and leg swelling.  Gastrointestinal: Positive for nausea, vomiting and abdominal pain. Negative for diarrhea, constipation, blood in stool, abdominal distention and anal bleeding.  Genitourinary: Negative for difficulty urinating.  Musculoskeletal: Negative for back pain and gait problem.  Skin: Negative for rash.  Neurological: Positive for dizziness. Negative for syncope, light-headedness and headaches.  All other systems reviewed and are negative.   Per HPI unless specifically  indicated above  Social History   Social History  . Marital Status: Unknown    Spouse Name: N/A  . Number of Children: N/A  . Years of Education: N/A   Occupational History  . Not on file.   Social History Main Topics  . Smoking status: Current Every Day Smoker -- 0.50 packs/day for 30 years    Types: Cigarettes    Start date: 03/24/1983  . Smokeless tobacco: Not on file     Comment: Smoking 10 cigs/day  . Alcohol Use: No     Comment: Quit drinking. 2-3 fifths, binge drinking.  . Drug Use: No     Comment: last used x4 days.  . Sexual Activity:    Partners: Female   Other Topics Concern  . Not on file   Social History Narrative    Past Surgical History  Procedure Laterality Date  . Throat surgery      traumatic injury  . Hand surgery    . Knee surgery    . Shoulder surgery      Family History  Problem Relation Age of Onset  . Alcohol abuse Father   . Anxiety disorder Father   . Depression Father   . Coronary artery disease Father   . Alcohol abuse Brother   . Coronary artery disease Brother   . Schizophrenia Neg Hx   . Diabetes Mellitus II Neg Hx   . Drug abuse Brother   . Depression Brother 34    Commited suicide   . Cancer Mother 6    ovarian      Medication List  This list is accurate as of: 01/21/15 10:54 AM.  Always use your most recent med list.               acetaminophen-codeine 300-30 MG tablet  Commonly known as:  TYLENOL #3  Take 1 tablet by mouth every 8 (eight) hours as needed.     albuterol 108 (90 BASE) MCG/ACT inhaler  Commonly known as:  PROVENTIL HFA;VENTOLIN HFA  Inhale 2 puffs into the lungs every 6 (six) hours as needed for wheezing or shortness of breath.     amitriptyline 50 MG tablet  Commonly known as:  ELAVIL  Take 1 tablet (50 mg total) by mouth at bedtime.     busPIRone 10 MG tablet  Commonly known as:  BUSPAR  Take 1 tablet (10 mg total) by mouth 2 (two) times daily.     ezetimibe-simvastatin 10-10  MG tablet  Commonly known as:  VYTORIN  Take 1 tablet by mouth at bedtime.     HYDROcodone-acetaminophen 5-325 MG tablet  Commonly known as:  NORCO/VICODIN  Take 1-2 tablets by mouth every 6 (six) hours as needed.     lamoTRIgine 25 MG tablet  Commonly known as:  LAMICTAL  Take 2 tablets (50 mg total) by mouth daily.     lisinopril-hydrochlorothiazide 20-25 MG tablet  Commonly known as:  PRINZIDE,ZESTORETIC  Take 1 tablet by mouth daily.     omeprazole 40 MG capsule  Commonly known as:  PRILOSEC  Take 1 capsule (40 mg total) by mouth daily.     ondansetron 4 MG disintegrating tablet  Commonly known as:  ZOFRAN ODT  Take 1 tablet (4 mg total) by mouth every 8 (eight) hours as needed for nausea or vomiting.           Objective:    BP 131/89 mmHg  Pulse 92  Temp(Src) 97.9 F (36.6 C) (Oral)  Ht 5\' 11"  (1.803 m)  Wt 234 lb 6.4 oz (106.323 kg)  BMI 32.71 kg/m2  Wt Readings from Last 3 Encounters:  01/21/15 234 lb 6.4 oz (106.323 kg)  12/07/14 256 lb (116.121 kg)  10/05/14 259 lb 9.6 oz (117.754 kg)    Physical Exam  Constitutional: He is oriented to person, place, and time. He appears well-developed and well-nourished. No distress.  Eyes: Conjunctivae and EOM are normal. Pupils are equal, round, and reactive to light. Right eye exhibits no discharge. No scleral icterus.  Cardiovascular: Normal rate, regular rhythm, normal heart sounds and intact distal pulses.   No murmur heard. Pulmonary/Chest: Effort normal and breath sounds normal. No respiratory distress. He has no wheezes.  Abdominal: Soft. Bowel sounds are normal. He exhibits no distension. There is tenderness in the epigastric area. There is no rebound, no guarding and no CVA tenderness.  Musculoskeletal: Normal range of motion. He exhibits no edema.  Neurological: He is alert and oriented to person, place, and time. Coordination normal.  Skin: Skin is warm and dry. No rash noted. He is not diaphoretic.    Psychiatric: He has a normal mood and affect. His behavior is normal.  Vitals reviewed.   Results for orders placed or performed during the hospital encounter of 09/20/14  CBC with Differential/Platelet  Result Value Ref Range   WBC 14.3 (H) 4.0 - 10.5 K/uL   RBC 5.19 4.22 - 5.81 MIL/uL   Hemoglobin 17.6 (H) 13.0 - 17.0 g/dL   HCT 48.2 39.0 - 52.0 %   MCV 92.9 78.0 - 100.0 fL   MCH 33.9  26.0 - 34.0 pg   MCHC 36.5 (H) 30.0 - 36.0 g/dL   RDW 12.7 11.5 - 15.5 %   Platelets 258 150 - 400 K/uL   Neutrophils Relative % 80 (H) 43 - 77 %   Neutro Abs 11.5 (H) 1.7 - 7.7 K/uL   Lymphocytes Relative 14 12 - 46 %   Lymphs Abs 2.0 0.7 - 4.0 K/uL   Monocytes Relative 5 3 - 12 %   Monocytes Absolute 0.7 0.1 - 1.0 K/uL   Eosinophils Relative 1 0 - 5 %   Eosinophils Absolute 0.1 0.0 - 0.7 K/uL   Basophils Relative 0 0 - 1 %   Basophils Absolute 0.0 0.0 - 0.1 K/uL  Comprehensive metabolic panel  Result Value Ref Range   Sodium 136 135 - 145 mmol/L   Potassium 3.4 (L) 3.5 - 5.1 mmol/L   Chloride 99 (L) 101 - 111 mmol/L   CO2 24 22 - 32 mmol/L   Glucose, Bld 117 (H) 65 - 99 mg/dL   BUN 13 6 - 20 mg/dL   Creatinine, Ser 1.06 0.61 - 1.24 mg/dL   Calcium 9.4 8.9 - 10.3 mg/dL   Total Protein 8.5 (H) 6.5 - 8.1 g/dL   Albumin 4.5 3.5 - 5.0 g/dL   AST 30 15 - 41 U/L   ALT 35 17 - 63 U/L   Alkaline Phosphatase 75 38 - 126 U/L   Total Bilirubin 1.1 0.3 - 1.2 mg/dL   GFR calc non Af Amer >60 >60 mL/min   GFR calc Af Amer >60 >60 mL/min   Anion gap 13 5 - 15  Lipase, blood  Result Value Ref Range   Lipase 19 (L) 22 - 51 U/L  Urinalysis, Routine w reflex microscopic (not at [facility])  Result Value Ref Range   Color, Urine YELLOW YELLOW   APPearance CLEAR CLEAR   Specific Gravity, Urine 1.015 1.005 - 1.030   pH 7.5 5.0 - 8.0   Glucose, UA NEGATIVE NEGATIVE mg/dL   Hgb urine dipstick MODERATE (A) NEGATIVE   Bilirubin Urine NEGATIVE NEGATIVE   Ketones, ur NEGATIVE NEGATIVE mg/dL   Protein, ur  100 (A) NEGATIVE mg/dL   Urobilinogen, UA 0.2 0.0 - 1.0 mg/dL   Nitrite NEGATIVE NEGATIVE   Leukocytes, UA NEGATIVE NEGATIVE  Urine microscopic-add on  Result Value Ref Range   Squamous Epithelial / LPF RARE RARE   WBC, UA 0-2 <3 WBC/hpf   RBC / HPF 7-10 <3 RBC/hpf   Bacteria, UA RARE RARE      Assessment & Plan:   Problem List Items Addressed This Visit    None    Visit Diagnoses    Epigastric pain    -  Primary    Concern for GERD versus fatty liver disease problems. We will send to gastroenterology    Relevant Medications    ondansetron (ZOFRAN ODT) 4 MG disintegrating tablet    omeprazole (PRILOSEC) 40 MG capsule    oxyCODONE (ROXICODONE) 5 MG immediate release tablet    Other Relevant Orders    GI COCKTAIL UP TO 45 CC    Ambulatory referral to Gastroenterology    US Abdomen Limited RUQ        Follow up plan: Return in about 4 weeks (around 02/18/2015), or if symptoms worsen or fail to improve.  [name], MD [facility] 01/21/2015, 10:54 AM

## 2015-01-28 ENCOUNTER — Encounter: Payer: Self-pay | Admitting: Gastroenterology

## 2015-01-28 ENCOUNTER — Ambulatory Visit (HOSPITAL_COMMUNITY): Payer: Medicaid Other

## 2015-02-04 ENCOUNTER — Ambulatory Visit (HOSPITAL_COMMUNITY)
Admission: RE | Admit: 2015-02-04 | Discharge: 2015-02-04 | Disposition: A | Discharge status: Home / Self Care | Payer: Medicaid Other | Source: Ambulatory Visit | Attending: Family Medicine | Admitting: Family Medicine

## 2015-02-04 DIAGNOSIS — R1013 Epigastric pain: Secondary | ICD-10-CM

## 2015-02-04 DIAGNOSIS — R1011 Right upper quadrant pain: Secondary | ICD-10-CM | POA: Insufficient documentation

## 2015-02-04 DIAGNOSIS — R938 Abnormal findings on diagnostic imaging of other specified body structures: Secondary | ICD-10-CM | POA: Insufficient documentation

## 2015-02-05 ENCOUNTER — Other Ambulatory Visit: Payer: Self-pay

## 2015-02-05 DIAGNOSIS — R1013 Epigastric pain: Secondary | ICD-10-CM

## 2015-02-05 NOTE — Progress Notes (Signed)
Patient informed, referral for general surgery sent

## 2015-02-08 ENCOUNTER — Ambulatory Visit (HOSPITAL_COMMUNITY): Payer: Self-pay | Admitting: Psychiatry

## 2015-02-13 ENCOUNTER — Encounter (HOSPITAL_COMMUNITY): Payer: Self-pay | Admitting: Psychiatry

## 2015-02-13 ENCOUNTER — Ambulatory Visit (INDEPENDENT_AMBULATORY_CARE_PROVIDER_SITE_OTHER): Payer: Medicaid Other | Admitting: Psychiatry

## 2015-02-13 VITALS — BP 116/62 | HR 96 | Ht 71.0 in | Wt 248.0 lb

## 2015-02-13 DIAGNOSIS — F3113 Bipolar disorder, current episode manic without psychotic features, severe: Secondary | ICD-10-CM

## 2015-02-13 DIAGNOSIS — G894 Chronic pain syndrome: Secondary | ICD-10-CM

## 2015-02-13 DIAGNOSIS — F17200 Nicotine dependence, unspecified, uncomplicated: Secondary | ICD-10-CM | POA: Diagnosis not present

## 2015-02-13 DIAGNOSIS — F329 Major depressive disorder, single episode, unspecified: Secondary | ICD-10-CM

## 2015-02-13 DIAGNOSIS — F4310 Post-traumatic stress disorder, unspecified: Secondary | ICD-10-CM | POA: Diagnosis not present

## 2015-02-13 MED ORDER — LAMOTRIGINE 25 MG PO TABS: 50.0000 mg | ORAL_TABLET | Freq: Every day | ORAL | Status: DC

## 2015-02-13 MED ORDER — BUSPIRONE HCL 10 MG PO TABS: 10.0000 mg | ORAL_TABLET | Freq: Two times a day (BID) | ORAL | Status: DC

## 2015-02-13 MED ORDER — AMITRIPTYLINE HCL 50 MG PO TABS: 50.0000 mg | ORAL_TABLET | Freq: Every day | ORAL | Status: DC

## 2015-02-13 NOTE — Progress Notes (Signed)
Patient ID: Estus Rito, male   DOB: Dec 09, 1960, 54 y.o.   MRN: [id number] [facility] Follow-up Outpatient Visit  Rhylan Dacres [id number] 54 y.o.  02/13/2015  Chief Complaint: depression and follow up.      History of Present Illness:    Mr. Shaddox is a 54 y/o male with a past psychiatric history significant for Bipolar I Disorder, most recent episode manic, severe, without psychoatic features, rule out Post traumatic stress disorder. Alcohol use disorder in sustained remission. Chronic pain. The patient is referred for psychiatric services for medication management.   Patient is tolerating medications.  Amitriptyline  helps him sleep and also the pain lamictal at 50mg  dose works or and no rash.  Describes mood to be somewhat  less depressed. There is no rash. Somewhat nervous about upcoming gallbladder surgery In regarding to his anxiety related PTSD still has fear of being around people but he was able to go out at times with family member. Mood: upset at times but no psychosis.  Medical complexity/ Data; no rash today. Also mood  exacerbated by his shoulder and joint pains. Gallbladder surgery pending No relapse on alcohol for more then 3 years.  . Quality:  [place]. Sister lives next to him but sick so he has to take care of her.  Anxiety has improved on buspirone but still does not want anybody at his back and occassional flashbacks from the past.  Has Applied for disability  . Severity:  Depression: 6/10 (0=Very depressed; 5=Neutral; 10=Very Happy)  Anxiety- 6/10 (0=no anxiety; 5= moderate/tolerable anxiety; 10= panic attacks)  . Duration: Since the age of 41  . Timing: Worse midday 10 AM to 4 Pm  . Context: Being in front of people.  . Modifying factors: Dog and medictions Group therapy.  . Associated signs and symptoms:   Past Medical History  Diagnosis Date  . PTSD (post-traumatic stress disorder)   . Coronary artery disease   .  Hypercholesterolemia   . Fatty liver   . Hypercholesteremia   . Hypertension   . COPD (chronic obstructive pulmonary disease) (HCC)    Family History  Problem Relation Age of Onset  . Alcohol abuse Father   . Anxiety disorder Father   . Depression Father   . Coronary artery disease Father   . Alcohol abuse Brother   . Coronary artery disease Brother   . Schizophrenia Neg Hx   . Diabetes Mellitus II Neg Hx   . Drug abuse Brother   . Depression Brother 39    Commited suicide   . Cancer Mother 75    ovarian    Outpatient Encounter Prescriptions as of 02/13/2015  Medication Sig  . albuterol (PROVENTIL HFA;VENTOLIN HFA) 108 (90 BASE) MCG/ACT inhaler Inhale 2 puffs into the lungs every 6 (six) hours as needed for wheezing or shortness of breath.  Marland Kitchen amitriptyline (ELAVIL) 50 MG tablet Take 1 tablet (50 mg total) by mouth at bedtime.  . busPIRone (BUSPAR) 10 MG tablet Take 1 tablet (10 mg total) by mouth 2 (two) times daily.  Marland Kitchen ezetimibe-simvastatin (VYTORIN) 10-10 MG per tablet Take 1 tablet by mouth at bedtime.  . lamoTRIgine (LAMICTAL) 25 MG tablet Take 2 tablets (50 mg total) by mouth daily.  Marland Kitchen lisinopril-hydrochlorothiazide (PRINZIDE,ZESTORETIC) 20-25 MG per tablet Take 1 tablet by mouth daily.  Marland Kitchen omeprazole (PRILOSEC) 40 MG capsule Take 1 capsule (40 mg total) by mouth daily.  . ondansetron (ZOFRAN ODT) 4 MG disintegrating tablet Take  1 tablet (4 mg total) by mouth every 8 (eight) hours as needed for nausea or vomiting.  Marland Kitchen oxyCODONE (ROXICODONE) 5 MG immediate release tablet Take 1 tablet (5 mg total) by mouth every 4 (four) hours as needed for severe pain.  . [DISCONTINUED] amitriptyline (ELAVIL) 50 MG tablet Take 1 tablet (50 mg total) by mouth at bedtime.  . [DISCONTINUED] busPIRone (BUSPAR) 10 MG tablet Take 1 tablet (10 mg total) by mouth 2 (two) times daily.  . [DISCONTINUED] lamoTRIgine (LAMICTAL) 25 MG tablet Take 2 tablets (50 mg total) by mouth daily.  . [DISCONTINUED]  acetaminophen-codeine (TYLENOL #3) 300-30 MG per tablet Take 1 tablet by mouth every 8 (eight) hours as needed. (Patient not taking: Reported on 09/20/2014)  . [DISCONTINUED] HYDROcodone-acetaminophen (NORCO/VICODIN) 5-325 MG per tablet Take 1-2 tablets by mouth every 6 (six) hours as needed. (Patient not taking: Reported on 02/13/2015)   No facility-administered encounter medications on file as of 02/13/2015.    No results found for this or any previous visit (from the past 2160 hour(s)).  BP 116/62 mmHg  Pulse 96  Ht 5\' 11"  (1.803 m)  Wt 248 lb (112.492 kg)  BMI 34.60 kg/m2  SpO2 96%   Review of Systems  Cardiovascular: Negative for chest pain.  Musculoskeletal: Positive for back pain.  Skin: Negative for rash.  Neurological: Negative for tingling, tremors and headaches.  Psychiatric/Behavioral: Negative for depression and substance abuse. The patient is nervous/anxious.     Mental Status Examination  Appearance: casual Alert: Yes Attention: fair  Cooperative: Yes Eye Contact: Good Speech: normal tone Psychomotor Activity: Normal Memory/Concentration: reasonable Oriented: person, place and time/date Mood: somewhat anxious about surgery.  Affect: Congruent Thought Processes and Associations: Coherent Fund of Knowledge: Fair Thought Content: Suicidal ideation and Homicidal ideation were denied. Insight: Fair Judgement: Fair  Diagnosis: Maj. depressive disorder. Possible bipolar disorder depressed type without psychotic features. Chronic pain. PTSD tp be  Ruled out. Alcohol use disorder in sustained remission.  Cluster B traits per history.  Treatment Plan:   Continue lamictal 50-mg  For mood symptoms. Do not increase dose as it can cause rash as per his history. Anxiety and PTSD: continue buspirone 10mg  bid. Take regularly as he is having some anxiety . He is not ready to quit smoking. Info and counselling provided.  Chronic pain dealt with other providers it does  effect his mood and sleep.  Will refill ellavil 50mg  for now till he finds another primary care. Discussed alcohol relapse no craving for now.  Nicotine use; He has cut down and counselling given. He is working on quitting in next month.  Medication Side effects, benefits and risks reviewed/discussed with Patient. Time given for patient to respond and asks questions regarding the Diagnosis and Medications. Safety concerns and to report to ER if suicidal or call 911. Relevant Medications refilled or called in to pharmacy. Discussed weight maintenance and Sleep Hygiene. Follow up with Primary care provider in regards to Medical conditions. Recommend compliance with medications and follow up office appointments. Discussed to avail opportunity to consider or/and continue Individual therapy with Counselor. Greater than 50% of time was spend in counseling and coordination of care with the patient.  Schedule for Follow up visit in 10  weeks or call in earlier as necessary.  Time spent: 25 minutes.   Merian Capron, MD 02/13/2015

## 2015-02-18 ENCOUNTER — Ambulatory Visit (INDEPENDENT_AMBULATORY_CARE_PROVIDER_SITE_OTHER): Payer: Medicaid Other | Admitting: Family Medicine

## 2015-02-18 ENCOUNTER — Encounter: Payer: Self-pay | Admitting: Family Medicine

## 2015-02-18 VITALS — BP 136/86 | HR 94 | Temp 97.0°F | Ht 71.0 in | Wt 246.6 lb

## 2015-02-18 DIAGNOSIS — K8000 Calculus of gallbladder with acute cholecystitis without obstruction: Secondary | ICD-10-CM | POA: Diagnosis not present

## 2015-02-18 DIAGNOSIS — R1013 Epigastric pain: Secondary | ICD-10-CM | POA: Diagnosis not present

## 2015-02-18 MED ORDER — ONDANSETRON 4 MG PO TBDP: 4.0000 mg | ORAL_TABLET | Freq: Three times a day (TID) | ORAL | Status: DC | PRN

## 2015-02-18 MED ORDER — OXYCODONE HCL 5 MG PO TABS: 5.0000 mg | ORAL_TABLET | ORAL | Status: DC | PRN

## 2015-02-18 NOTE — Progress Notes (Signed)
BP 136/86 mmHg  Pulse 94  Temp(Src) 97 F (36.1 C) (Oral)  Ht 5\' 11"  (1.803 m)  Wt 246 lb 9.6 oz (111.857 kg)  BMI 34.41 kg/m2   Subjective:    Patient ID: Oscar Hall, male    DOB: 06-18-1960, 54 y.o.   MRN: [id number]  HPI: Oscar Hall is a 54 y.o. male presenting on 02/18/2015 for Abdominal Pain and Medication Refill   HPI Epigastric abdominal pain Patient continues to have epigastric abdominal pain that has been persistent and colicky over the past couple years. A gallbladder ultrasound showed that he had polyps versus stones in the gallbladder and signs of cholecystitis. A referral was made to surgery and he has an appointment with surgery on 03/01/2015. He is coming in today because he needs some more Zofran and pain meds to get him through to that appointment. He also has a previous history of fatty liver disease and has an appointment with his gastroenterologist for that.  Relevant past medical, surgical, family and social history reviewed and updated as indicated. Interim medical history since our last visit reviewed. Allergies and medications reviewed and updated.  Review of Systems  Constitutional: Negative for fever and chills.  HENT: Negative for congestion, ear discharge and ear pain.   Eyes: Negative for discharge and visual disturbance.  Respiratory: Negative for shortness of breath and wheezing.   Cardiovascular: Negative for chest pain and leg swelling.  Gastrointestinal: Positive for nausea, vomiting and abdominal pain. Negative for diarrhea, constipation, blood in stool, abdominal distention and anal bleeding.  Genitourinary: Negative for difficulty urinating.  Musculoskeletal: Negative for back pain and gait problem.  Skin: Negative for rash.  Neurological: Negative for dizziness, syncope, light-headedness and headaches.  All other systems reviewed and are negative.   Per HPI unless specifically indicated above     Medication List       This list  is accurate as of: 02/18/15 10:36 AM.  Always use your most recent med list.               albuterol 108 (90 BASE) MCG/ACT inhaler  Commonly known as:  PROVENTIL HFA;VENTOLIN HFA  Inhale 2 puffs into the lungs every 6 (six) hours as needed for wheezing or shortness of breath.     amitriptyline 50 MG tablet  Commonly known as:  ELAVIL  Take 1 tablet (50 mg total) by mouth at bedtime.     busPIRone 10 MG tablet  Commonly known as:  BUSPAR  Take 1 tablet (10 mg total) by mouth 2 (two) times daily.     ezetimibe-simvastatin 10-10 MG tablet  Commonly known as:  VYTORIN  Take 1 tablet by mouth at bedtime.     lamoTRIgine 25 MG tablet  Commonly known as:  LAMICTAL  Take 2 tablets (50 mg total) by mouth daily.     lisinopril-hydrochlorothiazide 20-25 MG tablet  Commonly known as:  PRINZIDE,ZESTORETIC  Take 1 tablet by mouth daily.     omeprazole 40 MG capsule  Commonly known as:  PRILOSEC  Take 1 capsule (40 mg total) by mouth daily.     ondansetron 4 MG disintegrating tablet  Commonly known as:  ZOFRAN ODT  Take 1 tablet (4 mg total) by mouth every 8 (eight) hours as needed for nausea or vomiting.     oxyCODONE 5 MG immediate release tablet  Commonly known as:  ROXICODONE  Take 1 tablet (5 mg total) by mouth every 4 (four) hours as needed for severe pain.  Objective:    BP 136/86 mmHg  Pulse 94  Temp(Src) 97 F (36.1 C) (Oral)  Ht 5\' 11"  (1.803 m)  Wt 246 lb 9.6 oz (111.857 kg)  BMI 34.41 kg/m2  Wt Readings from Last 3 Encounters:  02/18/15 246 lb 9.6 oz (111.857 kg)  02/13/15 248 lb (112.492 kg)  01/21/15 234 lb 6.4 oz (106.323 kg)    Physical Exam  Constitutional: He is oriented to person, place, and time. He appears well-developed and well-nourished. No distress.  Eyes: Conjunctivae and EOM are normal. Pupils are equal, round, and reactive to light. Right eye exhibits no discharge. No scleral icterus.  Neck: Neck supple. No thyromegaly present.    Cardiovascular: Normal rate, regular rhythm, normal heart sounds and intact distal pulses.   No murmur heard. Pulmonary/Chest: Effort normal and breath sounds normal. No respiratory distress. He has no wheezes.  Abdominal: Soft. Bowel sounds are normal. He exhibits no distension and no mass. There is tenderness in the epigastric area. There is no rebound, no guarding and no CVA tenderness.  Musculoskeletal: Normal range of motion. He exhibits no edema.  Lymphadenopathy:    He has no cervical adenopathy.  Neurological: He is alert and oriented to person, place, and time. Coordination normal.  Skin: Skin is warm and dry. No rash noted. He is not diaphoretic.  Psychiatric: He has a normal mood and affect. His behavior is normal.  Vitals reviewed.   Results for orders placed or performed during the hospital encounter of 09/20/14  CBC with Differential/Platelet  Result Value Ref Range   WBC 14.3 (H) 4.0 - 10.5 K/uL   RBC 5.19 4.22 - 5.81 MIL/uL   Hemoglobin 17.6 (H) 13.0 - 17.0 g/dL   HCT 48.2 39.0 - 52.0 %   MCV 92.9 78.0 - 100.0 fL   MCH 33.9 26.0 - 34.0 pg   MCHC 36.5 (H) 30.0 - 36.0 g/dL   RDW 12.7 11.5 - 15.5 %   Platelets 258 150 - 400 K/uL   Neutrophils Relative % 80 (H) 43 - 77 %   Neutro Abs 11.5 (H) 1.7 - 7.7 K/uL   Lymphocytes Relative 14 12 - 46 %   Lymphs Abs 2.0 0.7 - 4.0 K/uL   Monocytes Relative 5 3 - 12 %   Monocytes Absolute 0.7 0.1 - 1.0 K/uL   Eosinophils Relative 1 0 - 5 %   Eosinophils Absolute 0.1 0.0 - 0.7 K/uL   Basophils Relative 0 0 - 1 %   Basophils Absolute 0.0 0.0 - 0.1 K/uL  Comprehensive metabolic panel  Result Value Ref Range   Sodium 136 135 - 145 mmol/L   Potassium 3.4 (L) 3.5 - 5.1 mmol/L   Chloride 99 (L) 101 - 111 mmol/L   CO2 24 22 - 32 mmol/L   Glucose, Bld 117 (H) 65 - 99 mg/dL   BUN 13 6 - 20 mg/dL   Creatinine, Ser 1.06 0.61 - 1.24 mg/dL   Calcium 9.4 8.9 - 10.3 mg/dL   Total Protein 8.5 (H) 6.5 - 8.1 g/dL   Albumin 4.5 3.5 - 5.0  g/dL   AST 30 15 - 41 U/L   ALT 35 17 - 63 U/L   Alkaline Phosphatase 75 38 - 126 U/L   Total Bilirubin 1.1 0.3 - 1.2 mg/dL   GFR calc non Af Amer >60 >60 mL/min   GFR calc Af Amer >60 >60 mL/min   Anion gap 13 5 - 15  Lipase, blood  Result Value  Ref Range   Lipase 19 (L) 22 - 51 U/L  Urinalysis, Routine w reflex microscopic (not at [facility])  Result Value Ref Range   Color, Urine YELLOW YELLOW   APPearance CLEAR CLEAR   Specific Gravity, Urine 1.015 1.005 - 1.030   pH 7.5 5.0 - 8.0   Glucose, UA NEGATIVE NEGATIVE mg/dL   Hgb urine dipstick MODERATE (A) NEGATIVE   Bilirubin Urine NEGATIVE NEGATIVE   Ketones, ur NEGATIVE NEGATIVE mg/dL   Protein, ur 100 (A) NEGATIVE mg/dL   Urobilinogen, UA 0.2 0.0 - 1.0 mg/dL   Nitrite NEGATIVE NEGATIVE   Leukocytes, UA NEGATIVE NEGATIVE  Urine microscopic-add on  Result Value Ref Range   Squamous Epithelial / LPF RARE RARE   WBC, UA 0-2 <3 WBC/hpf   RBC / HPF 7-10 <3 RBC/hpf   Bacteria, UA RARE RARE      Assessment & Plan:       Problem List Items Addressed This Visit    None    Visit Diagnoses    Calculus of gallbladder with acute cholecystitis without obstruction    -  Primary    Concern for polyps versus stones in his gallbladder that are causing issues. We'll refill Zofran and pain meds to get him to the surgery on 03/01/2015.    Relevant Medications    oxyCODONE (ROXICODONE) 5 MG immediate release tablet    ondansetron (ZOFRAN ODT) 4 MG disintegrating tablet    Epigastric pain        Concern for GERD versus fatty liver disease problems. We will send to gastroenterology    Relevant Medications    oxyCODONE (ROXICODONE) 5 MG immediate release tablet    ondansetron (ZOFRAN ODT) 4 MG disintegrating tablet        Follow up plan: Return if symptoms worsen or fail to improve.  Counseling provided for all of the vaccine components No orders of the defined types were placed in this encounter.    [name], MD [facility] 02/18/2015, 10:36 AM

## 2015-02-19 ENCOUNTER — Ambulatory Visit: Payer: Self-pay | Admitting: Gastroenterology

## 2015-02-26 ENCOUNTER — Ambulatory Visit: Payer: Medicaid Other | Admitting: Nurse Practitioner

## 2015-03-01 ENCOUNTER — Other Ambulatory Visit: Payer: Self-pay | Admitting: Surgery

## 2015-03-01 NOTE — H&P (Signed)
Oscar Hall 03/01/2015 2:33 PM Location: [facility] Patient #: [id number] DOB: November 16, 1960 Married / Language: Oscar Hall / Race: White Male  History of Present Illness Oscar Hall; 03/01/2015 3:30 PM) Patient words: reck.  The patient is a 54 year old male who presents for evaluation of gall stones. Note for "Gall stones": Patient sent by Dr. Domenic Schwab over concerns of biliary colic with impacted gallstones versus gallbladder polyps  Oscar Hall smoking male with history of PTSD bipolar disorder. He was intermittent episodes of nausea and vomiting and upper abdominal pain. Usually triggered by greasy foods. Been going on for 2 and a half years. And ultrasounds that were unrevealing for gallbladder problems. However ultrasound last month showed some filling defects suspicious for impacted polyps versus stones. Surgical consultation requested. Also has an obvious lump near his bellybutton. He recalls having discussions with medical doctors had any pain and Oscar Hall . He was told that it really wasn't hernia . Told surgery was cosmetic surgery. CT Scan in 2015 confirms hernia containing omental fat.  [name] questions he had heartburn or reflux but does not feel that that has helped as much. Normally moves his bowels about every day. Can walk about 10:15 minutes before he has to stop. His heart attacks. He's gone back down from 3 packs a day to half pack a day. The room smells like it. He was due to see with gastroenterology for bowel issues but given this appointment first. There is discussion about getting a colonoscopy. He's never had abdominal surgery. Recalls being told he had fatty change in his liver. No cirrhosis. Any drinking but some concern of smell as well.  He doesn't have pain right now but is struggling often. He wondered if I could give him some narcotic medicine to tide him over since he has problems with nonsteroidals and other issues. He received some  from his primary care doctor. I noted I would rather just admit him and do surgery if his pain is so bad he is needing narcotics regularly . He did not seem interested in that and told he could wait until January. Seem quite angry that I would not give some to him right now      CLINICAL DATA: Right upper quadrant pain for 2 weeks EXAM: ULTRASOUND ABDOMEN COMPLETE COMPARISON: 09/14/2013 FINDINGS: Gallbladder: The gallbladder is well distended with multiple non mobile echogenicities likely representing small gallbladder polyps or adherent stones. Gallbladder wall is mildly thickened at 3.6 mm. No pericholecystic fluid is seen. Negative sonographic Percell Miller sign is noted. Common bile duct: Diameter: 4.7 mm. Liver: No focal lesion identified. Within normal limits in parenchymal echogenicity. IVC: No abnormality visualized. Pancreas: Not well visualized due to overlying bowel gas Spleen: Within normal limits with the exception of a small splenic granuloma. Right Kidney: Length: 12.3 cm. Echogenicity within normal limits. No mass or hydronephrosis visualized. Left Kidney: Length: 15.1 cm. Fullness of the pelvis is noted without significant ureteral dilatation. This likely represents an extrarenal pelvis. Abdominal aorta: No aneurysm visualized. Other findings: None. IMPRESSION: Multiple gallbladder polyps versus adherent stones. Mild gallbladder wall thickening is noted. Clinical correlation is recommended. Mildly prominent extrarenal pelvis on the left. No other focal abnormality is noted. Electronically Signed By: Oscar Hall M.D. On: 02/04/2015 11:12   Result Notes Notes Recorded by Oscar Rancher, Hall on 02/04/2015 at 12:41 PM Patient has multiple gallbladder polyps seen on ultrasound and needs to be referred to general surgery. Oscar Pina, Hall [facility] 02/04/2015, 12:41  PM    Allergies (Oscar Hall, CMA; 03/01/2015 2:35  PM) PenicillAMINE *ASSORTED CLASSES* Vistaril *ANTIANXIETY AGENTS* Sulfa Antibiotics Lithium Bromide *CHEMICALS* TraMADol HCl *CHEMICALS* CeleBREX *ANALGESICS - ANTI-INFLAMMATORY* Neurontin *ANTICONVULSANTS* Toradol *ANALGESICS - ANTI-INFLAMMATORY* Soma Compound *MUSCULOSKELETAL THERAPY AGENTS*  Medication History (Oscar Hall, CMA; 03/01/2015 2:37 PM) Amitriptyline HCl (50MG  Tablet, Oral) Active. Lisinopril-Hydrochlorothiazide (20-25MG  Tablet, Oral) Active. Ventolin HFA (108 (90 Base)MCG/ACT Aerosol Soln, Inhalation) Active. Albuterol Sulfate (108 (90 Base)MCG/ACT Aero Pow Br Act, Inhalation) Active. LaMICtal (25MG  Tablet Chewable, Oral) Active. Omeprazole (40MG  Capsule DR, Oral) Active. Medications Reconciled    Vitals (Oscar Hall CMA; 03/01/2015 2:34 PM) 03/01/2015 2:33 PM Weight: 249 lb Height: 71in Body Surface Area: 2.31 m Body Mass Index: 34.73 kg/m  Temp.: 54F(Temporal)  Pulse: 81 (Regular)  BP: 136/80 (Sitting, Left Arm, Standard)      Physical Exam Oscar Hall; 03/01/2015 3:22 PM)  General Mental Status-Alert. General Appearance-Not in acute distress, Not Sickly. Orientation-Oriented X3. Hydration-Well hydrated. Voice-Normal.  Integumentary Global Assessment Upon inspection and palpation of skin surfaces of the - Axillae: non-tender, no inflammation or ulceration, no drainage. and Distribution of scalp and body hair is normal. General Characteristics Temperature - normal warmth is noted.  Head and Neck Head-normocephalic, atraumatic with no lesions or palpable masses. Face Global Assessment - atraumatic, no absence of expression. Neck Global Assessment - no abnormal movements, no bruit auscultated on the right, no bruit auscultated on the left, no decreased range of motion, non-tender. Trachea-midline. Thyroid Gland Characteristics - non-tender.  Eye Eyeball - Left-Extraocular movements intact,  No Nystagmus. Eyeball - Right-Extraocular movements intact, No Nystagmus. Cornea - Left-No Hazy. Cornea - Right-No Hazy. Sclera/Conjunctiva - Left-No scleral icterus, No Discharge. Sclera/Conjunctiva - Right-No scleral icterus, No Discharge. Pupil - Left-Direct reaction to light normal. Pupil - Right-Direct reaction to light normal.  ENMT Ears Pinna - Left - no drainage observed, no generalized tenderness observed. Right - no drainage observed, no generalized tenderness observed. Nose and Sinuses External Inspection of the Nose - no destructive lesion observed. Inspection of the nares - Left - quiet respiration. Right - quiet respiration. Mouth and Throat Lips - Upper Lip - no fissures observed, no pallor noted. Lower Lip - no fissures observed, no pallor noted. Nasopharynx - no discharge present. Oral Cavity/Oropharynx - Tongue - no dryness observed. Oral Mucosa - no cyanosis observed. Hypopharynx - no evidence of airway distress observed.  Chest and Lung Exam Inspection Movements - Normal and Symmetrical. Accessory muscles - No use of accessory muscles in breathing. Palpation Palpation of the chest reveals - Non-tender. Auscultation Breath sounds - Normal and Clear.  Cardiovascular Auscultation Rhythm - Regular. Murmurs & Other Heart Sounds - Auscultation of the heart reveals - No Murmurs and No Systolic Clicks.  Abdomen Inspection Inspection of the abdomen reveals - No Visible peristalsis and No Abnormal pulsations. Umbilicus - No Bleeding, No Urine drainage. Palpation/Percussion Palpation and Percussion of the abdomen reveal - Soft, Non Tender, No Rebound tenderness, No Rigidity (guarding) and No Cutaneous hyperesthesia. Note: Obese but soft. Centimeter LEFT paramedian mass soft and somewhat reducible consistent with incarcerated hernia. Guarding. No Murphy sign. Some discomfort and upper abdomen more right-sided  Male Genitourinary Sexual Maturity Tanner 5 -  Adult hair pattern and Adult penile size and shape.  Peripheral Vascular Upper Extremity Inspection - Left - No Cyanotic nailbeds, Not Ischemic. Right - No Cyanotic nailbeds, Not Ischemic.  Neurologic Neurologic evaluation reveals -normal attention span and ability to concentrate, able to name objects and repeat phrases.  Appropriate fund of knowledge , normal sensation and normal coordination. Mental Status Affect - not angry, not paranoid. Cranial Nerves-Normal Bilaterally. Gait-Normal.  Neuropsychiatric Mental status exam performed with findings of-able to articulate well with normal speech/language, rate, volume and coherence, thought content normal with ability to perform basic computations and apply abstract reasoning and no evidence of hallucinations, delusions, obsessions or homicidal/suicidal ideation. Note: Restless. Rubs bleeding point on hand. I put a Band-Aid on it. That seemed to calm him down. Tends to avoid eye contact.  Musculoskeletal Global Assessment Spine, Ribs and Pelvis - no instability, subluxation or laxity. Right Upper Extremity - no instability, subluxation or laxity.  Lymphatic Head & Neck  General Head & Neck Lymphatics: Bilateral - Description - No Localized lymphadenopathy. Axillary  General Axillary Region: Bilateral - Description - No Localized lymphadenopathy. Femoral & Inguinal  Generalized Femoral & Inguinal Lymphatics: Left - Description - No Localized lymphadenopathy. Right - Description - No Localized lymphadenopathy.    Assessment & Plan Oscar Hall; 03/01/2015 3:59 PM)  CHRONIC CHOLECYSTITIS WITH CALCULUS (K80.10) Impression: Pretty miserable with intermittent episodes of biliary colic. I think he would benefit from cholecystectomy. Could reduce and are primarily repair the incarcerated hernia at the same time since the defect does not seem to a particularly wide on CT scan. I wonder she's had chronic biliary dyskinesia and  only now is developing stones.  At one point he was asking for pain medications and then another point he said he's had this for 2 and a half years so he can wait until next month. Offered to admit him and get it done now if his pain is truly that bad, but he didn't feel it was at that at this time. He was annoyed that I wouldn't give him more narcotic pills, but then said he could get prescription somewhere else. Would be best to keep his prescriptions through one Dr. only, his primary care physician. Certainly can give him pain medications postoperatively. A lot of issues with sounds like agaoraphobia & PTSD - He gets worked up. Patient has a lot of intolerances and allergies to numerous medications . His options are limited. His wife seems to help console him now.  He seemed too annoyed to consider surgery, then he agreed to proceed with surgery. I will put orders in and see if he proceeds.  Current Plans You are being scheduled for surgery - Our schedulers will call you.  You should hear from our office's scheduling department within 5 working days about the location, date, and time of surgery. We try to make accommodations for patient's preferences in scheduling surgery, but sometimes the OR schedule or the surgeon's schedule prevents [place] from making those accommodations.  If you have not heard from our office [phone]) in 5 working days, call the office and ask for your surgeon's nurse.  If you have other questions about your diagnosis, plan, or surgery, call the office and ask for your surgeon's nurse.  The anatomy & physiology of hepatobiliary & pancreatic function was discussed. The pathophysiology of gallbladder dysfunction was discussed. Natural history risks without surgery was discussed. I feel the risks of no intervention will lead to serious problems that outweigh the operative risks; therefore, I recommended cholecystectomy to remove the pathology. I explained laparoscopic  techniques with possible need for an open approach. Probable cholangiogram to evaluate the bilary tract was explained as well.  Risks such as bleeding, infection, abscess, leak, injury to other organs, need for further treatment,  heart attack, death, and other risks were discussed. I noted a good likelihood this will help address the problem. Possibility that this will not correct all abdominal symptoms was explained. Goals of post-operative recovery were discussed as well. We will work to minimize complications. An educational handout further explaining the pathology and treatment options was given as well. Questions were answered. The patient expresses understanding & wishes to proceed with surgery.  Pt Education - Pamphlet Given - Laparoscopic Gallbladder Surgery: discussed with patient and provided information. Written instructions provided Pt Education - [facility] Laparosopic Post Op HCI (Ulysees Robarts) Pt Education - [place] (Rayetta Veith) Pt Education - Laparoscopic Cholecystectomy: gallbladder Pt Education - [facility] Pain Control (Kris No) INCARCERATED VENTRAL HERNIA (K46.0) Impression: He definitely has a hernia by CT scan exam. Does not seem like the fascial defect is at large. Can reduce that remove the gallbladder through that and primarily repair it. That might help some issues. Will not place mesh and a clean contaminated case such as a cholecystectomy. The patient seems afraid of using any mesh anyway.  Current Plans The anatomy & physiology of the abdominal wall was discussed. The pathophysiology of hernias was discussed. Natural history risks without surgery including progeressive enlargement, pain, incarceration, & strangulation was discussed. Contributors to complications such as smoking, obesity, diabetes, prior surgery, etc were discussed.  I feel the risks of no intervention will lead to serious problems that outweigh the operative risks; therefore, I recommended surgery to reduce and repair  the hernia. I explained laparoscopic techniques with possible need for an open approach. I noted the probable use of mesh to patch and/or buttress the hernia repair  Risks such as bleeding, infection, abscess, need for further treatment, heart attack, death, and other risks were discussed. I noted a good likelihood this will help address the problem. Goals of post-operative recovery were discussed as well. Possibility that this will not correct all symptoms was explained. I stressed the importance of low-impact activity, aggressive pain control, avoiding constipation, & not pushing through pain to minimize risk of post-operative chronic pain or injury. Possibility of reherniation especially with smoking, obesity, diabetes, immunosuppression, and other health conditions was discussed. We will work to minimize complications.  An educational handout further explaining the pathology & treatment options was given as well. Questions were answered. The patient expresses understanding & wishes to proceed with surgery.  Pt Education - Pamphlet Given - Laparoscopic Hernia Repair: discussed with patient and provided information. Pt Education - [facility] Hernia Post-Op HCI (Nikos Anglemyer): discussed with patient and provided information. RIGHT INGUINAL HERNIA (K40.90) Impression: He could benefit from surgery at some point. Not acutely symptomatic and not the worst of his problems. He seems set against using any mesh anyway. We'll table this issue until he can get the gallbladder issues under control.  TOBACCO ABUSE COUNSELING (Z71.6) Impression: I'm glad he's got back from 3 packs to just a half pack. I encouraged him to completely quit smoking to help lower his chance of hernia recurrence or other operative issues  Oscar Hector, M.D., F.A.C.S. Gastrointestinal and Minimally Invasive Surgery [facility] [address] [place], [place] [address] [phone] Main / Paging

## 2015-03-04 NOTE — Progress Notes (Signed)
Thank you for your help with this patient

## 2015-03-06 ENCOUNTER — Other Ambulatory Visit: Payer: Self-pay

## 2015-03-06 ENCOUNTER — Encounter: Payer: Self-pay | Admitting: Nurse Practitioner

## 2015-03-06 ENCOUNTER — Ambulatory Visit (INDEPENDENT_AMBULATORY_CARE_PROVIDER_SITE_OTHER): Payer: Medicaid Other | Admitting: Nurse Practitioner

## 2015-03-06 VITALS — BP 163/90 | HR 91 | Temp 97.2°F | Ht 71.0 in | Wt 253.8 lb

## 2015-03-06 DIAGNOSIS — R1013 Epigastric pain: Secondary | ICD-10-CM

## 2015-03-06 DIAGNOSIS — R1010 Upper abdominal pain, unspecified: Secondary | ICD-10-CM

## 2015-03-06 DIAGNOSIS — K8020 Calculus of gallbladder without cholecystitis without obstruction: Secondary | ICD-10-CM

## 2015-03-06 DIAGNOSIS — R109 Unspecified abdominal pain: Secondary | ICD-10-CM | POA: Insufficient documentation

## 2015-03-06 NOTE — Assessment & Plan Note (Signed)
Patient with upper abdominal pain, worse in the epigastrium and right upper quadrant. Has seen a surgeon in [place] schedule a laparoscopic cholecystectomy in 8 days. However, the patient does not like the surgeon and would like to be referred to a surgeon in [place]. His artery been prescribed 40 mg of omeprazole daily due to concern for possible GERD. Given his overlying gallbladder disease and impending potential need for cholecystectomy, difficult to tease out component of abdominal pain due to GERD. We'll have him continue his omeprazole, refer to a surgeon in this area, have him return for follow-up in 3 months to further evaluate his symptoms after surgery.

## 2015-03-06 NOTE — Progress Notes (Signed)
Primary Care Physician:  [name], MD Primary Gastroenterologist:  Dr. Oneida Alar  Chief Complaint  Patient presents with  . Abdominal Pain    HPI:   54 year old male presents on referral from primary care for epigastric pain. PCP notes reviewed. Patient has had persistent epigastric pain which is colicky in nature, abdominal ultrasound showed gallbladder polyps versus stones and is currently scheduled for a laparoscopic cholecystectomy in 8 days. Ulcerative history of fatty liver disease. CMP completed at last PCP visit on 02/18/2015 shows normal AST, ALT, alkaline phosphatase, and bilirubin.  Today he states pain started about 2.5 years ago. Has gotten progressively worse in the past 1.5 years. Saw a Psychologist, sport and exercise as [facility] in [place] and is tentatively scheduled with lap chole on 12/22. He states he does not want the surgeon he saw to do the procedure and wants a different surgical referral. Pain is across the upper abdomen, described as burning, piercing, and stabbing. Denies esophageal burning. Occasional exacerbations of belching and bitter taste in his mouth which preceeds nausea and vomiting. Denies hematochezia and melena. Denies chest pain, dyspnea, dizziness, lightheadedness, syncope, near syncope. Denies any other upper or lower GI symptoms.   Past Medical History  Diagnosis Date  . PTSD (post-traumatic stress disorder)   . Coronary artery disease   . Hypercholesterolemia   . Fatty liver   . Hypercholesteremia   . Hypertension   . COPD (chronic obstructive pulmonary disease) [facility])     Past Surgical History  Procedure Laterality Date  . Throat surgery      traumatic injury  . Hand surgery    . Knee surgery    . Shoulder surgery      Current Outpatient Prescriptions  Medication Sig Dispense Refill  . albuterol (PROVENTIL HFA;VENTOLIN HFA) 108 (90 BASE) MCG/ACT inhaler Inhale 2 puffs into the lungs every 6 (six) hours as needed for wheezing or  shortness of breath. 3 Inhaler 3  . amitriptyline (ELAVIL) 50 MG tablet Take 1 tablet (50 mg total) by mouth at bedtime. 30 tablet 1  . busPIRone (BUSPAR) 10 MG tablet Take 1 tablet (10 mg total) by mouth 2 (two) times daily. 60 tablet 1  . ezetimibe-simvastatin (VYTORIN) 10-10 MG per tablet Take 1 tablet by mouth at bedtime. 90 tablet 3  . lamoTRIgine (LAMICTAL) 25 MG tablet Take 2 tablets (50 mg total) by mouth daily. 60 tablet 1  . lisinopril-hydrochlorothiazide (PRINZIDE,ZESTORETIC) 20-25 MG per tablet Take 1 tablet by mouth daily. 90 tablet 3  . omeprazole (PRILOSEC) 40 MG capsule Take 1 capsule (40 mg total) by mouth daily. 30 capsule 2  . ondansetron (ZOFRAN ODT) 4 MG disintegrating tablet Take 1 tablet (4 mg total) by mouth every 8 (eight) hours as needed for nausea or vomiting. (Patient not taking: Reported on 03/06/2015) 20 tablet 0  . oxyCODONE (ROXICODONE) 5 MG immediate release tablet Take 1 tablet (5 mg total) by mouth every 4 (four) hours as needed for severe pain. (Patient not taking: Reported on 03/06/2015) 30 tablet 0   No current facility-administered medications for this visit.    Allergies as of 03/06/2015 - Review Complete 03/06/2015  Allergen Reaction Noted  . Penicillins Anaphylaxis and Swelling 08/03/2012  . Vistaril [hydroxyzine hcl] Hives and Shortness Of Breath 08/19/2013  . Sulfa antibiotics Hives and Itching 02/09/2013  . Lithium Other (See Comments) 06/12/2013  . Tramadol Other (See Comments) 06/12/2013  . Celebrex [celecoxib] Other (See Comments) 02/09/2013  . Neurontin [gabapentin] Nausea And Vomiting and  Rash 02/09/2013  . Soma [carisoprodol] Other (See Comments) 02/09/2013  . Toradol [ketorolac tromethamine] Other (See Comments) 02/09/2013    Family History  Problem Relation Age of Onset  . Alcohol abuse Father   . Anxiety disorder Father   . Depression Father   . Coronary artery disease Father   . Alcohol abuse Brother   . Coronary artery disease  Brother   . Schizophrenia Neg Hx   . Diabetes Mellitus II Neg Hx   . Drug abuse Brother   . Depression Brother 53    Commited suicide   . Cancer Mother 31    ovarian    Social History   Social History  . Marital Status: Single    Spouse Name: N/A  . Number of Children: N/A  . Years of Education: N/A   Occupational History  . Not on file.   Social History Main Topics  . Smoking status: Current Every Day Smoker -- 0.50 packs/day for 30 years    Types: Cigarettes    Start date: 03/24/1983  . Smokeless tobacco: Not on file     Comment: one pack daily  . Alcohol Use: No     Comment: Quit drinking. 2-3 fifths, binge drinking.  . Drug Use: No     Comment: last used x4 days.  . Sexual Activity:    Partners: Female   Other Topics Concern  . Not on file   Social History Narrative    Review of Systems: 10-point ROS negative except as per HPI.    Physical Exam: BP 163/90 mmHg  Pulse 91  Temp(Src) 97.2 F (36.2 C) (Oral)  Ht 5\' 11"  (1.803 m)  Wt 253 lb 12.8 oz (115.123 kg)  BMI 35.41 kg/m2 General:   Alert and oriented. Pleasant and cooperative. Well-nourished and well-developed. Appears in pain. Head:  Normocephalic and atraumatic. Eyes:  Without icterus, sclera clear and conjunctiva pink.  Ears:  Normal auditory acuity. Cardiovascular:  S1, S2 present without murmurs appreciated. Extremities without clubbing or edema. Respiratory:  Clear to auscultation bilaterally. No wheezes, rales, or rhonchi. No distress.  Gastrointestinal:  +BS, soft, and non-distended. Epigastric and RUQ abdominal TTP. No guarding or rebound. No masses appreciated. Umbilical hernia noted soft, non-tender, and easily reducible. Rectal:  Deferred  Skin:  Intact without significant lesions or rashes. Neurologic:  Alert and oriented x4;  grossly normal neurologically. Psych:  Alert and cooperative. Normal mood and affect. Heme/Lymph/Immune: No excessive bruising noted.    03/06/2015 1:29  PM

## 2015-03-06 NOTE — Patient Instructions (Signed)
1. Into taking her omeprazole. 2. We will refer you to a surgeon (Dr. Arnoldo Morale) for further evaluation for possible rescheduling of your surgery. 3. Return for follow-up in 3 months.

## 2015-03-07 NOTE — Progress Notes (Signed)
CC'ED TO PCP 

## 2015-03-11 ENCOUNTER — Encounter (HOSPITAL_COMMUNITY)
Admission: RE | Admit: 2015-03-11 | Discharge: 2015-03-11 | Disposition: A | Discharge status: Home / Self Care | Payer: Medicaid Other | Source: Ambulatory Visit | Attending: Surgery | Admitting: Surgery

## 2015-03-11 ENCOUNTER — Encounter (HOSPITAL_COMMUNITY): Payer: Self-pay

## 2015-03-11 DIAGNOSIS — K8010 Calculus of gallbladder with chronic cholecystitis without obstruction: Secondary | ICD-10-CM | POA: Diagnosis not present

## 2015-03-11 DIAGNOSIS — Z01812 Encounter for preprocedural laboratory examination: Secondary | ICD-10-CM | POA: Insufficient documentation

## 2015-03-11 HISTORY — DX: Unspecified osteoarthritis, unspecified site: M19.90

## 2015-03-11 HISTORY — DX: Anxiety disorder, unspecified: F41.9

## 2015-03-11 HISTORY — DX: Personal history of urinary calculi: Z87.442

## 2015-03-11 HISTORY — DX: Major depressive disorder, single episode, unspecified: F32.9

## 2015-03-11 HISTORY — DX: Depression, unspecified: F32.A

## 2015-03-11 HISTORY — DX: Allergy status to unspecified drugs, medicaments and biological substances: Z88.9

## 2015-03-11 HISTORY — DX: Gastro-esophageal reflux disease without esophagitis: K21.9

## 2015-03-11 HISTORY — DX: Bipolar disorder, unspecified: F31.9

## 2015-03-11 LAB — CBC
HCT: 46.8 % (ref 39.0–52.0)
HEMOGLOBIN: 16.4 g/dL (ref 13.0–17.0)
MCH: 33.1 pg (ref 26.0–34.0)
MCHC: 35 g/dL (ref 30.0–36.0)
MCV: 94.5 fL (ref 78.0–100.0)
PLATELETS: 196 10*3/uL (ref 150–400)
RBC: 4.95 MIL/uL (ref 4.22–5.81)
RDW: 12.9 % (ref 11.5–15.5)
WBC: 9.6 10*3/uL (ref 4.0–10.5)

## 2015-03-11 LAB — BASIC METABOLIC PANEL
ANION GAP: 7 (ref 5–15)
BUN: 8 mg/dL (ref 6–20)
CALCIUM: 9.8 mg/dL (ref 8.9–10.3)
CO2: 28 mmol/L (ref 22–32)
CREATININE: 0.93 mg/dL (ref 0.61–1.24)
Chloride: 104 mmol/L (ref 101–111)
Glucose, Bld: 91 mg/dL (ref 65–99)
Potassium: 4.7 mmol/L (ref 3.5–5.1)
Sodium: 139 mmol/L (ref 135–145)

## 2015-03-11 NOTE — Patient Instructions (Addendum)
Oscar Hall  03/11/2015   Your procedure is scheduled on:   03-14-2015 Thursday  Enter through [place] and follow signs to [facility] to [place]. Arrive at     12 45   PM.  (Limit 1 person with you).  Call this number if you have problems the morning of surgery: [phone]  Or Presurgical Testing [phone] days before.   For Living Will and/or Health Care Power Attorney Forms: please provide copy for your medical record,may bring AM of surgery(Forms should be already notarized -we do not provide this service).( No information preferred today).      Do not eat food/ or drink: After Midnight.  Exception: may have clear liquids:up to 6 Hours before arrival. Nothing after: 0800 AM  Clear liquids include soda, tea, black coffee, apple or grape juice, broth.  Take these medicines the morning of surgery with A SIP OF WATER-   (DO NOT TAKE ANY DIABETIC MEDS AM OF SURGERY) : Buspar. Lamictal. Omeprazole. Use/ bring Inhalers.   Do not wear jewelry, make-up or nail polish.  Do not wear deodorant, lotions, powders, or perfumes.   Do not shave legs and under arms- 48 hours(2 days) prior to first CHG shower.(Shaving face and neck okay.)  Do not bring valuables to the hospital.(Hospital is not responsible for lost valuables).  Contacts, dentures or removable bridgework, body piercing, hair pins may not be worn into surgery.  Leave suitcase in the car. After surgery it may be brought to your room.  For patients admitted to the hospital, checkout time is 11:00 AM the day of discharge.(Restricted visitors-Any Persons displaying flu-like symptoms or illness).    Patients discharged the day of surgery will not be allowed to drive home. Must have responsible person with you x 24 hours once discharged.  Name and phone number of your driver:  Oscar Hall [phone] cell    Please read over the following fact sheets that you were given:   CHG(Chlorhexidine Gluconate 4% Surgical Soap) use, MRSA Information, Blood Transfusion fact sheet, Incentive Spirometry Instruction.  Remember : Type/Screen "Blue armbands" - may not be removed once applied(would result in being retested AM of surgery, if removed).         [facility] - Preparing for Surgery Before surgery, you can play an important role.  Because skin is not sterile, your skin needs to be as free of germs as possible.  You can reduce the number of germs on your skin by washing with CHG (chlorahexidine gluconate) soap before surgery.  CHG is an antiseptic cleaner which kills germs and bonds with the skin to continue killing germs even after washing. Please DO NOT use if you have an allergy to CHG or antibacterial soaps.  If your skin becomes reddened/irritated stop using the CHG and inform your nurse when you arrive at Short Stay. Do not shave (including legs and underarms) for at least 48 hours prior to the first CHG shower.  You may shave your face/neck. Please follow these instructions carefully:  1.  Shower with CHG Soap the night before surgery and the  morning of Surgery.  2.  If you choose to wash your hair, wash your hair first as usual with your  normal  shampoo.  3.  After you shampoo, rinse your hair and body thoroughly to remove the  shampoo.  4.  Use CHG as you would any other liquid soap.  You can apply chg directly  to the skin and wash                       Gently with a scrungie or clean washcloth.  5.  Apply the CHG Soap to your body ONLY FROM THE NECK DOWN.   Do not use on face/ open                           Wound or open sores. Avoid contact with eyes, ears mouth and genitals (private parts).                       Wash face,  Genitals (private parts) with your normal soap.             6.  Wash thoroughly, paying special attention to the area where your surgery  will be performed.  7.  Thoroughly rinse your body with warm water from  the neck down.  8.  DO NOT shower/wash with your normal soap after using and rinsing off  the CHG Soap.                9.  Pat yourself dry with a clean towel.            10.  Wear clean pajamas.            11.  Place clean sheets on your bed the night of your first shower and do not  sleep with pets. Day of Surgery : Do not apply any lotions/deodorants the morning of surgery.  Please wear clean clothes to the hospital/surgery center.  FAILURE TO FOLLOW THESE INSTRUCTIONS MAY RESULT IN THE CANCELLATION OF YOUR SURGERY PATIENT SIGNATURE_________________________________  NURSE SIGNATURE__________________________________  ________________________________________________________________________

## 2015-03-11 NOTE — Pre-Procedure Instructions (Signed)
03-11-15 EKG/ CXR 07-20-14/ CT Chest 4'16 Epic.

## 2015-03-13 MED ORDER — GENTAMICIN SULFATE 40 MG/ML IJ SOLN
5.0000 mg/kg | INTRAVENOUS | Status: AC
  Administered 2015-03-14: 450 mg via INTRAVENOUS
  Filled 2015-03-13 (×2): qty 11.25

## 2015-03-14 ENCOUNTER — Encounter (HOSPITAL_COMMUNITY)
Admission: RE | Disposition: A | Discharge status: Home / Self Care | Payer: Self-pay | Source: Ambulatory Visit | Attending: Surgery

## 2015-03-14 ENCOUNTER — Ambulatory Visit (HOSPITAL_COMMUNITY): Payer: Medicaid Other | Admitting: Anesthesiology

## 2015-03-14 ENCOUNTER — Encounter (HOSPITAL_COMMUNITY): Payer: Self-pay | Admitting: *Deleted

## 2015-03-14 ENCOUNTER — Ambulatory Visit (HOSPITAL_COMMUNITY)
Admission: RE | Admit: 2015-03-14 | Discharge: 2015-03-14 | Disposition: A | Discharge status: Home / Self Care | Payer: Medicaid Other | Source: Ambulatory Visit | Attending: Surgery | Admitting: Surgery

## 2015-03-14 ENCOUNTER — Ambulatory Visit (HOSPITAL_COMMUNITY): Payer: Medicaid Other

## 2015-03-14 DIAGNOSIS — Z419 Encounter for procedure for purposes other than remedying health state, unspecified: Secondary | ICD-10-CM

## 2015-03-14 DIAGNOSIS — I2510 Atherosclerotic heart disease of native coronary artery without angina pectoris: Secondary | ICD-10-CM | POA: Diagnosis not present

## 2015-03-14 DIAGNOSIS — R1011 Right upper quadrant pain: Secondary | ICD-10-CM | POA: Diagnosis present

## 2015-03-14 DIAGNOSIS — K739 Chronic hepatitis, unspecified: Secondary | ICD-10-CM | POA: Insufficient documentation

## 2015-03-14 DIAGNOSIS — F17210 Nicotine dependence, cigarettes, uncomplicated: Secondary | ICD-10-CM | POA: Insufficient documentation

## 2015-03-14 DIAGNOSIS — K420 Umbilical hernia with obstruction, without gangrene: Secondary | ICD-10-CM | POA: Diagnosis not present

## 2015-03-14 DIAGNOSIS — K660 Peritoneal adhesions (postprocedural) (postinfection): Secondary | ICD-10-CM | POA: Diagnosis not present

## 2015-03-14 DIAGNOSIS — K7581 Nonalcoholic steatohepatitis (NASH): Secondary | ICD-10-CM | POA: Insufficient documentation

## 2015-03-14 DIAGNOSIS — F419 Anxiety disorder, unspecified: Secondary | ICD-10-CM | POA: Diagnosis not present

## 2015-03-14 DIAGNOSIS — J449 Chronic obstructive pulmonary disease, unspecified: Secondary | ICD-10-CM | POA: Insufficient documentation

## 2015-03-14 DIAGNOSIS — K811 Chronic cholecystitis: Secondary | ICD-10-CM

## 2015-03-14 DIAGNOSIS — F3130 Bipolar disorder, current episode depressed, mild or moderate severity, unspecified: Secondary | ICD-10-CM | POA: Diagnosis not present

## 2015-03-14 DIAGNOSIS — M1990 Unspecified osteoarthritis, unspecified site: Secondary | ICD-10-CM | POA: Insufficient documentation

## 2015-03-14 DIAGNOSIS — F4310 Post-traumatic stress disorder, unspecified: Secondary | ICD-10-CM | POA: Diagnosis present

## 2015-03-14 DIAGNOSIS — K219 Gastro-esophageal reflux disease without esophagitis: Secondary | ICD-10-CM | POA: Insufficient documentation

## 2015-03-14 DIAGNOSIS — I10 Essential (primary) hypertension: Secondary | ICD-10-CM | POA: Diagnosis not present

## 2015-03-14 DIAGNOSIS — G894 Chronic pain syndrome: Secondary | ICD-10-CM | POA: Diagnosis present

## 2015-03-14 DIAGNOSIS — F3113 Bipolar disorder, current episode manic without psychotic features, severe: Secondary | ICD-10-CM | POA: Diagnosis present

## 2015-03-14 HISTORY — PX: CHOLECYSTECTOMY: SHX55

## 2015-03-14 HISTORY — PX: VENTRAL HERNIA REPAIR: SHX424

## 2015-03-14 HISTORY — DX: Unspecified place in unspecified non-institutional (private) residence as the place of occurrence of the external cause: Y92.009

## 2015-03-14 HISTORY — PX: DIAGNOSTIC LAPAROSCOPIC LIVER BIOPSY: SHX5797

## 2015-03-14 HISTORY — DX: Unspecified fall, initial encounter: W19.XXXA

## 2015-03-14 SURGERY — LAPAROSCOPIC CHOLECYSTECTOMY WITH INTRAOPERATIVE CHOLANGIOGRAM
Anesthesia: General | Site: Abdomen

## 2015-03-14 MED ORDER — MIDAZOLAM HCL 5 MG/5ML IJ SOLN
INTRAMUSCULAR | Status: DC | PRN
  Administered 2015-03-14: 2 mg via INTRAVENOUS

## 2015-03-14 MED ORDER — LIDOCAINE HCL (CARDIAC) 20 MG/ML IV SOLN
INTRAVENOUS | Status: AC
  Filled 2015-03-14: qty 5

## 2015-03-14 MED ORDER — SODIUM CHLORIDE 0.9 % IJ SOLN: 3.0000 mL | INTRAMUSCULAR | Status: DC | PRN

## 2015-03-14 MED ORDER — BUPIVACAINE LIPOSOME 1.3 % IJ SUSP
20.0000 mL | Freq: Once | INTRAMUSCULAR | Status: AC
  Administered 2015-03-14: 20 mL
  Filled 2015-03-14: qty 20

## 2015-03-14 MED ORDER — OXYCODONE HCL 5 MG PO TABS
5.0000 mg | ORAL_TABLET | ORAL | Status: DC | PRN
  Administered 2015-03-14: 10 mg via ORAL

## 2015-03-14 MED ORDER — SODIUM CHLORIDE 0.9 % IV SOLN: 250.0000 mL | INTRAVENOUS | Status: DC | PRN

## 2015-03-14 MED ORDER — DEXAMETHASONE SODIUM PHOSPHATE 10 MG/ML IJ SOLN
INTRAMUSCULAR | Status: AC
  Filled 2015-03-14: qty 1

## 2015-03-14 MED ORDER — MIDAZOLAM HCL 2 MG/2ML IJ SOLN
INTRAMUSCULAR | Status: AC
  Filled 2015-03-14: qty 2

## 2015-03-14 MED ORDER — PROPOFOL 10 MG/ML IV BOLUS
INTRAVENOUS | Status: AC
  Filled 2015-03-14: qty 20

## 2015-03-14 MED ORDER — IPRATROPIUM-ALBUTEROL 0.5-2.5 (3) MG/3ML IN SOLN
3.0000 mL | Freq: Four times a day (QID) | RESPIRATORY_TRACT | Status: DC
  Administered 2015-03-14: 3 mL via RESPIRATORY_TRACT
  Filled 2015-03-14 (×7): qty 3

## 2015-03-14 MED ORDER — OXYCODONE HCL 5 MG PO TABS
ORAL_TABLET | ORAL | Status: AC
  Filled 2015-03-14: qty 1

## 2015-03-14 MED ORDER — FENTANYL CITRATE (PF) 100 MCG/2ML IJ SOLN: 25.0000 ug | INTRAMUSCULAR | Status: DC | PRN

## 2015-03-14 MED ORDER — CHLORHEXIDINE GLUCONATE 4 % EX LIQD: 1.0000 "application " | Freq: Once | CUTANEOUS | Status: DC

## 2015-03-14 MED ORDER — ROCURONIUM BROMIDE 100 MG/10ML IV SOLN
INTRAVENOUS | Status: DC | PRN
  Administered 2015-03-14: 40 mg via INTRAVENOUS
  Administered 2015-03-14 (×3): 10 mg via INTRAVENOUS

## 2015-03-14 MED ORDER — HYDROMORPHONE HCL 2 MG/ML IJ SOLN
INTRAMUSCULAR | Status: AC
  Filled 2015-03-14: qty 1

## 2015-03-14 MED ORDER — CLINDAMYCIN PHOSPHATE 900 MG/50ML IV SOLN
INTRAVENOUS | Status: AC
  Filled 2015-03-14: qty 50

## 2015-03-14 MED ORDER — ROCURONIUM BROMIDE 100 MG/10ML IV SOLN
INTRAVENOUS | Status: AC
  Filled 2015-03-14: qty 1

## 2015-03-14 MED ORDER — PROPOFOL 10 MG/ML IV BOLUS
INTRAVENOUS | Status: DC | PRN
  Administered 2015-03-14: 200 mg via INTRAVENOUS

## 2015-03-14 MED ORDER — PROMETHAZINE HCL 12.5 MG PO TABS: 12.5000 mg | ORAL_TABLET | Freq: Four times a day (QID) | ORAL | Status: DC | PRN

## 2015-03-14 MED ORDER — HYDROMORPHONE HCL 1 MG/ML IJ SOLN
0.2500 mg | INTRAMUSCULAR | Status: DC | PRN
  Administered 2015-03-14 (×4): 0.5 mg via INTRAVENOUS

## 2015-03-14 MED ORDER — SODIUM CHLORIDE 0.9 % IJ SOLN
INTRAMUSCULAR | Status: DC | PRN
  Administered 2015-03-14: 60 mL

## 2015-03-14 MED ORDER — ONDANSETRON HCL 4 MG/2ML IJ SOLN
INTRAMUSCULAR | Status: AC
  Filled 2015-03-14: qty 2

## 2015-03-14 MED ORDER — OXYCODONE HCL 5 MG PO TABS: 5.0000 mg | ORAL_TABLET | ORAL | Status: DC | PRN

## 2015-03-14 MED ORDER — SUGAMMADEX SODIUM 500 MG/5ML IV SOLN
INTRAVENOUS | Status: DC | PRN
  Administered 2015-03-14: 200 mg via INTRAVENOUS

## 2015-03-14 MED ORDER — LIDOCAINE HCL (CARDIAC) 20 MG/ML IV SOLN
INTRAVENOUS | Status: DC | PRN
  Administered 2015-03-14: 80 mg via INTRATRACHEAL

## 2015-03-14 MED ORDER — LABETALOL HCL 5 MG/ML IV SOLN
INTRAVENOUS | Status: DC | PRN
  Administered 2015-03-14 (×4): 5 mg via INTRAVENOUS

## 2015-03-14 MED ORDER — CLINDAMYCIN PHOSPHATE 900 MG/50ML IV SOLN
900.0000 mg | INTRAVENOUS | Status: AC
  Administered 2015-03-14: 900 mg via INTRAVENOUS

## 2015-03-14 MED ORDER — FENTANYL CITRATE (PF) 250 MCG/5ML IJ SOLN
INTRAMUSCULAR | Status: DC | PRN
  Administered 2015-03-14 (×3): 50 ug via INTRAVENOUS
  Administered 2015-03-14: 100 ug via INTRAVENOUS

## 2015-03-14 MED ORDER — ONDANSETRON HCL 4 MG/2ML IJ SOLN
INTRAMUSCULAR | Status: DC | PRN
  Administered 2015-03-14: 4 mg via INTRAVENOUS

## 2015-03-14 MED ORDER — LABETALOL HCL 5 MG/ML IV SOLN
INTRAVENOUS | Status: AC
  Filled 2015-03-14: qty 4

## 2015-03-14 MED ORDER — HYDROMORPHONE HCL 1 MG/ML IJ SOLN
INTRAMUSCULAR | Status: AC
  Administered 2015-03-14: 0.5 mg via INTRAVENOUS
  Filled 2015-03-14: qty 1

## 2015-03-14 MED ORDER — LACTATED RINGERS IR SOLN
Status: DC | PRN
  Administered 2015-03-14: 2000 mL

## 2015-03-14 MED ORDER — GLYCOPYRROLATE 0.2 MG/ML IJ SOLN
INTRAMUSCULAR | Status: AC
  Filled 2015-03-14: qty 3

## 2015-03-14 MED ORDER — 0.9 % SODIUM CHLORIDE (POUR BTL) OPTIME
TOPICAL | Status: DC | PRN
  Administered 2015-03-14: 1000 mL

## 2015-03-14 MED ORDER — BUPIVACAINE-EPINEPHRINE 0.25% -1:200000 IJ SOLN
INTRAMUSCULAR | Status: DC | PRN
  Administered 2015-03-14: 40 mL

## 2015-03-14 MED ORDER — SODIUM CHLORIDE 0.9 % IJ SOLN: 3.0000 mL | Freq: Two times a day (BID) | INTRAMUSCULAR | Status: DC

## 2015-03-14 MED ORDER — BUPIVACAINE-EPINEPHRINE 0.25% -1:200000 IJ SOLN
INTRAMUSCULAR | Status: AC
  Filled 2015-03-14: qty 2

## 2015-03-14 MED ORDER — SUCCINYLCHOLINE CHLORIDE 20 MG/ML IJ SOLN
INTRAMUSCULAR | Status: DC | PRN
  Administered 2015-03-14: 100 mg via INTRAVENOUS

## 2015-03-14 MED ORDER — HYDROMORPHONE HCL 1 MG/ML IJ SOLN
INTRAMUSCULAR | Status: DC | PRN
  Administered 2015-03-14: .4 mg via INTRAVENOUS
  Administered 2015-03-14: .8 mg via INTRAVENOUS
  Administered 2015-03-14 (×2): 1 mg via INTRAVENOUS
  Administered 2015-03-14 (×2): .4 mg via INTRAVENOUS

## 2015-03-14 MED ORDER — LACTATED RINGERS IV SOLN
INTRAVENOUS | Status: DC
  Administered 2015-03-14: 17:00:00 via INTRAVENOUS
  Administered 2015-03-14: 1000 mL via INTRAVENOUS

## 2015-03-14 MED ORDER — IOHEXOL 300 MG/ML  SOLN
INTRAMUSCULAR | Status: DC | PRN
  Administered 2015-03-14: 8.5 mL

## 2015-03-14 MED ORDER — STERILE WATER FOR IRRIGATION IR SOLN
Status: DC | PRN
  Administered 2015-03-14: 1500 mL

## 2015-03-14 MED ORDER — SODIUM CHLORIDE 0.9 % IJ SOLN
INTRAMUSCULAR | Status: AC
  Filled 2015-03-14: qty 10

## 2015-03-14 MED ORDER — OXYCODONE HCL 5 MG PO TABS: 5.0000 mg | ORAL_TABLET | Freq: Once | ORAL | Status: DC

## 2015-03-14 MED ORDER — DEXAMETHASONE SODIUM PHOSPHATE 10 MG/ML IJ SOLN
INTRAMUSCULAR | Status: DC | PRN
  Administered 2015-03-14: 10 mg via INTRAVENOUS

## 2015-03-14 MED ORDER — FENTANYL CITRATE (PF) 250 MCG/5ML IJ SOLN
INTRAMUSCULAR | Status: AC
  Filled 2015-03-14: qty 5

## 2015-03-14 MED ORDER — PROMETHAZINE HCL 25 MG/ML IJ SOLN: 6.2500 mg | INTRAMUSCULAR | Status: DC | PRN

## 2015-03-14 MED ORDER — SUGAMMADEX SODIUM 200 MG/2ML IV SOLN
INTRAVENOUS | Status: AC
  Filled 2015-03-14: qty 2

## 2015-03-14 SURGICAL SUPPLY — 42 items
APPLIER CLIP 5 13 M/L LIGAMAX5 (MISCELLANEOUS) ×3
APPLIER CLIP ROT 10 11.4 M/L (STAPLE)
APR CLP MED LRG 11.4X10 (STAPLE)
APR CLP MED LRG 5 ANG JAW (MISCELLANEOUS) ×1
BAG SPEC RTRVL LRG 6X4 10 (ENDOMECHANICALS)
CABLE HIGH FREQUENCY MONO STRZ (ELECTRODE) ×2 IMPLANT
CLIP APPLIE 5 13 M/L LIGAMAX5 (MISCELLANEOUS) IMPLANT
CLIP APPLIE ROT 10 11.4 M/L (STAPLE) IMPLANT
COVER MAYO STAND STRL (DRAPES) ×3 IMPLANT
COVER SURGICAL LIGHT HANDLE (MISCELLANEOUS) ×3 IMPLANT
DECANTER SPIKE VIAL GLASS SM (MISCELLANEOUS) ×3 IMPLANT
DRAPE C-ARM 42X120 X-RAY (DRAPES) ×3 IMPLANT
DRAPE LAPAROSCOPIC ABDOMINAL (DRAPES) ×3 IMPLANT
DRAPE UTILITY XL STRL (DRAPES) ×3 IMPLANT
DRAPE WARM FLUID 44X44 (DRAPE) ×3 IMPLANT
DRSG TEGADERM 2-3/8X2-3/4 SM (GAUZE/BANDAGES/DRESSINGS) ×5 IMPLANT
DRSG TEGADERM 4X4.75 (GAUZE/BANDAGES/DRESSINGS) ×3 IMPLANT
ELECT REM PT RETURN 9FT ADLT (ELECTROSURGICAL) ×3
ELECTRODE REM PT RTRN 9FT ADLT (ELECTROSURGICAL) ×1 IMPLANT
ENDOLOOP SUT PDS II  0 18 (SUTURE)
ENDOLOOP SUT PDS II 0 18 (SUTURE) IMPLANT
GLOVE ECLIPSE 8.0 STRL XLNG CF (GLOVE) ×3 IMPLANT
GLOVE INDICATOR 8.0 STRL GRN (GLOVE) ×3 IMPLANT
GOWN STRL REUS W/TWL XL LVL3 (GOWN DISPOSABLE) ×6 IMPLANT
KIT BASIN OR (CUSTOM PROCEDURE TRAY) ×3 IMPLANT
NDL BIOPSY 14X6 SOFT TISS (NEEDLE) IMPLANT
NEEDLE BIOPSY 14X6 SOFT TISS (NEEDLE) ×3 IMPLANT
PAD TELFA 2X3 NADH STRL (GAUZE/BANDAGES/DRESSINGS) ×2 IMPLANT
POUCH SPECIMEN RETRIEVAL 10MM (ENDOMECHANICALS) IMPLANT
SCISSORS LAP 5X35 DISP (ENDOMECHANICALS) ×3 IMPLANT
SET CHOLANGIOGRAPH MIX (MISCELLANEOUS) ×3 IMPLANT
SET IRRIG TUBING LAPAROSCOPIC (IRRIGATION / IRRIGATOR) ×3 IMPLANT
SHEARS CURVED HARMONIC AC 45CM (MISCELLANEOUS) ×2 IMPLANT
SLEEVE XCEL OPT CAN 5 100 (ENDOMECHANICALS) IMPLANT
SUT MNCRL AB 4-0 PS2 18 (SUTURE) ×3 IMPLANT
SUT PDS AB 1 CT1 27 (SUTURE) ×8 IMPLANT
SYR 20CC LL (SYRINGE) ×3 IMPLANT
TOWEL OR 17X26 10 PK STRL BLUE (TOWEL DISPOSABLE) ×3 IMPLANT
TOWEL OR NON WOVEN STRL DISP B (DISPOSABLE) ×3 IMPLANT
TRAY LAPAROSCOPIC (CUSTOM PROCEDURE TRAY) ×3 IMPLANT
TROCAR BLADELESS OPT 5 100 (ENDOMECHANICALS) ×3 IMPLANT
TROCAR XCEL NON-BLD 11X100MML (ENDOMECHANICALS) ×3 IMPLANT

## 2015-03-14 NOTE — Interval H&P Note (Signed)
History and Physical Interval Note:  03/14/2015 2:48 PM  Oscar Hall  has presented today for surgery, with the diagnosis of Symptomatic biliary colic, probable chronic cholecystitis, incarcerated periumbilical ventral wall hernia  The various methods of treatment have been discussed with the patient and family. After consideration of risks, benefits and other options for treatment, the patient has consented to  Procedure(s) with comments: LAPAROSCOPIC CHOLECYSTECTOMY WITH INTRAOPERATIVE CHOLANGIOGRAM (N/A) REPAIR OF INCARCERATED PERIUMBILICAL VENTRAL WALL HERNIA (N/A) - only pull gallbladder card, pull ventral supplies separate as a surgical intervention .  The patient's history has been reviewed, patient examined, no change in status, stable for surgery.  I have reviewed the patient's chart and labs.  Questions were answered to the patient's satisfaction.     Andrick Rust C.

## 2015-03-14 NOTE — Progress Notes (Signed)
Pt awake and alert.  Tolerated crackers and gingerale. Pt ambulated unassisted to bathroom and voided.  Reports pain unrelieved and informed pt Dr Johney Maine ordered ROXICODONE.  Pt reported he takes 5 mg every 4 hours and it is like water.  Dr Jillyn Hidden notified and pt received 10 mg ROXICODONE.  Pt became increasingly agitated about unrelieved pain all over his body for last 2 and one half years.  Dr Jillyn Hidden called and assessed pt.  PT decided to just go home, agitated and walked from PACU to car unassisted.  Ambulated with ease.  Wife received instructuions and denied any questions.  Spouse reported they would be fine at home, and she would call Dr Johney Maine if needed.

## 2015-03-14 NOTE — Anesthesia Procedure Notes (Signed)
Procedure Name: Intubation Date/Time: 03/14/2015 3:38 PM Performed by: Dione Booze Pre-anesthesia Checklist: Patient identified, Emergency Drugs available, Suction available and Patient being monitored Patient Re-evaluated:Patient Re-evaluated prior to inductionOxygen Delivery Method: Circle system utilized Preoxygenation: Pre-oxygenation with 100% oxygen Intubation Type: IV induction Laryngoscope Size: Mac and 4 Grade View: Grade I Tube type: Oral Tube size: 7.5 mm Number of attempts: 1 Airway Equipment and Method: Stylet Placement Confirmation: ETT inserted through vocal cords under direct vision and positive ETCO2 Secured at: 22 cm Tube secured with: Tape Dental Injury: Teeth and Oropharynx as per pre-operative assessment

## 2015-03-14 NOTE — Anesthesia Preprocedure Evaluation (Addendum)
Anesthesia Evaluation  Patient identified by MRN, date of birth, ID band Patient awake    Reviewed: Allergy & Precautions, H&P , NPO status , Patient's Chart, lab work & pertinent test results  History of Anesthesia Complications Negative for: history of anesthetic complications  Airway Mallampati: II  TM Distance: >3 FB Neck ROM: full    Dental no notable dental hx.    Pulmonary COPD, Current Smoker,    Pulmonary exam normal breath sounds clear to auscultation       Cardiovascular hypertension, + CAD  Normal cardiovascular exam Rhythm:regular Rate:Normal  Denies cardiac symptoms or history of heart stents   Neuro/Psych PSYCHIATRIC DISORDERS Anxiety Depression Bipolar Disorder PTSDnegative neurological ROS     GI/Hepatic Neg liver ROS, GERD  ,(+)       alcohol use,   Endo/Other  negative endocrine ROS  Renal/GU negative Renal ROS     Musculoskeletal  (+) Arthritis , Osteoarthritis,    Abdominal   Peds  Hematology negative hematology ROS (+)   Anesthesia Other Findings   Reproductive/Obstetrics negative OB ROS                            Anesthesia Physical Anesthesia Plan  ASA: III  Anesthesia Plan: General   Post-op Pain Management:    Induction: Intravenous  Airway Management Planned: Oral ETT  Additional Equipment:   Intra-op Plan:   Post-operative Plan: Extubation in OR  Informed Consent: I have reviewed the patients History and Physical, chart, labs and discussed the procedure including the risks, benefits and alternatives for the proposed anesthesia with the patient or authorized representative who has indicated his/her understanding and acceptance.   Dental Advisory Given  Plan Discussed with: Anesthesiologist, CRNA and Surgeon  Anesthesia Plan Comments:        Anesthesia Quick Evaluation

## 2015-03-14 NOTE — H&P (View-Only) (Signed)
Oscar Hall 03/01/2015 2:33 PM Location: [facility] Patient #: [id number] DOB: December 30, 1960 Married / Language: Oscar Hall / Race: White Male  History of Present Illness Oscar Hector MD; 03/01/2015 3:30 PM) Patient words: reck.  The patient is a 54 year old male who presents for evaluation of gall stones. Note for "Gall stones": Patient sent by Dr. Domenic Hall over concerns of biliary colic with impacted gallstones versus gallbladder polyps  [name] smoking male with history of PTSD bipolar disorder. He was intermittent episodes of nausea and vomiting and upper abdominal pain. Usually triggered by greasy foods. Been going on for 2 and a half years. And ultrasounds that were unrevealing for gallbladder problems. However ultrasound last month showed some filling defects suspicious for impacted polyps versus stones. Surgical consultation requested. Also has an obvious lump near his bellybutton. He recalls having discussions with medical doctors had any pain and [name] . He was told that it really wasn't hernia . Told surgery was cosmetic surgery. CT Scan in 2015 confirms hernia containing omental fat.  Oscar Hall questions he had heartburn or reflux but does not feel that that has helped as much. Normally moves his bowels about every day. Can walk about 10:15 minutes before he has to stop. His heart attacks. He's gone back down from 3 packs a day to half pack a day. The room smells like it. He was due to see with gastroenterology for bowel issues but given this appointment first. There is discussion about getting a colonoscopy. He's never had abdominal surgery. Recalls being told he had fatty change in his liver. No cirrhosis. Any drinking but some concern of smell as well.  He doesn't have pain right now but is struggling often. He wondered if I could give him some narcotic medicine to tide him over since he has problems with nonsteroidals and other issues. He received some  from his primary care doctor. I noted I would rather just admit him and do surgery if his pain is so bad he is needing narcotics regularly . He did not seem interested in that and told he could wait until January. Seem quite angry that I would not give some to him right now      CLINICAL DATA: Right upper quadrant pain for 2 weeks EXAM: ULTRASOUND ABDOMEN COMPLETE COMPARISON: 09/14/2013 FINDINGS: Gallbladder: The gallbladder is well distended with multiple non mobile echogenicities likely representing small gallbladder polyps or adherent stones. Gallbladder wall is mildly thickened at 3.6 mm. No pericholecystic fluid is seen. Negative sonographic Percell Hall sign is noted. Common bile duct: Diameter: 4.7 mm. Liver: No focal lesion identified. Within normal limits in parenchymal echogenicity. IVC: No abnormality visualized. Pancreas: Not well visualized due to overlying bowel gas Spleen: Within normal limits with the exception of a small splenic granuloma. Right Kidney: Length: 12.3 cm. Echogenicity within normal limits. No mass or hydronephrosis visualized. Left Kidney: Length: 15.1 cm. Fullness of the pelvis is noted without significant ureteral dilatation. This likely represents an extrarenal pelvis. Abdominal aorta: No aneurysm visualized. Other findings: None. IMPRESSION: Multiple gallbladder polyps versus adherent stones. Mild gallbladder wall thickening is noted. Clinical correlation is recommended. Mildly prominent extrarenal pelvis on the left. No other focal abnormality is noted. Electronically Signed By: Oscar Hall M.D. On: 02/04/2015 11:12   Result Notes Notes Recorded by Oscar Rancher, MD on 02/04/2015 at 12:41 PM Patient has multiple gallbladder polyps seen on ultrasound and needs to be referred to general surgery. Oscar Pina, MD [facility] 02/04/2015, 12:41  PM    Allergies (Oscar Hall, CMA; 03/01/2015 2:35  PM) PenicillAMINE *ASSORTED CLASSES* Vistaril *ANTIANXIETY AGENTS* Sulfa Antibiotics Lithium Bromide *CHEMICALS* TraMADol HCl *CHEMICALS* CeleBREX *ANALGESICS - ANTI-INFLAMMATORY* Neurontin *ANTICONVULSANTS* Toradol *ANALGESICS - ANTI-INFLAMMATORY* Soma Compound *MUSCULOSKELETAL THERAPY AGENTS*  Medication History (Oscar Hall, CMA; 03/01/2015 2:37 PM) Amitriptyline HCl (50MG  Tablet, Oral) Active. Lisinopril-Hydrochlorothiazide (20-25MG  Tablet, Oral) Active. Ventolin HFA (108 (90 Base)MCG/ACT Aerosol Soln, Inhalation) Active. Albuterol Sulfate (108 (90 Base)MCG/ACT Aero Pow Br Act, Inhalation) Active. LaMICtal (25MG  Tablet Chewable, Oral) Active. Omeprazole (40MG  Capsule DR, Oral) Active. Medications Reconciled    Vitals (Oscar Hall CMA; 03/01/2015 2:34 PM) 03/01/2015 2:33 PM Weight: 249 lb Height: 71in Body Surface Area: 2.31 m Body Mass Index: 34.73 kg/m  Temp.: 25F(Temporal)  Pulse: 81 (Regular)  BP: 136/80 (Sitting, Left Arm, Standard)      Physical Exam Oscar Hector MD; 03/01/2015 3:22 PM)  General Mental Status-Alert. General Appearance-Not in acute distress, Not Sickly. Orientation-Oriented X3. Hydration-Well hydrated. Voice-Normal.  Integumentary Global Assessment Upon inspection and palpation of skin surfaces of the - Axillae: non-tender, no inflammation or ulceration, no drainage. and Distribution of scalp and body hair is normal. General Characteristics Temperature - normal warmth is noted.  Head and Neck Head-normocephalic, atraumatic with no lesions or palpable masses. Face Global Assessment - atraumatic, no absence of expression. Neck Global Assessment - no abnormal movements, no bruit auscultated on the right, no bruit auscultated on the left, no decreased range of motion, non-tender. Trachea-midline. Thyroid Gland Characteristics - non-tender.  Eye Eyeball - Left-Extraocular movements intact,  No Nystagmus. Eyeball - Right-Extraocular movements intact, No Nystagmus. Cornea - Left-No Hazy. Cornea - Right-No Hazy. Sclera/Conjunctiva - Left-No scleral icterus, No Discharge. Sclera/Conjunctiva - Right-No scleral icterus, No Discharge. Pupil - Left-Direct reaction to light normal. Pupil - Right-Direct reaction to light normal.  ENMT Ears Pinna - Left - no drainage observed, no generalized tenderness observed. Right - no drainage observed, no generalized tenderness observed. Nose and Sinuses External Inspection of the Nose - no destructive lesion observed. Inspection of the nares - Left - quiet respiration. Right - quiet respiration. Mouth and Throat Lips - Upper Lip - no fissures observed, no pallor noted. Lower Lip - no fissures observed, no pallor noted. Nasopharynx - no discharge present. Oral Cavity/Oropharynx - Tongue - no dryness observed. Oral Mucosa - no cyanosis observed. Hypopharynx - no evidence of airway distress observed.  Chest and Lung Exam Inspection Movements - Normal and Symmetrical. Accessory muscles - No use of accessory muscles in breathing. Palpation Palpation of the chest reveals - Non-tender. Auscultation Breath sounds - Normal and Clear.  Cardiovascular Auscultation Rhythm - Regular. Murmurs & Other Heart Sounds - Auscultation of the heart reveals - No Murmurs and No Systolic Clicks.  Abdomen Inspection Inspection of the abdomen reveals - No Visible peristalsis and No Abnormal pulsations. Umbilicus - No Bleeding, No Urine drainage. Palpation/Percussion Palpation and Percussion of the abdomen reveal - Soft, Non Tender, No Rebound tenderness, No Rigidity (guarding) and No Cutaneous hyperesthesia. Note: Obese but soft. Centimeter LEFT paramedian mass soft and somewhat reducible consistent with incarcerated hernia. Guarding. No Murphy sign. Some discomfort and upper abdomen more right-sided  Male Genitourinary Sexual Maturity Tanner 5 -  Adult hair pattern and Adult penile size and shape.  Peripheral Vascular Upper Extremity Inspection - Left - No Cyanotic nailbeds, Not Ischemic. Right - No Cyanotic nailbeds, Not Ischemic.  Neurologic Neurologic evaluation reveals -normal attention span and ability to concentrate, able to name objects and repeat phrases.  Appropriate fund of knowledge , normal sensation and normal coordination. Mental Status Affect - not angry, not paranoid. Cranial Nerves-Normal Bilaterally. Gait-Normal.  Neuropsychiatric Mental status exam performed with findings of-able to articulate well with normal speech/language, rate, volume and coherence, thought content normal with ability to perform basic computations and apply abstract reasoning and no evidence of hallucinations, delusions, obsessions or homicidal/suicidal ideation. Note: Restless. Rubs bleeding point on hand. I put a Band-Aid on it. That seemed to calm him down. Tends to avoid eye contact.  Musculoskeletal Global Assessment Spine, Ribs and Pelvis - no instability, subluxation or laxity. Right Upper Extremity - no instability, subluxation or laxity.  Lymphatic Head & Neck  General Head & Neck Lymphatics: Bilateral - Description - No Localized lymphadenopathy. Axillary  General Axillary Region: Bilateral - Description - No Localized lymphadenopathy. Femoral & Inguinal  Generalized Femoral & Inguinal Lymphatics: Left - Description - No Localized lymphadenopathy. Right - Description - No Localized lymphadenopathy.    Assessment & Plan Oscar Hector MD; 03/01/2015 3:59 PM)  CHRONIC CHOLECYSTITIS WITH CALCULUS (K80.10) Impression: Pretty miserable with intermittent episodes of biliary colic. I think he would benefit from cholecystectomy. Could reduce and are primarily repair the incarcerated hernia at the same time since the defect does not seem to a particularly wide on CT scan. I wonder she's had chronic biliary dyskinesia and  only now is developing stones.  At one point he was asking for pain medications and then another point he said he's had this for 2 and a half years so he can wait until next month. Offered to admit him and get it done now if his pain is truly that bad, but he didn't feel it was at that at this time. He was annoyed that I wouldn't give him more narcotic pills, but then said he could get prescription somewhere else. Would be best to keep his prescriptions through one Dr. only, his primary care physician. Certainly can give him pain medications postoperatively. A lot of issues with sounds like agaoraphobia & PTSD - He gets worked up. Patient has a lot of intolerances and allergies to numerous medications . His options are limited. His wife seems to help console him now.  He seemed too annoyed to consider surgery, then he agreed to proceed with surgery. I will put orders in and see if he proceeds.  Current Plans You are being scheduled for surgery - Our schedulers will call you.  You should hear from our office's scheduling department within 5 working days about the location, date, and time of surgery. We try to make accommodations for patient's preferences in scheduling surgery, but sometimes the OR schedule or the surgeon's schedule prevents [place] from making those accommodations.  If you have not heard from our office [phone]) in 5 working days, call the office and ask for your surgeon's nurse.  If you have other questions about your diagnosis, plan, or surgery, call the office and ask for your surgeon's nurse.  The anatomy & physiology of hepatobiliary & pancreatic function was discussed. The pathophysiology of gallbladder dysfunction was discussed. Natural history risks without surgery was discussed. I feel the risks of no intervention will lead to serious problems that outweigh the operative risks; therefore, I recommended cholecystectomy to remove the pathology. I explained laparoscopic  techniques with possible need for an open approach. Probable cholangiogram to evaluate the bilary tract was explained as well.  Risks such as bleeding, infection, abscess, leak, injury to other organs, need for further treatment,  heart attack, death, and other risks were discussed. I noted a good likelihood this will help address the problem. Possibility that this will not correct all abdominal symptoms was explained. Goals of post-operative recovery were discussed as well. We will work to minimize complications. An educational handout further explaining the pathology and treatment options was given as well. Questions were answered. The patient expresses understanding & wishes to proceed with surgery.  Pt Education - Pamphlet Given - Laparoscopic Gallbladder Surgery: discussed with patient and provided information. Written instructions provided Pt Education - [facility] Laparosopic Post Op HCI (Calley Drenning) Pt Education - [place] (Briley Sulton) Pt Education - Laparoscopic Cholecystectomy: gallbladder Pt Education - [facility] Pain Control (Markevius Trombetta) INCARCERATED VENTRAL HERNIA (K46.0) Impression: He definitely has a hernia by CT scan exam. Does not seem like the fascial defect is at large. Can reduce that remove the gallbladder through that and primarily repair it. That might help some issues. Will not place mesh and a clean contaminated case such as a cholecystectomy. The patient seems afraid of using any mesh anyway.  Current Plans The anatomy & physiology of the abdominal wall was discussed. The pathophysiology of hernias was discussed. Natural history risks without surgery including progeressive enlargement, pain, incarceration, & strangulation was discussed. Contributors to complications such as smoking, obesity, diabetes, prior surgery, etc were discussed.  I feel the risks of no intervention will lead to serious problems that outweigh the operative risks; therefore, I recommended surgery to reduce and repair  the hernia. I explained laparoscopic techniques with possible need for an open approach. I noted the probable use of mesh to patch and/or buttress the hernia repair  Risks such as bleeding, infection, abscess, need for further treatment, heart attack, death, and other risks were discussed. I noted a good likelihood this will help address the problem. Goals of post-operative recovery were discussed as well. Possibility that this will not correct all symptoms was explained. I stressed the importance of low-impact activity, aggressive pain control, avoiding constipation, & not pushing through pain to minimize risk of post-operative chronic pain or injury. Possibility of reherniation especially with smoking, obesity, diabetes, immunosuppression, and other health conditions was discussed. We will work to minimize complications.  An educational handout further explaining the pathology & treatment options was given as well. Questions were answered. The patient expresses understanding & wishes to proceed with surgery.  Pt Education - Pamphlet Given - Laparoscopic Hernia Repair: discussed with patient and provided information. Pt Education - [facility] Hernia Post-Op HCI (Leisl Spurrier): discussed with patient and provided information. RIGHT INGUINAL HERNIA (K40.90) Impression: He could benefit from surgery at some point. Not acutely symptomatic and not the worst of his problems. He seems set against using any mesh anyway. We'll table this issue until he can get the gallbladder issues under control.  TOBACCO ABUSE COUNSELING (Z71.6) Impression: I'm glad he's got back from 3 packs to just a half pack. I encouraged him to completely quit smoking to help lower his chance of hernia recurrence or other operative issues  Oscar Hall, M.D., F.A.C.S. Gastrointestinal and Minimally Invasive Surgery [facility] [address] [place], [place] [address] [phone] Main / Paging

## 2015-03-14 NOTE — Transfer of Care (Signed)
Immediate Anesthesia Transfer of Care Note  Patient: Oscar Hall  Procedure(s) Performed: Procedure(s) with comments: LAPAROSCOPIC SINGLE SITE CHOLECYSTECTOMY WITH INTRAOPERATIVE CHOLANGIOGRAM (N/A) PRIMARY REPAIR OF INCARCERATED VENTRAL HERNIA (N/A) - only pull gallbladder card, pull ventral supplies separate LAPAROSCOPIC LIVER BIOPSY (N/A)  Patient Location: PACU  Anesthesia Type:General  Level of Consciousness: sedated  Airway & Oxygen Therapy: Patient Spontanous Breathing and Patient connected to face mask oxygen  Post-op Assessment: Report given to RN and Post -op Vital signs reviewed and stable  Post vital signs: Reviewed and stable  Last Vitals:  Filed Vitals:   03/14/15 1350  BP: 163/88  Pulse: 102  Temp: 36.7 C  Resp: 18    Complications: No apparent anesthesia complications

## 2015-03-14 NOTE — Progress Notes (Signed)
Dr Johney Maine reports pt does NOT have to void prior to discharge.

## 2015-03-14 NOTE — Anesthesia Postprocedure Evaluation (Addendum)
Anesthesia Post Note  Patient: Oscar Hall  Procedure(s) Performed: Procedure(s) (LRB): LAPAROSCOPIC SINGLE SITE CHOLECYSTECTOMY WITH INTRAOPERATIVE CHOLANGIOGRAM (N/A) PRIMARY REPAIR OF INCARCERATED VENTRAL HERNIA (N/A) LAPAROSCOPIC LIVER BIOPSY (N/A)  Patient location during evaluation: PACU Anesthesia Type: General Level of consciousness: awake and alert Pain management: pain level controlled (initially well controlled) Vital Signs Assessment: post-procedure vital signs reviewed and stable Respiratory status: spontaneous breathing, nonlabored ventilation and respiratory function stable Cardiovascular status: blood pressure returned to baseline and stable Postop Assessment: no signs of nausea or vomiting Anesthetic complications: no Comments: Patient initially doing very well. He was up and walking to the bathroom. He did tolerate and require large amounts of pain medicine (>49m IV dilaudid and over 137moxycodone in addition to exparel injected in his incision). When he had met discharge criteria and his wife came for his ride home he became emotionally upset in regards to his chronic pain and pain med regimen ("ive had pain from head and toe for years and spent 80,000 dollars and all yall give me is an aspirin"). Of note he is being sent home with over twice his normal dose of oral opioid medication in a limited supply to alleviate his acute post surgical pain and he is tolerating oral medication well. His vital signs are completely stable as he is alert and oriented x4. He then demanded to go home and refused to communicate further with us[place]oreaf his concerns and was verbally aggressive towards [place] his current overall social situation. He completely dressed himself and walked from the pacu to his car with no assistance, certainly untypical of someone with great abdominal pain. He [facility] if his pain was not tolerable at home.    Last  Vitals:  Filed Vitals:   03/14/15 1815 03/14/15 1841  BP: 167/108 164/113  Pulse: 98 92  Temp:    Resp: 14 13    Last Pain:  Filed Vitals:   03/14/15 1841  PainSc: 8                  JuZenaida Deed

## 2015-03-14 NOTE — Discharge Instructions (Signed)
[facility] REPAIR: POST OP INSTRUCTIONS ° °1. DIET: Follow a light bland diet the first 24 hours after arrival home, such as soup, liquids, crackers, etc.  Be sure to include lots of fluids daily.  Avoid fast food or heavy meals as your are more likely to get nauseated.  Eat a low fat the next few days after surgery. °2. Take your usually prescribed home medications unless otherwise directed. °3. PAIN CONTROL: °a. Pain is best controlled by a usual combination of three different methods TOGETHER: °i. Ice/Heat °ii. Over the counter pain medication °iii. Prescription pain medication °b. Most patients will experience some swelling and bruising around the hernia(s) such as the bellybutton, groins, or old incisions.  Ice packs or heating pads (30-60 minutes up to 6 times a day) will help. Use ice for the first few days to help decrease swelling and bruising, then switch to heat to help relax tight/sore spots and speed recovery.  Some people prefer to use ice alone, heat alone, alternating between ice & heat.  Experiment to what works for you.  Swelling and bruising can take several weeks to resolve.   °c. It is helpful to take an over-the-counter pain medication regularly for the first few weeks.  Choose one of the following that works best for you: °i. Naproxen (Aleve, etc)  Two 220mg tabs twice a day °ii. Ibuprofen (Advil, etc) Three 200mg tabs four times a day (every meal & bedtime) °iii. Acetaminophen (Tylenol, etc) 325-650mg four times a day (every meal & bedtime) °d. A  prescription for pain medication should be given to you upon discharge.  Take your pain medication as prescribed.  °i. If you are having problems/concerns with the prescription medicine (does not control pain, nausea, vomiting, rash, itching, etc), please call us [phone] to see if we need to switch you to a different pain medicine that will work better for you and/or control your side effect better. °ii. If you need a refill on your pain  medication, please contact your pharmacy.  They will contact our office to request authorization. Prescriptions will not be filled after 5 pm or on week-ends. °4. Avoid getting constipated.  Between the surgery and the pain medications, it is common to experience some constipation.  Increasing fluid intake and taking a fiber supplement (such as Metamucil, Citrucel, FiberCon, MiraLax, etc) 1-2 times a day regularly will usually help prevent this problem from occurring.  A mild laxative (prune juice, Milk of Magnesia, MiraLax, etc) should be taken according to package directions if there are no bowel movements after 48 hours.   °5. Wash / shower every day.  You may shower over the dressings as they are waterproof.   °6. Remove your waterproof bandages 5 days after surgery.  You may leave the incision open to air.  You may replace a dressing/Band-Aid to cover the incision for comfort if you wish.  Continue to shower over incision(s) after the dressing is off. ° ° ° °7. ACTIVITIES as tolerated:   °a. You may resume regular (light) daily activities beginning the next day--such as daily self-care, walking, climbing stairs--gradually increasing activities as tolerated.  If you can walk 30 minutes without difficulty, it is safe to try more intense activity such as jogging, treadmill, bicycling, low-impact aerobics, swimming, etc. °b. Save the most intensive and strenuous activity for last such as sit-ups, heavy lifting, contact sports, etc  Refrain from any heavy lifting or straining until you are off narcotics for pain control.   °  c. DO NOT PUSH THROUGH PAIN.  Let pain be your guide: If it hurts to do something, don't do it.  Pain is your body warning you to avoid that activity for another week until the pain goes down. °d. You may drive when you are no longer taking prescription pain medication, you can comfortably wear a seatbelt, and you can safely maneuver your car and apply brakes. °e. You may have sexual intercourse  when it is comfortable.  °8. FOLLOW UP in our office °a. Please call [facility] at [phone] to set up an appointment to see your surgeon in the office for a follow-up appointment approximately 2-3 weeks after your surgery. °b. Make sure that you call for this appointment the day you arrive home to insure a convenient appointment time. °9.  IF YOU HAVE DISABILITY OR FAMILY LEAVE FORMS, BRING THEM TO THE OFFICE FOR PROCESSING.  DO NOT GIVE THEM TO YOUR DOCTOR. ° °WHEN TO CALL US [phone]: °1. Poor pain control °2. Reactions / problems with new medications (rash/itching, nausea, etc)  °3. Fever over 101.5 F (38.5 C) °4. Inability to urinate °5. Nausea and/or vomiting °6. Worsening swelling or bruising °7. Continued bleeding from incision. °8. Increased pain, redness, or drainage from the incision ° ° The clinic staff is available to answer your questions during regular business hours (8:30am-5pm).  Please don’t hesitate to call and ask to speak to one of our nurses for clinical concerns.  ° If you have a medical emergency, go to the nearest emergency room or call 911. ° A surgeon from [facility] is always on call at the hospitals in [place] ° °[facility] °[address], [address], [place], [place]  [address] ? ° [address] [address], [place], [place]   [address] °MAIN: [phone] ? TOLL FREE: [phone] ? FAX: [phone] °[email or web address] ° °Managing Pain ° °Pain after surgery or related to activity is often due to strain/injury to muscle, tendon, nerves and/or incisions.  This pain is usually short-term and will improve in a few months.  ° °Many people find it helpful to do the following things TOGETHER to help speed the process of healing and to get back to regular activity more quickly: ° °1. Avoid heavy physical activity at first °a. No lifting greater than 20 pounds at first, then increase to lifting as tolerated over the next few weeks °b. Do not “push  through” the pain.  Listen to your body and avoid positions and maneuvers than reproduce the pain.  Wait a few days before trying something more intense °c. Walking is okay as tolerated, but go slowly and stop when getting sore.  If you can walk 30 minutes without stopping or pain, you can try more intense activity (running, jogging, aerobics, cycling, swimming, treadmill, sex, sports, weightlifting, etc ) °d. Remember: If it hurts to do it, then don’t do it! ° °2. Take Anti-inflammatory medication °a. Choose ONE of the following over-the-counter medications: °i.            Acetaminophen 500mg tabs (Tylenol) 1-2 pills with every meal and just before bedtime (avoid if you have liver problems) °ii.            Naproxen 220mg tabs (ex. Aleve) 1-2 pills twice a day (avoid if you have kidney, stomach, IBD, or bleeding problems) °iii. Ibuprofen 200mg tabs (ex. Advil, Motrin) 3-4 pills with every meal and just before bedtime (avoid if you have kidney, stomach, IBD, or bleeding   problems) °b. Take with food/snack around the clock for 1-2 weeks °i. This helps the muscle and nerve tissues become less irritable and calm down faster ° °3. Use a Heating pad or Ice/Cold Pack °a. 4-6 times a day °b. May use warm bath/hottub  or showers ° °4. Try Gentle Massage and/or Stretching  °a. at the area of pain many times a day °b. stop if you feel pain - do not overdo it ° °Try these steps together to help you body heal faster and avoid making things get worse.  Doing just one of these things may not be enough.   ° °If you are not getting better after two weeks or are noticing you are getting worse, contact our office for further advice; we may need to re-evaluate you & see what other things we can do to help. ° °GETTING TO GOOD BOWEL HEALTH. °Irregular bowel habits such as constipation and diarrhea can lead to many problems over time.  Having one soft bowel movement a day is the most important way to prevent further problems.  The  anorectal canal is designed to handle stretching and feces to safely manage our ability to get rid of solid waste (feces, poop, stool) out of our body.  BUT, hard constipated stools can act like ripping concrete bricks and diarrhea can be a burning fire to this very sensitive area of our body, causing inflamed hemorrhoids, anal fissures, increasing risk is perirectal abscesses, abdominal pain/bloating, an making irritable bowel worse.     ° °The goal: ONE SOFT BOWEL MOVEMENT A DAY!  To have soft, regular bowel movements:  °• Drink plenty of fluids, consider 4-6 tall glasses of water a day.   °• Take plenty of fiber.  Fiber is the undigested part of plant food that passes into the colon, acting s “natures broom” to encourage bowel motility and movement.  Fiber can absorb and hold large amounts of water. This results in a larger, bulkier stool, which is soft and easier to pass. Work gradually over several weeks up to 6 servings a day of fiber (25g a day even more if needed) in the form of: °o Vegetables -- Root (potatoes, carrots, turnips), leafy green (lettuce, salad greens, celery, spinach), or cooked high residue (cabbage, broccoli, etc) °o Fruit -- Fresh (unpeeled skin & pulp), Dried (prunes, apricots, cherries, etc ),  or stewed ( applesauce)  °o Whole grain breads, pasta, etc (whole wheat)  °o Bran cereals  °• Bulking Agents -- This type of water-retaining fiber generally is easily obtained each day by one of the following:  °o Psyllium bran -- The psyllium plant is remarkable because its ground seeds can retain so much water. This product is available as Metamucil, Konsyl, Effersyllium, Per Diem Fiber, or the less expensive generic preparation in drug and health food stores. Although labeled a laxative, it really is not a laxative.  °o Methylcellulose -- This is another fiber derived from wood which also retains water. It is available as Citrucel. °o Polyethylene Glycol - and “artificial” fiber commonly called  Miralax or Glycolax.  It is helpful for people with gassy or bloated feelings with regular fiber °o Flax Seed - a less gassy fiber than psyllium °• No reading or other relaxing activity while on the toilet. If bowel movements take longer than 5 minutes, you are too constipated °• AVOID CONSTIPATION.  High fiber and water intake usually takes care of this.  Sometimes a laxative is needed to stimulate more frequent   bowel movements, but   Laxatives are not a good long-term solution as it can wear the colon out.  They can help jump-start bowels if constipated, but should be relied on constantly without discussing with your doctor o Osmotics (Milk of Magnesia, Fleets phosphosoda, Magnesium citrate, MiraLax, GoLytely) are safer than  o Stimulants (Senokot, Castor Oil, Dulcolax, Ex Lax)    o Avoid taking laxatives for more than 7 days in a row.   IF SEVERELY CONSTIPATED, try a Bowel Retraining Program: o Do not use laxatives.  o Eat a diet high in roughage, such as bran cereals and leafy vegetables.  o Drink six (6) ounces of prune or apricot juice each morning.  o Eat two (2) large servings of stewed fruit each day.  o Take one (1) heaping tablespoon of a psyllium-based bulking agent twice a day. Use sugar-free sweetener when possible to avoid excessive calories.  o Eat a normal breakfast.  o Set aside 15 minutes after breakfast to sit on the toilet, but do not strain to have a bowel movement.  o If you do not have a bowel movement by the third day, use an enema and repeat the above steps.   Controlling diarrhea o Switch to liquids and simpler foods for a few days to avoid stressing your intestines further. o Avoid dairy products (especially milk & ice cream) for a short time.  The intestines often can lose the ability to digest lactose when stressed. o Avoid foods that cause gassiness or bloating.  Typical foods include beans and other legumes, cabbage, broccoli, and dairy foods.  Every person has  some sensitivity to other foods, so listen to our body and avoid those foods that trigger problems for you. o Adding fiber (Citrucel, Metamucil, psyllium, Miralax) gradually can help thicken stools by absorbing excess fluid and retrain the intestines to act more normally.  Slowly increase the dose over a few weeks.  Too much fiber too soon can backfire and cause cramping & bloating. o Probiotics (such as active yogurt, Align, etc) may help repopulate the intestines and colon with normal bacteria and calm down a sensitive digestive tract.  Most studies show it to be of mild help, though, and such products can be costly. o Medicines: - Bismuth subsalicylate (ex. Kayopectate, Pepto Bismol) every 30 minutes for up to 6 doses can help control diarrhea.  Avoid if pregnant. - Loperamide (Immodium) can slow down diarrhea.  Start with two tablets (4mg  total) first and then try one tablet every 6 hours.  Avoid if you are having fevers or severe pain.  If you are not better or start feeling worse, stop all medicines and call your doctor for advice o Call your doctor if you are getting worse or not better.  Sometimes further testing (cultures, endoscopy, X-ray studies, bloodwork, etc) may be needed to help diagnose and treat the cause of the diarrhea.  TROUBLESHOOTING IRREGULAR BOWELS 1) Avoid extremes of bowel movements (no bad constipation/diarrhea) 2) Miralax 17gm mixed in 8oz. water or juice-daily. May use BID as needed.  3) Gas-x,Phazyme, etc. as needed for gas & bloating.  4) Soft,bland diet. No spicy,greasy,fried foods.  5) Prilosec over-the-counter as needed  6) May hold gluten/wheat products from diet to see if symptoms improve.  7)  May try probiotics (Align, Activa, etc) to help calm the bowels down 7) If symptoms become worse call back immediately.  Cholecystitis Cholecystitis is inflammation of the gallbladder. It is often called a gallbladder attack. The gallbladder  is a pear-shaped organ that  lies beneath the liver on the right side of the body. The gallbladder stores bile, which is a fluid that helps the body to digest fats. If bile builds up in your gallbladder, your gallbladder becomes inflamed. This condition may occur suddenly (be acute). Repeat episodes of acute cholecystitis or prolonged episodes may lead to a long-term (chronic) condition. Cholecystitis is serious and it requires treatment.  CAUSES The most common cause of this condition is gallstones. Gallstones can block the tube (duct) that carries bile out of your gallbladder. This causes bile to build up. Other causes of this condition include:  Damage to the gallbladder due to a decrease in blood flow.  Infections in the bile ducts.  Scars or kinks in the bile ducts.  Tumors in the liver, pancreas, or gallbladder. RISK FACTORS This condition is more likely to develop in:  People who have sickle cell disease.  People who take birth control pills or use estrogen.  People who have alcoholic liver disease.  People who have liver cirrhosis.  People who have their nutrition delivered through a vein (parenteral nutrition).  People who do not eat or drink (do fasting) for a long period of time.  People who are obese.  People who have rapid weight loss.  People who are pregnant.  People who have increased triglyceride levels.  People who have pancreatitis. SYMPTOMS Symptoms of this condition include:  Abdominal pain, especially in the upper right area of the abdomen.  Abdominal tenderness or bloating.  Nausea.  Vomiting.  Fever.  Chills.  Yellowing of the skin and the whites of the eyes (jaundice). DIAGNOSIS This condition is diagnosed with a medical history and physical exam. You may also have other tests, including:  Imaging tests, such as:  An ultrasound of the gallbladder.  A CT scan of the abdomen.  A gallbladder nuclear scan (HIDA scan). This scan allows your health care provider to  see the bile moving from your liver to your gallbladder and to your small intestine.  MRI.  Blood tests, such as:  A complete blood count, because the white blood cell count may be higher than normal.  Liver function tests, because some levels may be higher than normal with certain types of gallstones. TREATMENT Treatment may include:  Fasting for a certain amount of time.  IV fluids.  Medicine to treat pain or vomiting.  Antibiotic medicine.  Surgery to remove your gallbladder (cholecystectomy). This may happen immediately or at a later time. [place] care will depend on your treatment. In general:  Take over-the-counter and prescription medicines only as told by your health care provider.  If you were prescribed an antibiotic medicine, take it as told by your health care provider. Do not stop taking the antibiotic even if you start to feel better.  Follow instructions from your health care provider about what to eat or drink. When you are allowed to eat, avoid eating or drinking anything that triggers your symptoms.  Keep all follow-up visits as told by your health care provider. This is important. SEEK MEDICAL CARE IF:  Your pain is not controlled with medicine.  You have a fever. SEEK IMMEDIATE MEDICAL CARE IF:  Your pain moves to another part of your abdomen or to your back.  You continue to have symptoms or you develop new symptoms even with treatment.   This information is not intended to replace advice given to you by your health care provider. Make  sure you discuss any questions you have with your health care provider.   Document Released: 03/09/2005 Document Revised: 11/28/2014 Document Reviewed: 06/20/2014 Elsevier Interactive Patient Education 2016 [facility], Adult, Care After Refer to this sheet in the next few weeks. These instructions provide you with information on caring for yourself after your  procedure. Your health care provider may also give you more specific instructions. Your treatment has been planned according to current medical practices, but problems sometimes occur. Call your health care provider if you have any problems or questions after your procedure. WHAT TO EXPECT AFTER THE PROCEDURE After the procedure, it is typical to experience:  Sleepiness.  Nausea and vomiting. HOME CARE INSTRUCTIONS  For the first 24 hours after general anesthesia:  Have a responsible person with you.  Do not drive a car. If you are alone, do not take public transportation.  Do not drink alcohol.  Do not take medicine that has not been prescribed by your health care provider.  Do not sign important papers or make important decisions.  You may resume a normal diet and activities as directed by your health care provider.  Change bandages (dressings) as directed.  If you have questions or problems that seem related to general anesthesia, call the hospital and ask for the anesthetist or anesthesiologist on call. SEEK MEDICAL CARE IF:  You have nausea and vomiting that continue the day after anesthesia.  You develop a rash. SEEK IMMEDIATE MEDICAL CARE IF:   You have difficulty breathing.  You have chest pain.  You have any allergic problems.   This information is not intended to replace advice given to you by your health care provider. Make sure you discuss any questions you have with your health care provider.   Document Released: 06/15/2000 Document Revised: 03/30/2014 Document Reviewed: 07/08/2011 Elsevier Interactive Patient Education [employer].

## 2015-03-14 NOTE — Op Note (Signed)
03/14/2015  5:37 PM  PATIENT:  Oscar Hall  54 y.o. male  Patient Care Team: Delanna Notice, MD as PCP - General (Internal Medicine) Danie Binder, MD as Consulting Physician (Gastroenterology) Michael Boston, MD as Consulting Physician (General Surgery) Merian Capron, MD as Consulting Physician (Psychiatry)  PRE-OPERATIVE DIAGNOSIS:  Symptomatic biliary colic, probable chronic cholecystitis incarcerated periumbilical ventral wall hernia  POST-OPERATIVE DIAGNOSIS:    Symptomatic biliary colic Chronic cholecystitis Incarcerated periumbilical ventral wall hernia Steatohepatitis, possible early cirrhosis  PROCEDURE:  : LAPAROSCOPIC SINGLE SITE CHOLECYSTECTOMY WITH INTRAOPERATIVE CHOLANGIOGRAM PRIMARY REPAIR OF INCARCERATED VENTRAL HERNIA LAPAROSCOPIC CORE LIVER BIOPSY x 3  SURGEON:  Surgeon(s): Michael Boston, MD  ASSISTANT: RN   ANESTHESIA:   local and general  EBL:  Total I/O In: 1000 [I.V.:1000] Out: 67 [Blood:50]  Delay start of Pharmacological VTE agent (>24hrs) due to surgical blood loss or risk of bleeding:  no  DRAINS: none   SPECIMEN:  Source of Specimen:  1.  Gallbladder   2.  Core liver biopsies x 3.  3. Hernia sac  DISPOSITION OF SPECIMEN:  PATHOLOGY  COUNTS:  YES  PLAN OF CARE: Discharge to home after PACU  PATIENT DISPOSITION:  PACU - hemodynamically stable.  INDICATION:  Active male  With intermittent episodes of biliary colic. Recent ultrasound showing sludge.  Probable dyskinesia.  I offered cholecystectomy.  Patient also with chronically incarcerated periumbilical ventral hernia.  I offered reduction and primary repair.  Patient did not wish to have mesh at this time anyway.  The anatomy & physiology of hepatobiliary & pancreatic function was discussed.  The pathophysiology of gallbladder dysfunction was discussed.  Natural history risks without surgery was discussed.   I feel the risks of no intervention will lead to serious problems that outweigh the  operative risks; therefore, I recommended cholecystectomy to remove the pathology.  I explained laparoscopic techniques with possible need for an open approach.  Probable cholangiogram to evaluate the bilary tract was explained as well.    Risks such as bleeding, infection, abscess, leak, injury to other organs, need for further treatment, heart attack, death, and other risks were discussed.  I noted a good likelihood this will help address the problem.  Possibility that this will not correct all abdominal symptoms was explained.  Goals of post-operative recovery were discussed as well.  We will work to minimize complications.  An educational handout further explaining the pathology and treatment options was given as well.  Questions were answered.  The patient expresses understanding & wishes to proceed with surgery.  The anatomy & physiology of the abdominal wall was discussed.  The pathophysiology of hernias was discussed.  Natural history risks without surgery including progeressive enlargement, pain, incarceration, & strangulation was discussed.   Contributors to complications such as smoking, obesity, diabetes, prior surgery, etc were discussed.   I feel the risks of no intervention will lead to serious problems that outweigh the operative risks; therefore, I recommended surgery to reduce and repair the hernia.  I explained laparoscopic techniques with possible need for an open approach.  I noted the probable use of mesh to patch and/or buttress the hernia repair  Risks such as bleeding, infection, abscess, need for further treatment, injury to other organs, need for repair of tissues / organs, stroke, heart attack, death, and other risks were discussed.  I noted a good likelihood this will help address the problem.   Goals of post-operative recovery were discussed as well.  Possibility that this will not correct  all symptoms was explained.  I stressed the importance of low-impact activity, aggressive  pain control, avoiding constipation, & not pushing through pain to minimize risk of post-operative chronic pain or injury. Possibility of reherniation especially with smoking, obesity, diabetes, immunosuppression, and other health conditions was discussed.  We will work to minimize complications.     An educational handout further explaining the pathology & treatment options was given as well.  Questions were answered.  The patient expresses understanding & wishes to proceed with surgery.     OR FINDINGS:  3 x 2 cm periumbilical entral hernia incarcerated with 9x9x9cm moderate volume of omentum. Omental  adhesions to gallbladder wall thickening consistent with chronic cholecystitis.  Scalloping & color changes on liver suspicious for steatohepatitis with possible early cirrhosis.   Intraoperative cholangiogram with classic biliary anatomy.  No leak/stricture/obstruction  DESCRIPTION:   The patient was identified & brought in the operating room. The patient was positioned supine with arms tucked. SCDs were active during the entire case. The patient underwent general anesthesia without any difficulty.  The abdomen was prepped and draped in a sterile fashion. A Surgical Timeout confirmed our plan.   I was able to gradually reduce most of the incarcerated hernia.  I made a transverse curvilinear incision through the superior umbilical fold.  I placed a 55mm long port through the  Superior edge of the ventral hernia through the hernia sac using a modified Hassan cutdown technique. I began carbon dioxide insufflation. Camera inspection revealed no injury. There were no adhesions to the anterior abdominal wall supraumbilically.  I proceeded to continue with single site technique. I placed a #5 port in left upper aspect of the wound. I placed a 5 mm atraumatic grasper in the right inferior aspect of the wound.  I turned attention to the right upper quadrant.   I freed some greater omental adhesions off the  liver edge in the dilated gallbladder with thickening.  The gallbladder fundus was elevated cephalad. I freed the peritoneal coverings between the gallbladder and the liver on the posteriolateral and anteriomedial walls. I alternated between Harmonic & blunt [place] dissection to help get a good critical view of the cystic artery and cystic duct. I did further dissection to free a few centimeters of the  gallbladder off the liver bed to get a good critical view of the infundibulum and cystic duct. I mobilized the cystic artery; and, after getting a good 360 view, ligated the cystic artery using the Harmonic ultrasonic dissection.  I skeletonized the cystic duct  I confirmed a good 360 critical view.  I placed a clip on the infundibulum. I did a partial cystic duct-otomy and ensured patency. I placed a 5 [place] cholangiocatheter through a puncture site at the right subcostal ridge of the abdominal wall and directed it into the cystic duct.  We ran a cholangiogram with dilute radio-opaque contrast and continuous fluoroscopy.  Contrast flowed from a side branch consistent with cystic duct cannulization. Contrast flowed up the common hepatic duct into the right and left intrahepatic chains out to secondary radicals. Contrast flowed down the common bile duct easily across the normal ampulla into the duodenum.  This was consistent with a normal cholangiogram.  I removed the cholangiocatheter. I placed clips on the cystic duct x4.  I completed cystic duct transection. I freed the gallbladder from its remaining attachments to the liver. I ensured hemostasis on the gallbladder fossa of the liver and elsewhere. I inspected the rest of the abdomen &  detected no injury nor bleeding elsewhere.   because of the abnormal liver with chronic right upper quadrant issues, I decided to proceed with liver biopsy.  I used a 14-gauge true cut needle core biopsy.  Puncture long right subcostal ridge.  Got 3 excellent cores of the  segment 5 of the right hepatic lobe.  I ensured hemostasis.  I  Placed the gallbladder in size Endo Catch bag. I removed the gallbladder out the supraumbilical fascia.  Evacuated carbon dioxide. I excised the hernia sac and some incarcerated greater omentum off the subcutaneous taste tissues and anterior abdominal wall. I freed the umbilical stalk off of the hernia as well. I primarily repaired the paraumbilical ventral hernia using #1 PDS interrupted sutures to good result. I excised  At some stretched out and thinned out skin primarily in the left infraumbilical region.  I tacked the umbilical stalk back down to the fascia using 0 Vicryl interrupted suture.  I closed the skin using 4-0 monocryl stitch.   Redundant skin had been removed and cosmetically looked much more normal. Sterile dressing was applied. The patient was extubated & arrived in the PACU in stable condition..  I had discussed postoperative care with the patient in the holding area. I am about to locate the patient's family and discuss operative findings and postoperative goals / instructions.  Instructions are written in the chart as well.   Hopefully should be able to go home.  He wished to go home as well.  [name], M.D., F.A.C.S. Gastrointestinal and Minimally Invasive Surgery [facility] [address] [place],  [address] [phone] Main / Paging

## 2015-03-15 ENCOUNTER — Encounter (HOSPITAL_COMMUNITY): Payer: Self-pay | Admitting: Surgery

## 2015-03-15 ENCOUNTER — Emergency Department (HOSPITAL_COMMUNITY)
Admission: EM | Admit: 2015-03-15 | Discharge: 2015-03-15 | Discharge status: Against Medical Advice | Payer: Medicaid Other | Attending: Emergency Medicine | Admitting: Emergency Medicine

## 2015-03-15 DIAGNOSIS — G8918 Other acute postprocedural pain: Secondary | ICD-10-CM | POA: Insufficient documentation

## 2015-03-15 DIAGNOSIS — G8929 Other chronic pain: Secondary | ICD-10-CM | POA: Diagnosis not present

## 2015-03-15 DIAGNOSIS — I2510 Atherosclerotic heart disease of native coronary artery without angina pectoris: Secondary | ICD-10-CM | POA: Diagnosis not present

## 2015-03-15 DIAGNOSIS — J449 Chronic obstructive pulmonary disease, unspecified: Secondary | ICD-10-CM | POA: Insufficient documentation

## 2015-03-15 DIAGNOSIS — F17210 Nicotine dependence, cigarettes, uncomplicated: Secondary | ICD-10-CM | POA: Insufficient documentation

## 2015-03-15 DIAGNOSIS — I10 Essential (primary) hypertension: Secondary | ICD-10-CM | POA: Insufficient documentation

## 2015-03-15 HISTORY — DX: Chronic pain syndrome: G89.4

## 2015-03-15 HISTORY — DX: Repeated falls: R29.6

## 2015-03-15 HISTORY — DX: Dorsalgia, unspecified: M54.9

## 2015-03-15 HISTORY — DX: Cervicalgia: M54.2

## 2015-03-15 HISTORY — DX: Pain in unspecified shoulder: M25.519

## 2015-03-15 HISTORY — DX: Other chronic pain: G89.29

## 2015-03-15 NOTE — ED Notes (Signed)
Pt had gall bladder removal and hernia surgery yesterday, sent home with oxycodone 5-325.  Pt called office this morning and doctor is out of office today. Pt fell last night.   No other complaints.

## 2015-03-15 NOTE — ED Notes (Signed)
Called pt 3 times with no response.

## 2015-03-28 MED FILL — HYDROmorphone HCL 2 MG TABS: 2 | 8 days supply | Qty: 30 | Fill #0

## 2015-04-02 ENCOUNTER — Encounter: Payer: Self-pay | Admitting: Surgery

## 2015-04-02 ENCOUNTER — Other Ambulatory Visit: Payer: Self-pay | Admitting: Surgery

## 2015-04-04 MED FILL — LISINOPRIL-HCTZ 20-25 MG TA: 20-25 | 30 days supply | Qty: 30 | Fill #6

## 2015-04-08 ENCOUNTER — Ambulatory Visit (HOSPITAL_COMMUNITY): Payer: Medicaid Other | Admitting: Psychiatry

## 2015-04-10 MED FILL — HYDROmorphone HCL 4 MG TABS: 4 | 5 days supply | Qty: 40 | Fill #0

## 2015-04-15 ENCOUNTER — Ambulatory Visit: Payer: Medicaid Other | Admitting: Nurse Practitioner

## 2015-04-18 ENCOUNTER — Encounter (HOSPITAL_COMMUNITY): Payer: Self-pay | Admitting: Psychiatry

## 2015-04-18 ENCOUNTER — Ambulatory Visit (INDEPENDENT_AMBULATORY_CARE_PROVIDER_SITE_OTHER): Payer: Medicaid Other | Admitting: Psychiatry

## 2015-04-18 DIAGNOSIS — F3113 Bipolar disorder, current episode manic without psychotic features, severe: Secondary | ICD-10-CM

## 2015-04-18 DIAGNOSIS — F4310 Post-traumatic stress disorder, unspecified: Secondary | ICD-10-CM | POA: Diagnosis not present

## 2015-04-18 DIAGNOSIS — G894 Chronic pain syndrome: Secondary | ICD-10-CM

## 2015-04-18 DIAGNOSIS — F17200 Nicotine dependence, unspecified, uncomplicated: Secondary | ICD-10-CM | POA: Diagnosis not present

## 2015-04-18 MED ORDER — LAMOTRIGINE 25 MG PO TABS: 50.0000 mg | ORAL_TABLET | Freq: Every day | ORAL | Status: DC

## 2015-04-18 MED ORDER — AMITRIPTYLINE HCL 50 MG PO TABS: 50.0000 mg | ORAL_TABLET | Freq: Every day | ORAL | Status: DC

## 2015-04-18 MED ORDER — BUSPIRONE HCL 10 MG PO TABS: 10.0000 mg | ORAL_TABLET | Freq: Two times a day (BID) | ORAL | Status: DC
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
## 2015-04-22 ENCOUNTER — Encounter: Payer: Self-pay | Admitting: Family Medicine

## 2015-04-22 ENCOUNTER — Ambulatory Visit (INDEPENDENT_AMBULATORY_CARE_PROVIDER_SITE_OTHER): Payer: Medicaid Other | Admitting: Family Medicine

## 2015-04-22 VITALS — BP 131/88 | HR 97 | Temp 97.3°F | Ht 71.0 in | Wt 252.2 lb

## 2015-04-22 DIAGNOSIS — R1013 Epigastric pain: Secondary | ICD-10-CM

## 2015-04-22 DIAGNOSIS — IMO0001 Reserved for inherently not codable concepts without codable children: Secondary | ICD-10-CM

## 2015-04-22 DIAGNOSIS — J449 Chronic obstructive pulmonary disease, unspecified: Secondary | ICD-10-CM | POA: Diagnosis not present

## 2015-04-22 DIAGNOSIS — I10 Essential (primary) hypertension: Secondary | ICD-10-CM

## 2015-04-22 DIAGNOSIS — Z1159 Encounter for screening for other viral diseases: Secondary | ICD-10-CM

## 2015-04-22 MED ORDER — ONDANSETRON 4 MG PO TBDP: 4.0000 mg | ORAL_TABLET | Freq: Three times a day (TID) | ORAL | Status: DC | PRN

## 2015-04-22 MED ORDER — LISINOPRIL-HYDROCHLOROTHIAZIDE 20-25 MG PO TABS: 1.0000 | ORAL_TABLET | Freq: Every day | ORAL | Status: DC

## 2015-04-22 MED ORDER — TIOTROPIUM BROMIDE MONOHYDRATE 18 MCG IN CAPS: 18.0000 ug | ORAL_CAPSULE | Freq: Every day | RESPIRATORY_TRACT | Status: DC

## 2015-04-22 MED ORDER — ALBUTEROL SULFATE HFA 108 (90 BASE) MCG/ACT IN AERS: 2.0000 | INHALATION_SPRAY | Freq: Four times a day (QID) | RESPIRATORY_TRACT | Status: DC | PRN

## 2015-04-22 MED ORDER — OXYCODONE HCL 5 MG PO TABS: 5.0000 mg | ORAL_TABLET | ORAL | Status: DC | PRN

## 2015-04-22 MED ORDER — OMEPRAZOLE 40 MG PO CPDR: 40.0000 mg | DELAYED_RELEASE_CAPSULE | Freq: Every day | ORAL | Status: DC

## 2015-04-22 MED ORDER — AMITRIPTYLINE HCL 50 MG PO TABS: 50.0000 mg | ORAL_TABLET | Freq: Every day | ORAL | Status: DC

## 2015-04-22 NOTE — Progress Notes (Signed)
BP 131/88 mmHg  Pulse 97  Temp(Src) 97.3 F (36.3 C) (Oral)  Ht '5\' 11"'  (1.803 m)  Wt 252 lb 3.2 oz (114.397 kg)  BMI 35.19 kg/m2   Subjective:    Patient ID: Oscar Hall, male    DOB: 04/01/1960, 55 y.o.   MRN: [id number]  HPI: Oscar Hall is a 55 y.o. male presenting on 04/22/2015 for Labwork and Medication Refill   HPI Hypertension recheck Patient is coming in today for hypertension recheck. He is currently on lisinopril-hydrochlorothiazide and his blood pressure today is 131/88 and is controlled. Patient denies headaches, blurred vision, chest pains, shortness of breath, or weakness. Denies any side effects from medication and is content with current medication.   COPD Patient is coming in for recheck of COPD. He is currently on Spiriva with albuterol rescue inhaler. Since he has been started on the Spiriva he feels like his breathing has been doing a lot better. He is still using the rescue inhaler 6-7 times a week. He denies any nighttime symptoms that awaken him up. He denies any fevers or chills or shortness of breath or wheezing today.  Abdominal pain Patient had been having epigastric abdominal pain and was found to have cholecystitis and had his gallbladder removed less than a month ago. He says he is still having some pain from that but is coming down and he was wondering if he can get one more refill of his pain prescription just to get him through this last little. He denies any nausea or vomiting or diarrhea or constipation.  Relevant past medical, surgical, family and social history reviewed and updated as indicated. Interim medical history since our last visit reviewed. Allergies and medications reviewed and updated.  Review of Systems  Constitutional: Negative for fever and chills.  HENT: Negative for congestion, ear discharge and ear pain.   Eyes: Negative for discharge and visual disturbance.  Respiratory: Positive for cough and wheezing. Negative for chest  tightness and shortness of breath.   Cardiovascular: Negative for chest pain and leg swelling.  Gastrointestinal: Positive for abdominal pain. Negative for nausea, vomiting, diarrhea and constipation.  Genitourinary: Negative for difficulty urinating.  Musculoskeletal: Negative for back pain and gait problem.  Skin: Negative for rash.  Neurological: Negative for dizziness, syncope, light-headedness and headaches.  All other systems reviewed and are negative.   Per HPI unless specifically indicated above     Medication List       This list is accurate as of: 04/22/15 10:49 AM.  Always use your most recent med list.               albuterol 108 (90 Base) MCG/ACT inhaler  Commonly known as:  PROVENTIL HFA;VENTOLIN HFA  Inhale 2 puffs into the lungs every 6 (six) hours as needed for wheezing or shortness of breath.     amitriptyline 50 MG tablet  Commonly known as:  ELAVIL  Take 1 tablet (50 mg total) by mouth at bedtime.     busPIRone 10 MG tablet  Commonly known as:  BUSPAR  Take 1 tablet (10 mg total) by mouth 2 (two) times daily.     ezetimibe-simvastatin 10-10 MG tablet  Commonly known as:  VYTORIN  Take 1 tablet by mouth at bedtime.     lamoTRIgine 25 MG tablet  Commonly known as:  LAMICTAL  Take 2 tablets (50 mg total) by mouth daily.     lisinopril-hydrochlorothiazide 20-25 MG tablet  Commonly known as:  PRINZIDE,ZESTORETIC  Take  1 tablet by mouth daily.     omeprazole 40 MG capsule  Commonly known as:  PRILOSEC  Take 1 capsule (40 mg total) by mouth daily.     ondansetron 4 MG disintegrating tablet  Commonly known as:  ZOFRAN-ODT  Take 1 tablet (4 mg total) by mouth every 8 (eight) hours as needed for nausea or vomiting.     oxyCODONE 5 MG immediate release tablet  Commonly known as:  ROXICODONE  Take 1-2 tablets (5-10 mg total) by mouth every 4 (four) hours as needed for moderate pain or severe pain. Pain     tiotropium 18 MCG inhalation capsule    Commonly known as:  SPIRIVA HANDIHALER  Place 1 capsule (18 mcg total) into inhaler and inhale daily.           Objective:    BP 131/88 mmHg  Pulse 97  Temp(Src) 97.3 F (36.3 C) (Oral)  Ht '5\' 11"'  (1.803 m)  Wt 252 lb 3.2 oz (114.397 kg)  BMI 35.19 kg/m2  Wt Readings from Last 3 Encounters:  04/22/15 252 lb 3.2 oz (114.397 kg)  03/15/15 249 lb 9.6 oz (113.218 kg)  03/14/15 247 lb (112.038 kg)    Physical Exam  Constitutional: He is oriented to person, place, and time. He appears well-developed and well-nourished. No distress.  Eyes: Conjunctivae and EOM are normal. Pupils are equal, round, and reactive to light. Right eye exhibits no discharge. No scleral icterus.  Neck: Neck supple. No thyromegaly present.  Cardiovascular: Normal rate, regular rhythm, normal heart sounds and intact distal pulses.   No murmur heard. Pulmonary/Chest: Effort normal and breath sounds normal. No respiratory distress. He has no wheezes.  Abdominal: He exhibits no distension. There is tenderness. There is no rebound and no guarding.  Musculoskeletal: Normal range of motion. He exhibits no edema.  Lymphadenopathy:    He has no cervical adenopathy.  Neurological: He is alert and oriented to person, place, and time. Coordination normal.  Skin: Skin is warm and dry. No rash noted. He is not diaphoretic.  Psychiatric: He has a normal mood and affect. His behavior is normal.  Nursing note and vitals reviewed.   Results for orders placed or performed during the hospital encounter of 00/92/33  Basic metabolic panel  Result Value Ref Range   Sodium 139 135 - 145 mmol/L   Potassium 4.7 3.5 - 5.1 mmol/L   Chloride 104 101 - 111 mmol/L   CO2 28 22 - 32 mmol/L   Glucose, Bld 91 65 - 99 mg/dL   BUN 8 6 - 20 mg/dL   Creatinine, Ser 0.93 0.61 - 1.24 mg/dL   Calcium 9.8 8.9 - 10.3 mg/dL   GFR calc non Af Amer >60 >60 mL/min   GFR calc Af Amer >60 >60 mL/min   Anion gap 7 5 - 15  CBC  Result Value Ref  Range   WBC 9.6 4.0 - 10.5 K/uL   RBC 4.95 4.22 - 5.81 MIL/uL   Hemoglobin 16.4 13.0 - 17.0 g/dL   HCT 46.8 39.0 - 52.0 %   MCV 94.5 78.0 - 100.0 fL   MCH 33.1 26.0 - 34.0 pg   MCHC 35.0 30.0 - 36.0 g/dL   RDW 12.9 11.5 - 15.5 %   Platelets 196 150 - 400 K/uL      Assessment & Plan:   Problem List Items Addressed This Visit      Cardiovascular and Mediastinum   Essential hypertension - Primary  Relevant Medications   lisinopril-hydrochlorothiazide (PRINZIDE,ZESTORETIC) 20-25 MG tablet   Other Relevant Orders   CMP14+EGFR (Completed)   Lipid panel (Completed)     Respiratory   COPD bronchitis   Relevant Medications   tiotropium (SPIRIVA HANDIHALER) 18 MCG inhalation capsule   albuterol (PROVENTIL HFA;VENTOLIN HFA) 108 (90 Base) MCG/ACT inhaler     Other   Abdominal pain    Patient has some residual pain after his surgery and will give 1 more refill of her pain meds      Relevant Medications   omeprazole (PRILOSEC) 40 MG capsule   ondansetron (ZOFRAN-ODT) 4 MG disintegrating tablet   oxyCODONE (ROXICODONE) 5 MG immediate release tablet    Other Visit Diagnoses    Need for hepatitis C screening test        Relevant Orders    Hepatitis C antibody       Follow up plan: Return in about 3 months (around 07/21/2015), or if symptoms worsen or fail to improve, for Recheck COPD and blood pressure.  Counseling provided for all of the vaccine components Orders Placed This Encounter  Procedures  . CMP14+EGFR  . Lipid panel    [name], MD [facility] 04/22/2015, 10:49 AM

## 2015-04-23 LAB — CMP14+EGFR
A/G RATIO: 1.3 (ref 1.1–2.5)
ALK PHOS: 91 IU/L (ref 39–117)
ALT: 57 IU/L — AB (ref 0–44)
AST: 36 IU/L (ref 0–40)
Albumin: 4.3 g/dL (ref 3.5–5.5)
BILIRUBIN TOTAL: 0.4 mg/dL (ref 0.0–1.2)
BUN/Creatinine Ratio: 12 (ref 9–20)
BUN: 11 mg/dL (ref 6–24)
CHLORIDE: 97 mmol/L (ref 96–106)
CO2: 24 mmol/L (ref 18–29)
Calcium: 9.3 mg/dL (ref 8.7–10.2)
Creatinine, Ser: 0.9 mg/dL (ref 0.76–1.27)
GFR calc non Af Amer: 97 mL/min/{1.73_m2} (ref >59)
GFR, EST AFRICAN AMERICAN: 112 mL/min/{1.73_m2} (ref >59)
GLUCOSE: 129 mg/dL — AB (ref 65–99)
Globulin, Total: 3.4 g/dL (ref 1.5–4.5)
POTASSIUM: 4 mmol/L (ref 3.5–5.2)
Sodium: 135 mmol/L (ref 134–144)
Total Protein: 7.7 g/dL (ref 6.0–8.5)

## 2015-04-23 LAB — LIPID PANEL
CHOLESTEROL TOTAL: 196 mg/dL (ref 100–199)
Chol/HDL Ratio: 5.6 ratio units — ABNORMAL HIGH (ref 0.0–5.0)
HDL: 35 mg/dL — AB (ref >39)
LDL Calculated: 124 mg/dL — ABNORMAL HIGH (ref 0–99)
TRIGLYCERIDES: 187 mg/dL — AB (ref 0–149)
VLDL Cholesterol Cal: 37 mg/dL (ref 5–40)

## 2015-04-23 LAB — HEPATITIS C ANTIBODY

## 2015-04-24 NOTE — Assessment & Plan Note (Signed)
Patient has some residual pain after his surgery and will give 1 more refill of her pain meds

## 2015-04-26 ENCOUNTER — Telehealth: Payer: Self-pay | Admitting: Family Medicine

## 2015-04-26 ENCOUNTER — Other Ambulatory Visit (INDEPENDENT_AMBULATORY_CARE_PROVIDER_SITE_OTHER): Payer: Medicaid Other

## 2015-04-26 DIAGNOSIS — R7301 Impaired fasting glucose: Secondary | ICD-10-CM

## 2015-04-26 DIAGNOSIS — R768 Other specified abnormal immunological findings in serum: Secondary | ICD-10-CM

## 2015-04-26 LAB — POCT GLYCOSYLATED HEMOGLOBIN (HGB A1C): Hemoglobin A1C: 5.1

## 2015-04-26 NOTE — Telephone Encounter (Signed)
Patient aware of results and will return for future lab work.

## 2015-04-26 NOTE — Telephone Encounter (Signed)
Patient had hepatitis C antibody positive and needs to come back in and have a follow-up test to confirm whether or not it is active or if he just had it in the past.

## 2015-04-27 LAB — HCV RNA QUANT
HCV LOG10: 6.877 {Log_IU}/mL
HEPATITIS C QUANTITATION: 7540000 [IU]/mL

## 2015-04-29 ENCOUNTER — Telehealth: Payer: Self-pay | Admitting: Family Medicine

## 2015-04-29 DIAGNOSIS — B1920 Unspecified viral hepatitis C without hepatic coma: Secondary | ICD-10-CM

## 2015-04-29 NOTE — Telephone Encounter (Signed)
Patient aware of results and referral placed for infectious disease.

## 2015-05-06 ENCOUNTER — Telehealth: Payer: Self-pay | Admitting: Family Medicine

## 2015-05-06 NOTE — Telephone Encounter (Signed)
Call given to referrals

## 2015-05-08 ENCOUNTER — Ambulatory Visit: Payer: Self-pay | Admitting: Nurse Practitioner

## 2015-06-02 IMAGING — CT CT CERVICAL SPINE W/O CM
3 of 4 series · 13 of 33 positions shown, 16 images · non-contrast
Comparison: MRI head 08/01/2014

CLINICAL DATA: Chronic neck pain.  Abnormal MRI C4 vertebra.

EXAM:
CT CERVICAL SPINE WITHOUT CONTRAST
TECHNIQUE: Multidetector CT imaging of the cervical spine was performed without
intravenous contrast. Multiplanar CT image reconstructions were also
generated.

[Series 3: c_spine 2.0 i30s 3 · axial · 0.34mm/px · z∈[-283,-171]mm · 5 of 86 slices shown, 7 images]
[im 15/86  soft-tissue]
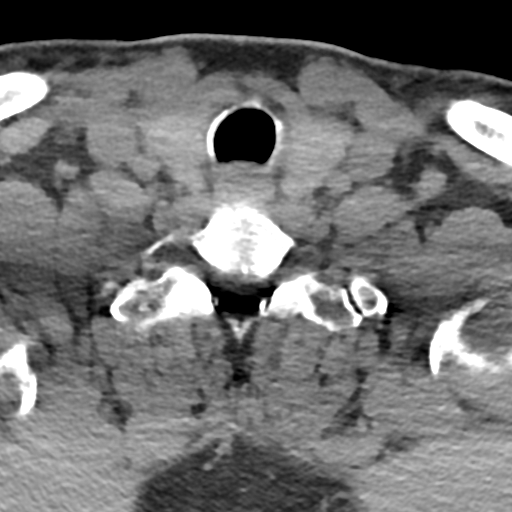
[im 15/86  bone]
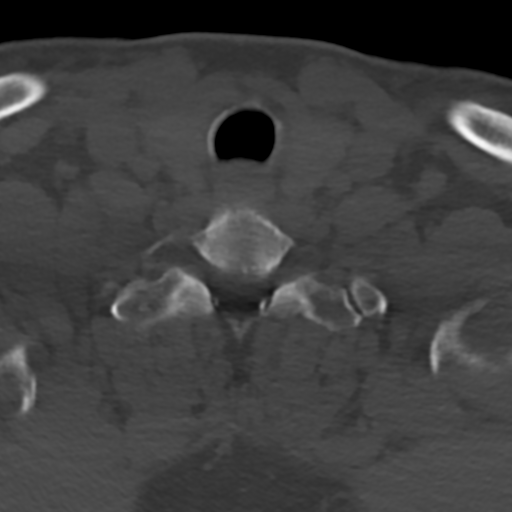
[im 29/86  bone]
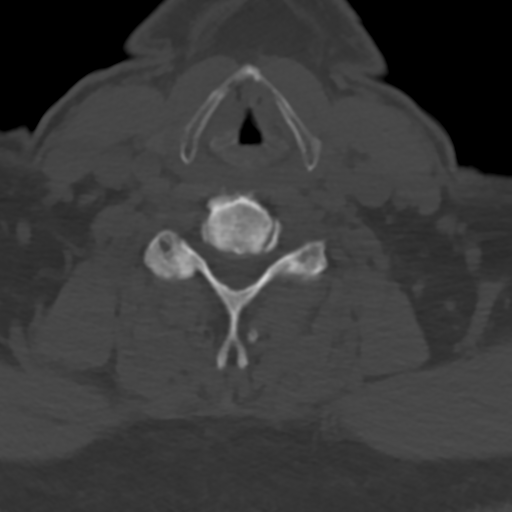
[im 43/86  bone]
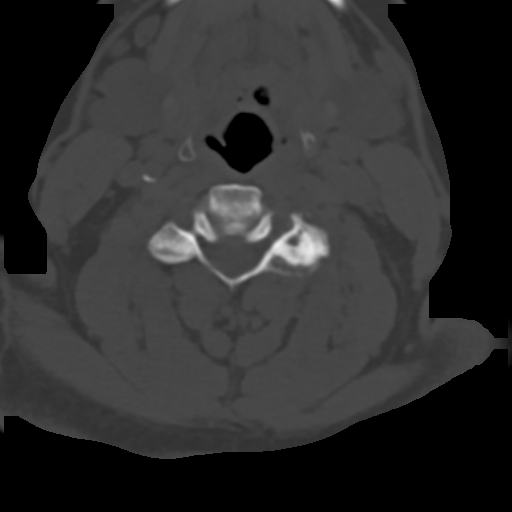
[im 57/86  bone]
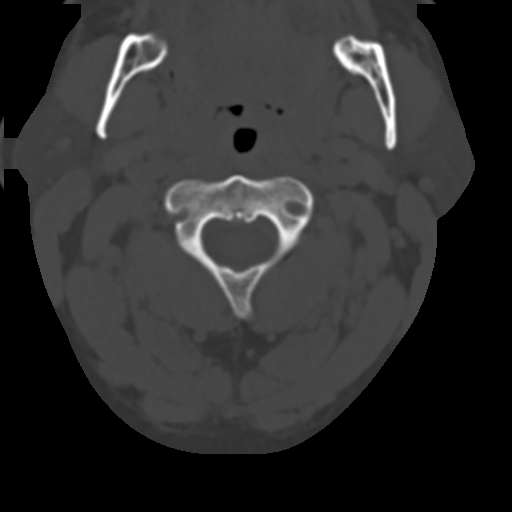
[im 71/86  soft-tissue]
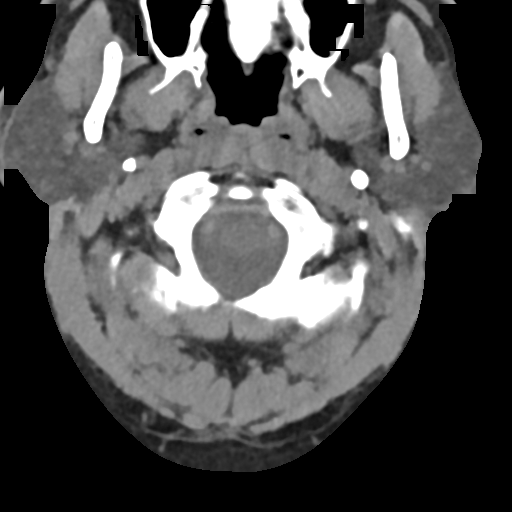
[im 71/86  bone]
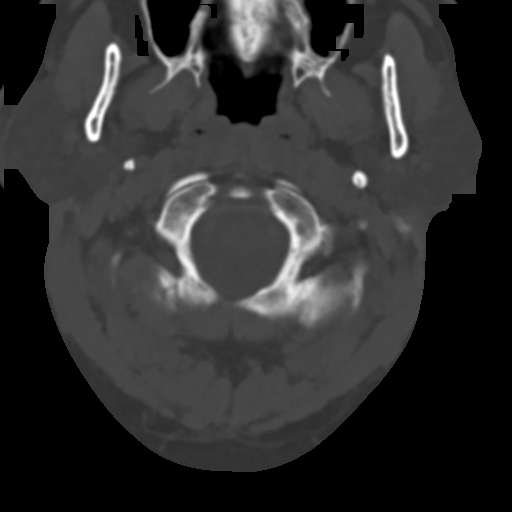

[Series 4: coronal bone · coronal · 0.27mm/px · 3 of 61 slices shown]
[im 13/61  bone]
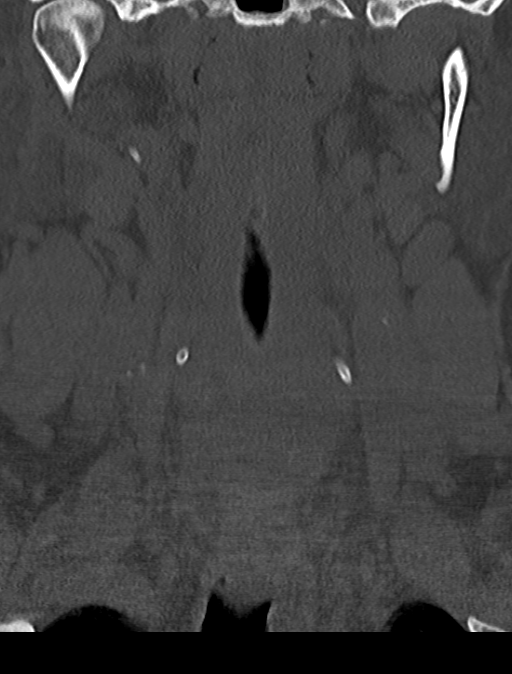
[im 25/61  bone]
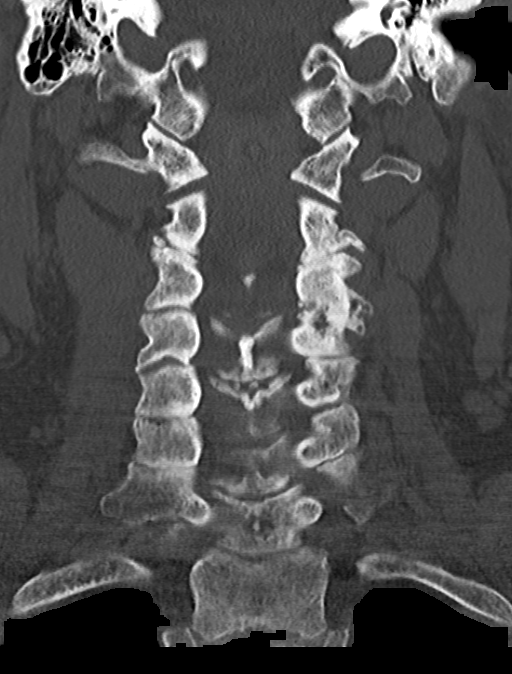
[im 37/61  bone]
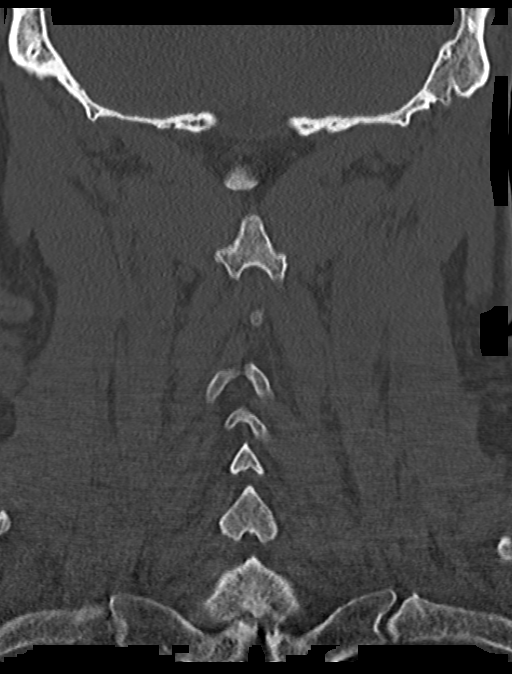

[Series 5: sagittal bone · sagittal · 0.27mm/px · 5 of 63 slices shown, 6 images]
[im 21/63  bone]
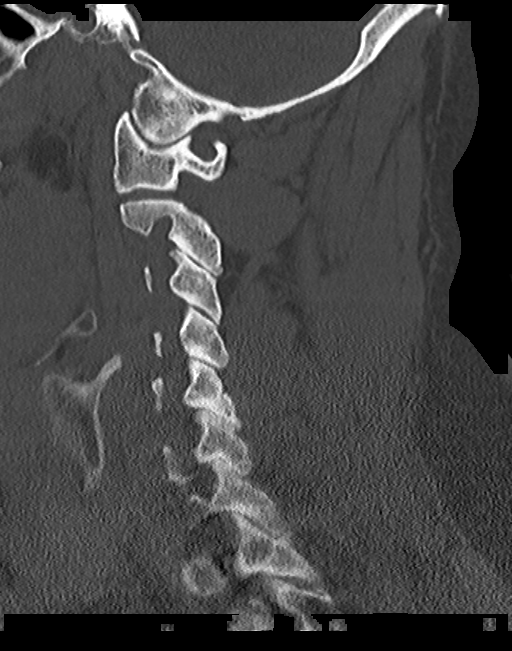
[im 26/63  bone]
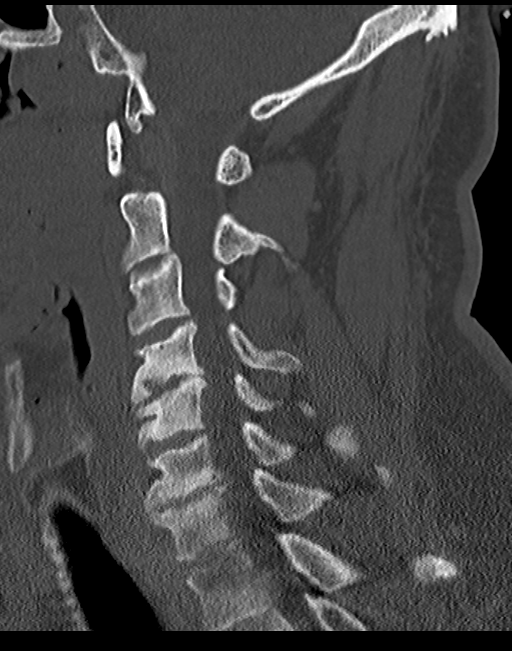
[im 32/63  soft-tissue]
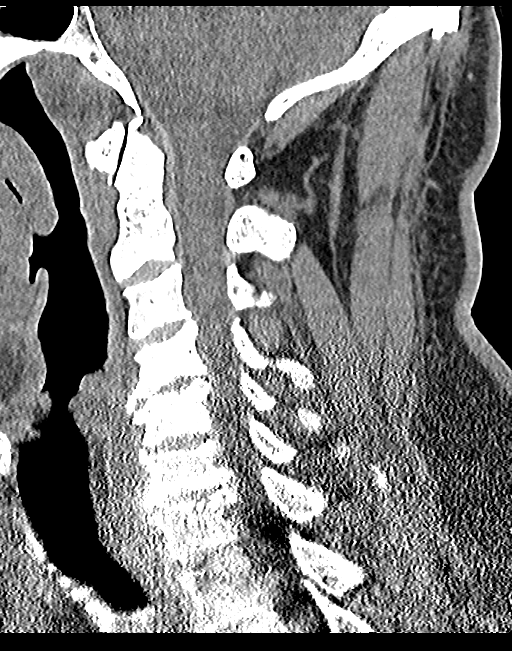
[im 32/63  bone]
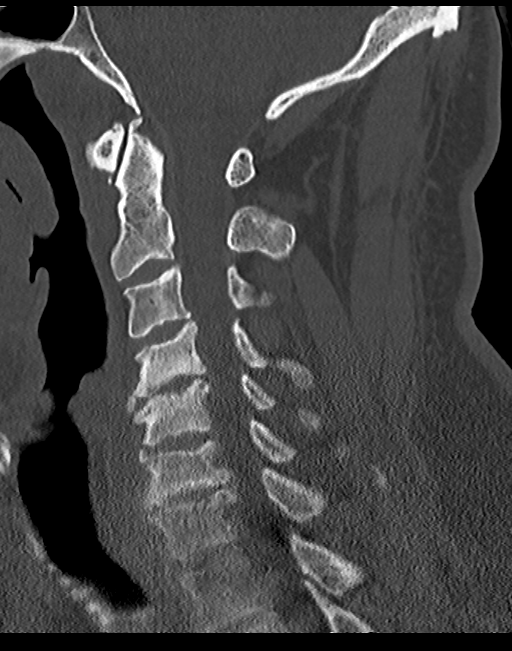
[im 37/63  bone]
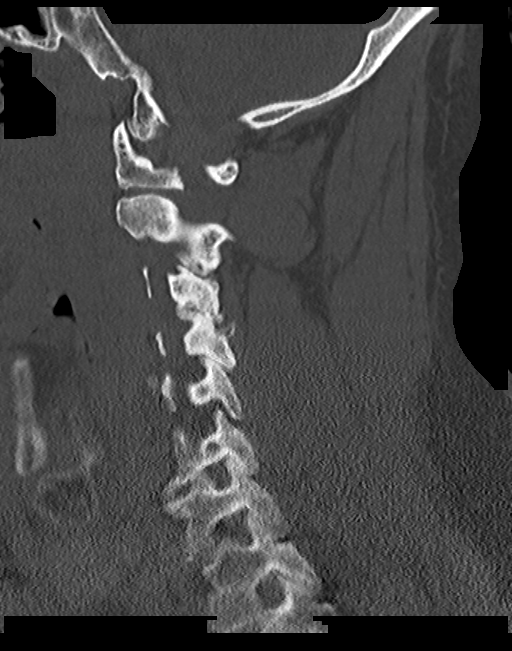
[im 42/63  bone]
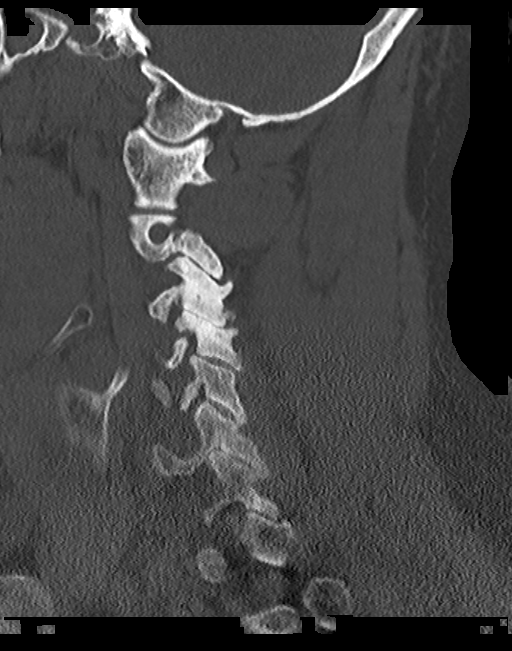

[13 of 33 positions shown; findings below may reference images not displayed]

FINDINGS: C4 vertebral body is sclerotic corresponding to the MRI finding.
This appears to be related to C5-6 disc degeneration as there is
similar sclerotic change in the C5 vertebral body. No evidence of
metastatic disease. Mild flattening of C6 vertebral body may be
related to developmental variation or a chronic fracture. No acute
fracture

C2-3:  Mild disc and facet degeneration

C3-4: Disc degeneration and uncinate spurring. Marked left facet
degeneration with cartilage loss, bony sclerosis, and subarticular
cystic change. This corresponds to the abnormality on MRI.

C4-5: Disc degeneration and spondylosis with diffuse uncinate
spurring causing mild spinal stenosis. Mild facet degeneration

C5-6:  Mild disc degeneration.  Moderate right facet hypertrophy

C6-7: Disc degeneration and spondylosis causing moderate spinal
stenosis. There is significant narrowing of the canal due to
spurring. There is foraminal narrowing bilaterally.

C7-T1:  Negative
IMPRESSION: Negative for acute fracture or metastatic disease. MRI findings are
consistent with degenerative change C4-5 disc space and facet on the
left at C3-4.

Multilevel degenerative change. There is moderate spinal stenosis at
C6-7 due to spurring.

## 2015-06-05 ENCOUNTER — Ambulatory Visit: Payer: Self-pay | Admitting: Nurse Practitioner

## 2015-06-20 ENCOUNTER — Ambulatory Visit: Payer: Self-pay | Admitting: Nurse Practitioner

## 2015-07-18 ENCOUNTER — Encounter (HOSPITAL_COMMUNITY): Payer: Self-pay | Admitting: Psychiatry

## 2015-07-18 ENCOUNTER — Ambulatory Visit (INDEPENDENT_AMBULATORY_CARE_PROVIDER_SITE_OTHER): Payer: Medicaid Other | Admitting: Psychiatry

## 2015-07-18 VITALS — BP 128/74 | HR 108 | Ht 71.0 in | Wt 252.0 lb

## 2015-07-18 DIAGNOSIS — G8929 Other chronic pain: Secondary | ICD-10-CM

## 2015-07-18 DIAGNOSIS — F4310 Post-traumatic stress disorder, unspecified: Secondary | ICD-10-CM

## 2015-07-18 DIAGNOSIS — F17200 Nicotine dependence, unspecified, uncomplicated: Secondary | ICD-10-CM | POA: Diagnosis not present

## 2015-07-18 DIAGNOSIS — F3113 Bipolar disorder, current episode manic without psychotic features, severe: Secondary | ICD-10-CM

## 2015-07-18 MED ORDER — BUSPIRONE HCL 10 MG PO TABS: 10.0000 mg | ORAL_TABLET | Freq: Two times a day (BID) | ORAL | Status: DC

## 2015-07-18 MED ORDER — LAMOTRIGINE 25 MG PO TABS: 50.0000 mg | ORAL_TABLET | Freq: Every day | ORAL | Status: DC
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
## 2015-07-24 ENCOUNTER — Encounter: Payer: Self-pay | Admitting: Family Medicine

## 2015-07-24 ENCOUNTER — Ambulatory Visit (INDEPENDENT_AMBULATORY_CARE_PROVIDER_SITE_OTHER): Payer: Medicaid Other | Admitting: Family Medicine

## 2015-07-24 ENCOUNTER — Encounter (INDEPENDENT_AMBULATORY_CARE_PROVIDER_SITE_OTHER): Payer: Self-pay

## 2015-07-24 ENCOUNTER — Encounter: Payer: Self-pay | Admitting: *Deleted

## 2015-07-24 VITALS — BP 136/73 | HR 110 | Temp 99.8°F | Ht 71.0 in | Wt 260.4 lb

## 2015-07-24 DIAGNOSIS — J449 Chronic obstructive pulmonary disease, unspecified: Secondary | ICD-10-CM | POA: Diagnosis not present

## 2015-07-24 DIAGNOSIS — G894 Chronic pain syndrome: Secondary | ICD-10-CM | POA: Diagnosis not present

## 2015-07-24 DIAGNOSIS — F4310 Post-traumatic stress disorder, unspecified: Secondary | ICD-10-CM

## 2015-07-24 DIAGNOSIS — IMO0001 Reserved for inherently not codable concepts without codable children: Secondary | ICD-10-CM

## 2015-07-24 DIAGNOSIS — I10 Essential (primary) hypertension: Secondary | ICD-10-CM | POA: Diagnosis not present

## 2015-07-24 DIAGNOSIS — R1013 Epigastric pain: Secondary | ICD-10-CM

## 2015-07-24 MED ORDER — LISINOPRIL-HYDROCHLOROTHIAZIDE 20-25 MG PO TABS: 1.0000 | ORAL_TABLET | Freq: Every day | ORAL | Status: DC

## 2015-07-24 MED ORDER — OMEPRAZOLE 40 MG PO CPDR: 40.0000 mg | DELAYED_RELEASE_CAPSULE | Freq: Every day | ORAL | Status: DC

## 2015-07-24 MED ORDER — BUSPIRONE HCL 10 MG PO TABS: 10.0000 mg | ORAL_TABLET | Freq: Two times a day (BID) | ORAL | Status: DC

## 2015-07-24 MED ORDER — TIOTROPIUM BROMIDE MONOHYDRATE 18 MCG IN CAPS: 18.0000 ug | ORAL_CAPSULE | Freq: Every day | RESPIRATORY_TRACT | Status: DC

## 2015-07-24 MED ORDER — ONDANSETRON 4 MG PO TBDP: 4.0000 mg | ORAL_TABLET | Freq: Three times a day (TID) | ORAL | Status: DC | PRN

## 2015-07-24 MED ORDER — OXYCODONE HCL 5 MG PO TABS: 5.0000 mg | ORAL_TABLET | ORAL | Status: DC | PRN

## 2015-07-24 MED ORDER — AMITRIPTYLINE HCL 50 MG PO TABS: 50.0000 mg | ORAL_TABLET | Freq: Every day | ORAL | Status: DC

## 2015-07-24 NOTE — Progress Notes (Signed)
BP 136/73 mmHg  Pulse 110  Temp(Src) 99.8 F (37.7 C) (Oral)  Ht 5\' 11"  (1.803 m)  Wt 260 lb 6.4 oz (118.117 kg)  BMI 36.33 kg/m2   Subjective:    Patient ID: Oscar Hall, male    DOB: [birth year], 55 y.o.   MRN: [id number]  HPI: Oscar Hall is a 55 y.o. male presenting on 07/24/2015 for Hypertension and COPD   HPI Hypertension Patient is coming in for a hypertension recheck today. His blood pressure is 136/73. He is currently taking lisinopril-hydrochlorothiazide. Patient denies headaches, blurred vision, chest pains, shortness of breath, or weakness. Denies any side effects from medication and is content with current medication.   COPD recheck Patient is coming in for his COPD recheck. He is currently using his albuterol inhaler 2-3 times per week. He is using his Spiriva daily and feels like it is helping him greatly. He is still smoking and is trying to quit but does not want to do any medications to help with her currently.  PTSD/anxiety/bipolar/chronic pain. Patient feels like he's been very stressed and that is also been associated with an increase in pain over the last couple weeks. He continues to see psychiatry for this. He just feels like his anxiety has reached at [facility] over the past couple weeks. He has been having associated increase in pain with this anxiety increase which is something that he typically has. His pain is where it is chronically typically in his right knee in his lower back.  Relevant past medical, surgical, family and social history reviewed and updated as indicated. Interim medical history since our last visit reviewed. Allergies and medications reviewed and updated.  Review of Systems  Constitutional: Negative for fever.  HENT: Negative for ear discharge and ear pain.   Eyes: Negative for discharge and visual disturbance.  Respiratory: Negative for shortness of breath and wheezing.   Cardiovascular: Negative for chest pain and leg swelling.    Gastrointestinal: Negative for abdominal pain, diarrhea and constipation.  Genitourinary: Negative for difficulty urinating.  Musculoskeletal: Positive for myalgias, back pain and arthralgias. Negative for joint swelling, gait problem and neck pain.  Skin: Negative for rash.  Neurological: Negative for syncope, light-headedness and headaches.  Psychiatric/Behavioral: Positive for sleep disturbance, dysphoric mood and decreased concentration. Negative for suicidal ideas, hallucinations, confusion and self-injury. The patient is nervous/anxious.   All other systems reviewed and are negative.   Per HPI unless specifically indicated above     Medication List       This list is accurate as of: 07/24/15  3:38 PM.  Always use your most recent med list.               albuterol 108 (90 Base) MCG/ACT inhaler  Commonly known as:  PROVENTIL HFA;VENTOLIN HFA  Inhale 2 puffs into the lungs every 6 (six) hours as needed for wheezing or shortness of breath.     amitriptyline 50 MG tablet  Commonly known as:  ELAVIL  Take 1 tablet (50 mg total) by mouth at bedtime.     busPIRone 10 MG tablet  Commonly known as:  BUSPAR  Take 1 tablet (10 mg total) by mouth 2 (two) times daily.     ezetimibe-simvastatin 10-10 MG tablet  Commonly known as:  VYTORIN  Take 1 tablet by mouth at bedtime.     lamoTRIgine 25 MG tablet  Commonly known as:  LAMICTAL  Take 2 tablets (50 mg total) by mouth daily.  lisinopril-hydrochlorothiazide 20-25 MG tablet  Commonly known as:  PRINZIDE,ZESTORETIC  Take 1 tablet by mouth daily.     omeprazole 40 MG capsule  Commonly known as:  PRILOSEC  Take 1 capsule (40 mg total) by mouth daily.     ondansetron 4 MG disintegrating tablet  Commonly known as:  ZOFRAN-ODT  Take 1 tablet (4 mg total) by mouth every 8 (eight) hours as needed for nausea or vomiting.     oxyCODONE 5 MG immediate release tablet  Commonly known as:  ROXICODONE  Take 1-2 tablets (5-10 mg  total) by mouth every 4 (four) hours as needed for moderate pain or severe pain. Pain     tiotropium 18 MCG inhalation capsule  Commonly known as:  SPIRIVA HANDIHALER  Place 1 capsule (18 mcg total) into inhaler and inhale daily.           Objective:    BP 136/73 mmHg  Pulse 110  Temp(Src) 99.8 F (37.7 C) (Oral)  Ht 5\' 11"  (1.803 m)  Wt 260 lb 6.4 oz (118.117 kg)  BMI 36.33 kg/m2  Wt Readings from Last 3 Encounters:  07/24/15 260 lb 6.4 oz (118.117 kg)  07/18/15 252 lb (114.306 kg)  04/22/15 252 lb 3.2 oz (114.397 kg)    Physical Exam  Constitutional: He is oriented to person, place, and time. He appears well-developed and well-nourished. No distress.  HENT:  Right Ear: External ear normal.  Left Ear: External ear normal.  Nose: Nose normal.  Mouth/Throat: Oropharynx is clear and moist. No oropharyngeal exudate.  Eyes: Conjunctivae and EOM are normal. Pupils are equal, round, and reactive to light. Right eye exhibits no discharge. No scleral icterus.  Neck: Neck supple. No thyromegaly present.  Cardiovascular: Normal rate, regular rhythm, normal heart sounds and intact distal pulses.   No murmur heard. Pulmonary/Chest: Effort normal and breath sounds normal. No respiratory distress. He has no wheezes.  Musculoskeletal: Normal range of motion. He exhibits no edema.       Right knee: He exhibits normal range of motion, no deformity and no erythema. No tenderness (no tenderness on exam) found.       Lumbar back: He exhibits tenderness (b/l paraspinal tenderness). He exhibits normal range of motion, no bony tenderness, no swelling, no deformity, no laceration and no pain.  Lymphadenopathy:    He has no cervical adenopathy.  Neurological: He is alert and oriented to person, place, and time. Coordination normal.  Skin: Skin is warm and dry. No rash noted. He is not diaphoretic.  Psychiatric: He has a normal mood and affect. His behavior is normal.  Vitals reviewed.   Results  for orders placed or performed in visit on 04/26/15  HCV RNA quant  Result Value Ref Range   Hepatitis C Quantitation 7540000 IU/mL   HCV log10 6.877 log10 IU/mL   Test Information Comment   POCT glycosylated hemoglobin (Hb A1C)  Result Value Ref Range   Hemoglobin A1C 5.1       Assessment & Plan:   Problem List Items Addressed This Visit      Cardiovascular and Mediastinum   Essential hypertension   Relevant Medications   lisinopril-hydrochlorothiazide (PRINZIDE,ZESTORETIC) 20-25 MG tablet     Respiratory   COPD bronchitis - Primary   Relevant Medications   tiotropium (SPIRIVA HANDIHALER) 18 MCG inhalation capsule     Other   Chronic pain associated with significant psychosocial dysfunction   Relevant Orders   Ambulatory referral to Pain Clinic   PTSD (  post-traumatic stress disorder)   Abdominal pain   Relevant Medications   oxyCODONE (ROXICODONE) 5 MG immediate release tablet   ondansetron (ZOFRAN-ODT) 4 MG disintegrating tablet   omeprazole (PRILOSEC) 40 MG capsule       Follow up plan: Return in about 4 weeks (around 08/21/2015), or if symptoms worsen or fail to improve.  Counseling provided for all of the vaccine components Orders Placed This Encounter  Procedures  . Ambulatory referral to [place], MD [facility] 07/24/2015, 3:38 PM

## 2015-08-06 ENCOUNTER — Encounter: Payer: Self-pay | Admitting: Family Medicine

## 2015-08-06 ENCOUNTER — Ambulatory Visit (INDEPENDENT_AMBULATORY_CARE_PROVIDER_SITE_OTHER): Payer: Medicaid Other | Admitting: Family Medicine

## 2015-08-06 VITALS — BP 157/102 | HR 101 | Temp 97.6°F | Ht 71.0 in | Wt 268.2 lb

## 2015-08-06 DIAGNOSIS — M79606 Pain in leg, unspecified: Secondary | ICD-10-CM | POA: Diagnosis not present

## 2015-08-06 DIAGNOSIS — I872 Venous insufficiency (chronic) (peripheral): Secondary | ICD-10-CM

## 2015-08-06 DIAGNOSIS — G894 Chronic pain syndrome: Secondary | ICD-10-CM | POA: Diagnosis not present

## 2015-08-06 NOTE — Progress Notes (Signed)
   HPI  Patient presents today for leg pain and swelling.  Explains that over the last few weeks she's had extreme bilateral lower extremity pain. He states that this is been accompanied with leg swelling that's bilateral. His right leg hurts slightly worse in the left leg. He describes sharp intense pain immediately upon standing and walking. He states that he's had chronic pain however this is different than that.  He explains also he's been placed on Medicaid lock-in program.  He is awaiting pain management referral.  He does have relief with lying down, elevating his legs, heat, and rest.  He is currently out of oxycodone that was prescribed 13 days ago.  Reports cold blue feet intermittently during this time.  PMH: Smoking status noted ROS: Per HPI  Objective: BP 157/102 mmHg  Pulse 101  Temp(Src) 97.6 F (36.4 C) (Oral)  Ht 5\' 11"  (1.803 m)  Wt 268 lb 3.2 oz (121.655 kg)  BMI 37.42 kg/m2 Gen: NAD, alert, cooperative with exam HEENT: NCAT CV: RRR, good S1/S2, no murmur Resp: CTABL, no wheezes, non-labored Ext: Trace pitting edema bilateral lower extremities, severe varicose veins bilateral lower extremities, circumference of right leg is 45 cm, left leg is 45.5 cm. Right ankle appears slightly more swollen than the left ankle, however not more pending. 2+ dorsalis pedis pulses, brisk capillary refill, warm feet, normal color Neuro: Alert and oriented, No gross deficits  Assessment and plan:  # Leg swelling and leg pain. Likely mild venous insufficiency, possibly combined with hyperalgesia Given that he reports cold blue feet intermittently throughout this time I have arranged ABIs. I also considered DVT, however his calf circumferences symmetric and he has no erythema. Considering that being the most likely etiology is venous insufficiency I have also given him a handwritten prescription for compression stockings and explained where to get them.   Laroy Apple,  MD [facility] 08/06/2015, 4:01 PM

## 2015-08-06 NOTE — Patient Instructions (Signed)
Great to meet you!  For your leg pain I am setting up a test to look at your circulation in your legs.   I am also prescribing compression stockings which can reduce swelling and help the pain.   Try them very early in the day and wear them throughout the day.

## 2015-08-22 DIAGNOSIS — B1920 Unspecified viral hepatitis C without hepatic coma: Secondary | ICD-10-CM

## 2015-08-22 HISTORY — DX: Unspecified viral hepatitis C without hepatic coma: B19.20

## 2015-09-30 ENCOUNTER — Telehealth: Payer: Self-pay | Admitting: Family Medicine

## 2015-10-10 ENCOUNTER — Ambulatory Visit (HOSPITAL_COMMUNITY): Payer: Self-pay | Admitting: Psychiatry

## 2015-10-24 ENCOUNTER — Encounter: Payer: Self-pay | Admitting: Family Medicine

## 2015-10-24 ENCOUNTER — Ambulatory Visit (INDEPENDENT_AMBULATORY_CARE_PROVIDER_SITE_OTHER): Payer: Medicaid Other | Admitting: Family Medicine

## 2015-10-24 ENCOUNTER — Telehealth: Payer: Self-pay

## 2015-10-24 ENCOUNTER — Ambulatory Visit (HOSPITAL_COMMUNITY): Admission: RE | Admit: 2015-10-24 | Payer: Medicaid Other | Source: Ambulatory Visit

## 2015-10-24 VITALS — BP 120/71 | HR 108 | Temp 97.8°F | Ht 71.0 in | Wt 262.0 lb

## 2015-10-24 DIAGNOSIS — I10 Essential (primary) hypertension: Secondary | ICD-10-CM

## 2015-10-24 DIAGNOSIS — M79604 Pain in right leg: Secondary | ICD-10-CM | POA: Diagnosis not present

## 2015-10-24 DIAGNOSIS — R1013 Epigastric pain: Secondary | ICD-10-CM | POA: Diagnosis not present

## 2015-10-24 DIAGNOSIS — J449 Chronic obstructive pulmonary disease, unspecified: Secondary | ICD-10-CM | POA: Diagnosis not present

## 2015-10-24 DIAGNOSIS — IMO0001 Reserved for inherently not codable concepts without codable children: Secondary | ICD-10-CM

## 2015-10-24 DIAGNOSIS — D492 Neoplasm of unspecified behavior of bone, soft tissue, and skin: Secondary | ICD-10-CM

## 2015-10-24 DIAGNOSIS — Z1211 Encounter for screening for malignant neoplasm of colon: Secondary | ICD-10-CM

## 2015-10-24 MED ORDER — OXYCODONE HCL 5 MG PO TABS: 5.0000 mg | ORAL_TABLET | ORAL | 0 refills | Status: DC | PRN

## 2015-10-24 MED ORDER — OMEPRAZOLE 40 MG PO CPDR: 40.0000 mg | DELAYED_RELEASE_CAPSULE | Freq: Every day | ORAL | 2 refills | Status: DC

## 2015-10-24 MED ORDER — ALBUTEROL SULFATE HFA 108 (90 BASE) MCG/ACT IN AERS: 2.0000 | INHALATION_SPRAY | Freq: Four times a day (QID) | RESPIRATORY_TRACT | 3 refills | Status: AC | PRN

## 2015-10-24 MED ORDER — TIOTROPIUM BROMIDE MONOHYDRATE 18 MCG IN CAPS: 18.0000 ug | ORAL_CAPSULE | Freq: Every day | RESPIRATORY_TRACT | 3 refills | Status: DC

## 2015-10-24 MED ORDER — SIMVASTATIN 10 MG PO TABS: 10.0000 mg | ORAL_TABLET | Freq: Every day | ORAL | 1 refills | Status: DC

## 2015-10-24 MED ORDER — LISINOPRIL-HYDROCHLOROTHIAZIDE 20-25 MG PO TABS: 1.0000 | ORAL_TABLET | Freq: Every day | ORAL | 3 refills | Status: DC

## 2015-10-24 MED ORDER — AMITRIPTYLINE HCL 50 MG PO TABS: 50.0000 mg | ORAL_TABLET | Freq: Every day | ORAL | 2 refills | Status: DC

## 2015-10-24 MED ORDER — EZETIMIBE-SIMVASTATIN 10-10 MG PO TABS: 1.0000 | ORAL_TABLET | Freq: Every day | ORAL | 3 refills | Status: DC

## 2015-10-24 NOTE — Telephone Encounter (Signed)
Rx called into the pharmacy, lmtcb for patient.

## 2015-10-24 NOTE — Progress Notes (Signed)
BP 120/71 (BP Location: Left Arm, Patient Position: Sitting, Cuff Size: Normal)   Pulse (!) 108   Temp 97.8 F (36.6 C) (Oral)   Ht _0  (1.803 m)   Wt 262 lb (118.8 kg)   BMI 36.54 kg/m    Subjective:    Patient ID: Oscar Hall, male    DOB: [birth year], 55 y.o.   MRN: [id number]  HPI: Oscar Hall is a 55 y.o. male presenting on 10/24/2015 for Hypertension (3 mos ckup) and COPD   HPI Hypertension Patient is coming in today for a checkup on his hypertension. His blood pressure today is 120/71. Patient is currently taking lisinopril-hydrochlorothiazide. He denies any issues with medication.  COPD recheck Patient is doing well on his Spiriva. He is currently only having to use his albuterol rescue inhaler 1-2 times per week. The rest the time he does really well and is not having too many wheezing episodes of coughing spells or shortness of breath outside of those 2 times a week. He denies any fevers or chills or shortness of breath or wheezing currently.  Rectal pain and skin lesion Patient is coming in today also with complaints of rectal pain and the skin lesion. He was felt had hemorrhoids in the past and been using hydrocortisone cream Preparation H and it has not been helping much. He would like to get examined today and see what is going on.  Chronic pain/epigastric/back pain Patient would like a few pain pills to trying get him tight over until he can get into pain management. His most significant pain is a tenderness in his lower back and his abdomen when it flares up.  Right leg pain and swelling Patient has been having right lower leg pain and calf pain and swelling below the calf has been going on for a couple weeks. He denies any loss of sensation or weakness in that leg.  Relevant past medical, surgical, family and social history reviewed and updated as indicated. Interim medical history since our last visit reviewed. Allergies and medications reviewed and  updated.  Review of Systems  Constitutional: Negative for fever.  HENT: Negative for ear discharge and ear pain.   Eyes: Negative for discharge and visual disturbance.  Respiratory: Negative for shortness of breath and wheezing.   Cardiovascular: Positive for leg swelling. Negative for chest pain.  Gastrointestinal: Positive for abdominal pain (Chronic but improved since cholecystectomy) and rectal pain. Negative for blood in stool, constipation, diarrhea and nausea.  Genitourinary: Negative for difficulty urinating.  Musculoskeletal: Positive for arthralgias and back pain. Negative for gait problem.  Skin: Negative for rash.  Neurological: Negative for syncope, light-headedness and headaches.  All other systems reviewed and are negative.   Per HPI unless specifically indicated above     Medication List       Accurate as of 10/24/15  2:06 PM. Always use your most recent med list.          albuterol 108 (90 Base) MCG/ACT inhaler Commonly known as:  PROVENTIL HFA;VENTOLIN HFA Inhale 2 puffs into the lungs every 6 (six) hours as needed for wheezing or shortness of breath.   amitriptyline 50 MG tablet Commonly known as:  ELAVIL Take 1 tablet (50 mg total) by mouth at bedtime.   busPIRone 10 MG tablet Commonly known as:  BUSPAR Take 1 tablet (10 mg total) by mouth 2 (two) times daily.   ezetimibe-simvastatin 10-10 MG tablet Commonly known as:  VYTORIN Take 1 tablet by mouth at bedtime.  lamoTRIgine 25 MG tablet Commonly known as:  LAMICTAL Take 2 tablets (50 mg total) by mouth daily.   lisinopril-hydrochlorothiazide 20-25 MG tablet Commonly known as:  PRINZIDE,ZESTORETIC Take 1 tablet by mouth daily.   omeprazole 40 MG capsule Commonly known as:  PRILOSEC Take 1 capsule (40 mg total) by mouth daily.   ondansetron 4 MG disintegrating tablet Commonly known as:  ZOFRAN-ODT Take 1 tablet (4 mg total) by mouth every 8 (eight) hours as needed for nausea or vomiting.    oxyCODONE 5 MG immediate release tablet Commonly known as:  ROXICODONE Take 1-2 tablets (5-10 mg total) by mouth every 4 (four) hours as needed for moderate pain or severe pain. Pain   tiotropium 18 MCG inhalation capsule Commonly known as:  SPIRIVA HANDIHALER Place 1 capsule (18 mcg total) into inhaler and inhale daily.          Objective:    BP 120/71 (BP Location: Left Arm, Patient Position: Sitting, Cuff Size: Normal)   Pulse (!) 108   Temp 97.8 F (36.6 C) (Oral)   Ht _0  (1.803 m)   Wt 262 lb (118.8 kg)   BMI 36.54 kg/m   Wt Readings from Last 3 Encounters:  10/24/15 262 lb (118.8 kg)  08/06/15 268 lb 3.2 oz (121.7 kg)  07/24/15 260 lb 6.4 oz (118.1 kg)    Physical Exam  Constitutional: He is oriented to person, place, and time. He appears well-developed and well-nourished. No distress.  Eyes: Conjunctivae and EOM are normal. Pupils are equal, round, and reactive to light. Right eye exhibits no discharge. No scleral icterus.  Neck: Neck supple. No thyromegaly present.  Cardiovascular: Normal rate, regular rhythm, normal heart sounds and intact distal pulses.   No murmur heard. Pulmonary/Chest: Effort normal and breath sounds normal. No respiratory distress. He has no wheezes. He has no rales.  Musculoskeletal: Normal range of motion.       Lumbar back: He exhibits tenderness (Same chronic pain is been having, negative straight leg raise). He exhibits normal range of motion.       Right lower leg: He exhibits swelling and edema (1+). He exhibits no tenderness.  Lymphadenopathy:    He has no cervical adenopathy.  Neurological: He is alert and oriented to person, place, and time. Coordination normal.  Skin: Skin is warm and dry. Lesion (Lesion on the left lateral buttocks in the gluteal fold near the rectum that is raised and abnormal contour and shape that could be similar to a genital wart that is very large but also very painful. We will have him go see a  dermatologist) noted. No rash noted. He is not diaphoretic.  Psychiatric: He has a normal mood and affect. His behavior is normal.  Nursing note and vitals reviewed.     Assessment & Plan:   Problem List Items Addressed This Visit      Cardiovascular and Mediastinum   Essential hypertension - Primary   Relevant Medications   lisinopril-hydrochlorothiazide (PRINZIDE,ZESTORETIC) 20-25 MG tablet   ezetimibe-simvastatin (VYTORIN) 10-10 MG tablet   Other Relevant Orders   CMP14+EGFR   Lipid panel     Respiratory   COPD bronchitis   Relevant Medications   albuterol (PROVENTIL HFA;VENTOLIN HFA) 108 (90 Base) MCG/ACT inhaler   tiotropium (SPIRIVA HANDIHALER) 18 MCG inhalation capsule    Other Visit Diagnoses    Abnormal skin growth       Relevant Orders   Ambulatory referral to Dermatology   Ambulatory referral to Gastroenterology  Special screening for malignant neoplasms, colon       Relevant Orders   Ambulatory referral to Gastroenterology   Epigastric pain       doing well, wants zofran refill, had cholecystectomy, refill of medications only   Relevant Medications   oxyCODONE (ROXICODONE) 5 MG immediate release tablet   omeprazole (PRILOSEC) 40 MG capsule   Right leg pain       We'll do ultrasound to make sure that he doesn't have a clots.   Relevant Orders   US Venous Img Lower Unilateral Right       Follow up plan: Return in about 3 months (around 01/24/2016), or if symptoms worsen or fail to improve.  Counseling provided for all of the vaccine components Orders Placed This Encounter  Procedures  . CMP14+EGFR  . Lipid panel  . Ambulatory referral to Dermatology  . Ambulatory referral to Gastroenterology    [name], MD [facility] 10/24/2015, 2:06 PM

## 2015-10-24 NOTE — Telephone Encounter (Signed)
Go ahead and send him simvastatin 10 mg and given 6 months worth

## 2015-10-25 ENCOUNTER — Ambulatory Visit (HOSPITAL_COMMUNITY)
Admission: RE | Admit: 2015-10-25 | Discharge: 2015-10-25 | Disposition: A | Discharge status: Home / Self Care | Payer: Medicaid Other | Source: Ambulatory Visit | Attending: Family Medicine | Admitting: Family Medicine

## 2015-10-25 DIAGNOSIS — M79604 Pain in right leg: Secondary | ICD-10-CM | POA: Diagnosis present

## 2015-11-05 NOTE — Telephone Encounter (Signed)
Pt aware.

## 2015-11-27 ENCOUNTER — Encounter (HOSPITAL_COMMUNITY): Payer: Self-pay | Admitting: Psychiatry

## 2015-11-27 ENCOUNTER — Ambulatory Visit (INDEPENDENT_AMBULATORY_CARE_PROVIDER_SITE_OTHER): Payer: Medicaid Other | Admitting: Psychiatry

## 2015-11-27 VITALS — BP 128/80 | HR 99 | Ht 71.0 in | Wt 256.0 lb

## 2015-11-27 DIAGNOSIS — F329 Major depressive disorder, single episode, unspecified: Secondary | ICD-10-CM

## 2015-11-27 DIAGNOSIS — F1021 Alcohol dependence, in remission: Secondary | ICD-10-CM | POA: Diagnosis not present

## 2015-11-27 DIAGNOSIS — F4310 Post-traumatic stress disorder, unspecified: Secondary | ICD-10-CM | POA: Diagnosis not present

## 2015-11-27 DIAGNOSIS — G894 Chronic pain syndrome: Secondary | ICD-10-CM

## 2015-11-27 DIAGNOSIS — F3113 Bipolar disorder, current episode manic without psychotic features, severe: Secondary | ICD-10-CM

## 2015-11-27 MED ORDER — LAMOTRIGINE 25 MG PO TABS: 50.0000 mg | ORAL_TABLET | Freq: Every day | ORAL | 2 refills | Status: DC
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
## 2015-12-30 ENCOUNTER — Ambulatory Visit (INDEPENDENT_AMBULATORY_CARE_PROVIDER_SITE_OTHER): Payer: Medicaid Other | Admitting: Family Medicine

## 2015-12-30 ENCOUNTER — Encounter: Payer: Self-pay | Admitting: Family Medicine

## 2015-12-30 VITALS — BP 134/87 | HR 108 | Temp 97.1°F | Ht 71.0 in | Wt 267.0 lb

## 2015-12-30 DIAGNOSIS — I10 Essential (primary) hypertension: Secondary | ICD-10-CM | POA: Diagnosis not present

## 2015-12-30 DIAGNOSIS — Z114 Encounter for screening for human immunodeficiency virus [HIV]: Secondary | ICD-10-CM | POA: Diagnosis not present

## 2015-12-30 DIAGNOSIS — R1013 Epigastric pain: Secondary | ICD-10-CM

## 2015-12-30 DIAGNOSIS — D492 Neoplasm of unspecified behavior of bone, soft tissue, and skin: Secondary | ICD-10-CM

## 2015-12-30 DIAGNOSIS — J439 Emphysema, unspecified: Secondary | ICD-10-CM | POA: Diagnosis not present

## 2015-12-30 DIAGNOSIS — E785 Hyperlipidemia, unspecified: Secondary | ICD-10-CM

## 2015-12-30 MED ORDER — SIMVASTATIN 10 MG PO TABS: 10.0000 mg | ORAL_TABLET | Freq: Every day | ORAL | 3 refills | Status: DC

## 2015-12-30 MED ORDER — AMITRIPTYLINE HCL 50 MG PO TABS: 50.0000 mg | ORAL_TABLET | Freq: Every day | ORAL | 3 refills | Status: DC

## 2015-12-30 MED ORDER — LISINOPRIL-HYDROCHLOROTHIAZIDE 20-25 MG PO TABS: 1.0000 | ORAL_TABLET | Freq: Every day | ORAL | 3 refills | Status: DC

## 2015-12-30 MED ORDER — OXYCODONE HCL 5 MG PO TABS: 5.0000 mg | ORAL_TABLET | Freq: Two times a day (BID) | ORAL | 0 refills | Status: DC | PRN

## 2015-12-30 MED ORDER — TIOTROPIUM BROMIDE MONOHYDRATE 18 MCG IN CAPS: 18.0000 ug | ORAL_CAPSULE | Freq: Every day | RESPIRATORY_TRACT | 3 refills | Status: DC

## 2015-12-30 MED ORDER — BENZOCAINE-MENTHOL 20-0.5 % EX AERO: 1.0000 "application " | INHALATION_SPRAY | Freq: Four times a day (QID) | CUTANEOUS | 1 refills | Status: DC | PRN

## 2015-12-30 MED ORDER — OMEPRAZOLE 40 MG PO CPDR: 40.0000 mg | DELAYED_RELEASE_CAPSULE | Freq: Every day | ORAL | 3 refills | Status: DC

## 2015-12-30 NOTE — Progress Notes (Signed)
BP 134/87   Pulse (!) 108   Temp 97.1 F (36.2 C) (Oral)   Ht '5\' 11"'  (1.803 m)   Wt 267 lb (121.1 kg)   BMI 37.24 kg/m    Subjective:    Patient ID: Oscar Hall, male    DOB: 09/03/1960, 55 y.o.   MRN: [id number]  HPI: Oscar Hall is a 55 y.o. male presenting on 12/30/2015 for Medication Refill (Simvastatin, amitriptyline, lisinopril-hct, omeprazole, Spiriva) and Has a growth on rectum (dermatologist sent him to a surgeon who he sees on 10/21, will do labs and schedule date for surgery then)   HPI Hypertension recheck Patient comes in today for hypertension recheck. His blood pressures 134/87. He is currently on lisinopril-hydrochlorothiazide. Patient denies headaches, blurred vision, chest pains, shortness of breath, or weakness. Denies any side effects from medication and is content with current medication.   COPD recheck Patient is coming in for a COPD recheck today. He is using his albuterol rescue inhaler about 2-3 times a week. He is also currently on his Spiriva and feels like it is doing very well for him. He denies any shortness of breath or wheezing today.  Epigastric pain and nausea Patient is coming in for epigastric pain and nausea and just wanted a refill on his Zofran is been on it ever since he had his cholecystectomy.  Hyperlipidemia recheck Patient is coming in for cholesterol recheck today as well. He is currently on simvastatin 10 mg. He denies any issues such as myalgias with his medications.  Abnormal skin growth near his rectum Patient comes in for a new referral on his abnormal rectal skin growth. The dermatologist that is also likely looks like a wart but they did not have the capabilities to biopsy this lesion and recommended see a general surgeon to have the lesion removed as they could freeze it but they were unable to biopsy it can have close it is to the rectum.  Relevant past medical, surgical, family and social history reviewed and updated as  indicated. Interim medical history since our last visit reviewed. Allergies and medications reviewed and updated.  Review of Systems  Constitutional: Negative for fever.  HENT: Negative for ear discharge and ear pain.   Eyes: Negative for discharge and visual disturbance.  Respiratory: Negative for cough, chest tightness, shortness of breath and wheezing.   Cardiovascular: Positive for leg swelling. Negative for chest pain.  Gastrointestinal: Positive for abdominal pain (Chronic but improved since cholecystectomy) and rectal pain. Negative for blood in stool, constipation, diarrhea and nausea.  Genitourinary: Negative for difficulty urinating.  Musculoskeletal: Negative for gait problem.  Skin: Negative for rash.  Neurological: Negative for syncope, light-headedness and headaches.  All other systems reviewed and are negative.   Per HPI unless specifically indicated above     Medication List       Accurate as of 12/30/15  5:13 PM. Always use your most recent med list.          albuterol 108 (90 Base) MCG/ACT inhaler Commonly known as:  PROVENTIL HFA;VENTOLIN HFA Inhale 2 puffs into the lungs every 6 (six) hours as needed for wheezing or shortness of breath.   amitriptyline 50 MG tablet Commonly known as:  ELAVIL Take 1 tablet (50 mg total) by mouth at bedtime.   benzocaine-Menthol 20-0.5 % Aero Commonly known as:  DERMOPLAST Apply 1 application topically 4 (four) times daily as needed for irritation.   busPIRone 10 MG tablet Commonly known as:  BUSPAR Take 1  tablet (10 mg total) by mouth 2 (two) times daily.   lamoTRIgine 25 MG tablet Commonly known as:  LAMICTAL Take 2 tablets (50 mg total) by mouth daily.   lisinopril-hydrochlorothiazide 20-25 MG tablet Commonly known as:  PRINZIDE,ZESTORETIC Take 1 tablet by mouth daily.   omeprazole 40 MG capsule Commonly known as:  PRILOSEC Take 1 capsule (40 mg total) by mouth daily.   oxyCODONE 5 MG immediate release  tablet Commonly known as:  ROXICODONE Take 1 tablet (5 mg total) by mouth 2 (two) times daily as needed for moderate pain or severe pain. Pain   simvastatin 10 MG tablet Commonly known as:  ZOCOR Take 1 tablet (10 mg total) by mouth at bedtime.   tiotropium 18 MCG inhalation capsule Commonly known as:  SPIRIVA HANDIHALER Place 1 capsule (18 mcg total) into inhaler and inhale daily.          Objective:    BP 134/87   Pulse (!) 108   Temp 97.1 F (36.2 C) (Oral)   Ht '5\' 11"'  (1.803 m)   Wt 267 lb (121.1 kg)   BMI 37.24 kg/m   Wt Readings from Last 3 Encounters:  12/30/15 267 lb (121.1 kg)  10/24/15 262 lb (118.8 kg)  08/06/15 268 lb 3.2 oz (121.7 kg)    Physical Exam  Constitutional: He is oriented to person, place, and time. He appears well-developed and well-nourished. No distress.  Eyes: Conjunctivae and EOM are normal. Pupils are equal, round, and reactive to light. Right eye exhibits no discharge. No scleral icterus.  Neck: Neck supple. No thyromegaly present.  Cardiovascular: Normal rate, regular rhythm, normal heart sounds and intact distal pulses.   No murmur heard. Pulmonary/Chest: Effort normal and breath sounds normal. No respiratory distress. He has no wheezes. He has no rales.  Abdominal: Soft. Bowel sounds are normal. He exhibits no distension. There is no tenderness. There is no rebound.  Musculoskeletal: Normal range of motion.       Lumbar back: Tenderness: Same chronic pain is been having, negative straight leg raise.       Right lower leg: Right lower leg edema: 1+  Lymphadenopathy:    He has no cervical adenopathy.  Neurological: He is alert and oriented to person, place, and time. Coordination normal.  Skin: Skin is warm and dry. Lesion (Lesion on the left lateral buttocks in the gluteal fold near the rectum that is raised and abnormal contour and shape that could be similar to a genital wart that is very large but also very painful. We will have him go  see a Education officer, environmental) noted. No rash noted. He is not diaphoretic.  Psychiatric: He has a normal mood and affect. His behavior is normal.  Nursing note and vitals reviewed.     Assessment & Plan:   Problem List Items Addressed This Visit      Cardiovascular and Mediastinum   Essential hypertension - Primary   Relevant Medications   simvastatin (ZOCOR) 10 MG tablet   lisinopril-hydrochlorothiazide (PRINZIDE,ZESTORETIC) 20-25 MG tablet   Other Relevant Orders   CBC with Differential/Platelet   CMP14+EGFR   Lipid panel     Respiratory   COPD (chronic obstructive pulmonary disease) (HCC)   Relevant Medications   tiotropium (SPIRIVA HANDIHALER) 18 MCG inhalation capsule   Other Relevant Orders   CBC with Differential/Platelet    Other Visit Diagnoses    Epigastric pain       doing well, wants zofran refill, had cholecystectomy, refill of  medications only   Relevant Medications   omeprazole (PRILOSEC) 40 MG capsule   oxyCODONE (ROXICODONE) 5 MG immediate release tablet   Hyperlipidemia LDL goal <130       Relevant Medications   simvastatin (ZOCOR) 10 MG tablet   lisinopril-hydrochlorothiazide (PRINZIDE,ZESTORETIC) 20-25 MG tablet   Other Relevant Orders   Lipid panel   Screening for HIV without presence of risk factors       Relevant Orders   HIV antibody   Abnormal skin growth       Likely rectal wart, painful, will give a little bit of oxycodone and dermoPlast       Follow up plan: Return in about 3 months (around 03/31/2016), or if symptoms worsen or fail to improve, for Hypertension recheck and cholesterol and COPD.  Counseling provided for all of the vaccine components Orders Placed This Encounter  Procedures  . CBC with Differential/Platelet  . CMP14+EGFR  . Lipid panel  . HIV antibody    [name], MD [facility] 12/30/2015, 5:13 PM

## 2016-01-08 ENCOUNTER — Other Ambulatory Visit (INDEPENDENT_AMBULATORY_CARE_PROVIDER_SITE_OTHER): Payer: Medicaid Other

## 2016-01-08 DIAGNOSIS — Z114 Encounter for screening for human immunodeficiency virus [HIV]: Secondary | ICD-10-CM

## 2016-01-08 DIAGNOSIS — I10 Essential (primary) hypertension: Secondary | ICD-10-CM

## 2016-01-08 DIAGNOSIS — J439 Emphysema, unspecified: Secondary | ICD-10-CM

## 2016-01-08 DIAGNOSIS — E785 Hyperlipidemia, unspecified: Secondary | ICD-10-CM

## 2016-01-09 LAB — CMP14+EGFR
ALBUMIN: 4.6 g/dL (ref 3.5–5.5)
ALK PHOS: 88 IU/L (ref 39–117)
ALT: 28 IU/L (ref 0–44)
AST: 25 IU/L (ref 0–40)
Albumin/Globulin Ratio: 1.2 (ref 1.2–2.2)
BUN / CREAT RATIO: 14 (ref 9–20)
BUN: 16 mg/dL (ref 6–24)
Bilirubin Total: 0.7 mg/dL (ref 0.0–1.2)
CALCIUM: 10.3 mg/dL — AB (ref 8.7–10.2)
CO2: 26 mmol/L (ref 18–29)
CREATININE: 1.17 mg/dL (ref 0.76–1.27)
Chloride: 89 mmol/L — ABNORMAL LOW (ref 96–106)
GFR calc non Af Amer: 70 mL/min/{1.73_m2} (ref >59)
GFR, EST AFRICAN AMERICAN: 81 mL/min/{1.73_m2} (ref >59)
GLUCOSE: 119 mg/dL — AB (ref 65–99)
Globulin, Total: 3.9 g/dL (ref 1.5–4.5)
Potassium: 4.4 mmol/L (ref 3.5–5.2)
Sodium: 134 mmol/L (ref 134–144)
TOTAL PROTEIN: 8.5 g/dL (ref 6.0–8.5)

## 2016-01-09 LAB — CBC WITH DIFFERENTIAL/PLATELET
BASOS: 0 %
Basophils Absolute: 0.1 10*3/uL (ref 0.0–0.2)
EOS (ABSOLUTE): 0.5 10*3/uL — ABNORMAL HIGH (ref 0.0–0.4)
Eos: 4 %
HEMOGLOBIN: 18.2 g/dL — AB (ref 12.6–17.7)
Hematocrit: 49.1 % (ref 37.5–51.0)
IMMATURE GRANS (ABS): 0 10*3/uL (ref 0.0–0.1)
Immature Granulocytes: 0 %
LYMPHS: 26 %
Lymphocytes Absolute: 3.4 10*3/uL — ABNORMAL HIGH (ref 0.7–3.1)
MCH: 33.8 pg — ABNORMAL HIGH (ref 26.6–33.0)
MCHC: 37.1 g/dL — ABNORMAL HIGH (ref 31.5–35.7)
MCV: 91 fL (ref 79–97)
MONOCYTES: 7 %
Monocytes Absolute: 0.9 10*3/uL (ref 0.1–0.9)
NEUTROS ABS: 8.4 10*3/uL — AB (ref 1.4–7.0)
Neutrophils: 63 %
Platelets: 339 10*3/uL (ref 150–379)
RBC: 5.39 x10E6/uL (ref 4.14–5.80)
RDW: 12.4 % (ref 12.3–15.4)
WBC: 13.2 10*3/uL — ABNORMAL HIGH (ref 3.4–10.8)

## 2016-01-09 LAB — LIPID PANEL
CHOLESTEROL TOTAL: 211 mg/dL — AB (ref 100–199)
Chol/HDL Ratio: 6.2 ratio units — ABNORMAL HIGH (ref 0.0–5.0)
HDL: 34 mg/dL — ABNORMAL LOW (ref >39)
LDL CALC: 148 mg/dL — AB (ref 0–99)
Triglycerides: 143 mg/dL (ref 0–149)
VLDL CHOLESTEROL CAL: 29 mg/dL (ref 5–40)

## 2016-01-09 LAB — HIV ANTIBODY (ROUTINE TESTING W REFLEX): HIV SCREEN 4TH GENERATION: NONREACTIVE

## 2016-01-21 ENCOUNTER — Telehealth: Payer: Self-pay | Admitting: Family Medicine

## 2016-01-21 NOTE — Telephone Encounter (Signed)
Pt has episodes of nausea since having cholcystectomy Wants rx for Zofran Please advise

## 2016-01-22 ENCOUNTER — Other Ambulatory Visit: Payer: Self-pay | Admitting: Family Medicine

## 2016-01-22 DIAGNOSIS — R1013 Epigastric pain: Secondary | ICD-10-CM

## 2016-01-23 MED ORDER — ONDANSETRON HCL 4 MG PO TABS: 4.0000 mg | ORAL_TABLET | Freq: Three times a day (TID) | ORAL | 0 refills | Status: DC | PRN

## 2016-01-23 NOTE — Telephone Encounter (Signed)
zofran  Prescription sent to pharmacy   

## 2016-01-23 NOTE — Telephone Encounter (Signed)
Patient aware via detailed message 

## 2016-01-28 MED ORDER — SIMVASTATIN 20 MG PO TABS: 20.0000 mg | ORAL_TABLET | Freq: Every day | ORAL | 1 refills | Status: DC

## 2016-02-04 ENCOUNTER — Ambulatory Visit (INDEPENDENT_AMBULATORY_CARE_PROVIDER_SITE_OTHER): Payer: Medicaid Other | Admitting: Family Medicine

## 2016-02-04 ENCOUNTER — Encounter: Payer: Self-pay | Admitting: Family Medicine

## 2016-02-04 VITALS — BP 128/87 | HR 108 | Temp 98.0°F | Ht 71.0 in | Wt 263.0 lb

## 2016-02-04 DIAGNOSIS — K6289 Other specified diseases of anus and rectum: Secondary | ICD-10-CM | POA: Diagnosis not present

## 2016-02-04 MED ORDER — OXYCODONE HCL 10 MG PO TABS: 10.0000 mg | ORAL_TABLET | Freq: Two times a day (BID) | ORAL | 0 refills | Status: DC | PRN

## 2016-02-04 NOTE — Progress Notes (Signed)
   BP 128/87   Pulse (!) 108   Temp 98 F (36.7 C) (Oral)   Ht 5\' 11"  (1.803 m)   Wt 263 lb (119.3 kg)   BMI 36.68 kg/m    Subjective:    Patient ID: Oscar Hall, male    DOB: 07-14-1960, 55 y.o.   MRN: [id number]  HPI: Oscar Hall is a 55 y.o. male presenting on 02/04/2016 for Pain after surgery to remove anal lesion   HPI Anal/rectal pain, postsurgical Patient comes in today after having surgery last week to remove the rectal skin lesion/mass that was there. He is having pain 10 out of 10 since the rectal mass was removed. They attempted to give him some pain medication but because he is Medicaid he can only get it through me. He has been keeping a close eye on the site where was removed and he has been having drainage out of it but nonpurulent drainage, there is some erythema around the open site but no signs of infection. He has an ulceration that is left.  Relevant past medical, surgical, family and social history reviewed and updated as indicated. Interim medical history since our last visit reviewed. Allergies and medications reviewed and updated.  Review of Systems  Constitutional: Negative for chills and fever.  Eyes: Negative for discharge.  Respiratory: Negative for shortness of breath and wheezing.   Cardiovascular: Negative for chest pain and leg swelling.  Gastrointestinal: Positive for rectal pain.  Musculoskeletal: Negative for back pain and gait problem.  Skin: Negative for rash.  All other systems reviewed and are negative.   Per HPI unless specifically indicated above     Objective:    BP 128/87   Pulse (!) 108   Temp 98 F (36.7 C) (Oral)   Ht 5\' 11"  (1.803 m)   Wt 263 lb (119.3 kg)   BMI 36.68 kg/m   Wt Readings from Last 3 Encounters:  02/04/16 263 lb (119.3 kg)  12/30/15 267 lb (121.1 kg)  10/24/15 262 lb (118.8 kg)    Physical Exam  Constitutional: He is oriented to person, place, and time. He appears well-developed and well-nourished. No  distress.  Eyes: Conjunctivae are normal. Right eye exhibits no discharge. No scleral icterus.  Musculoskeletal: Normal range of motion. He exhibits no edema.  Neurological: He is alert and oriented to person, place, and time. Coordination normal.  Skin: Skin is warm and dry. Lesion (4 cm wide round ulceration that goes about 2 cm deep. He is going back to see the surgeon) noted. No rash noted. He is not diaphoretic.  Psychiatric: He has a normal mood and affect. His behavior is normal.  Nursing note and vitals reviewed.     Assessment & Plan:   Problem List Items Addressed This Visit    None    Visit Diagnoses    Anal or rectal pain    -  Primary   Postsurgical rectal pain, lesion removed   Relevant Medications   Oxycodone HCl 10 MG TABS       Follow up plan: Return if symptoms worsen or fail to improve.  Counseling provided for all of the vaccine components No orders of the defined types were placed in this encounter.   [name], MD [facility] 02/04/2016, 4:34 PM

## 2016-02-17 ENCOUNTER — Encounter: Payer: Self-pay | Admitting: Family Medicine

## 2016-02-17 ENCOUNTER — Ambulatory Visit (INDEPENDENT_AMBULATORY_CARE_PROVIDER_SITE_OTHER): Payer: Medicaid Other | Admitting: Family Medicine

## 2016-02-17 VITALS — BP 126/80 | HR 102 | Temp 97.3°F | Ht 71.0 in | Wt 256.2 lb

## 2016-02-17 DIAGNOSIS — K6289 Other specified diseases of anus and rectum: Secondary | ICD-10-CM

## 2016-02-17 MED ORDER — OXYCODONE HCL 10 MG PO TABS: 10.0000 mg | ORAL_TABLET | Freq: Two times a day (BID) | ORAL | 0 refills | Status: DC | PRN

## 2016-02-17 NOTE — Progress Notes (Signed)
BP 126/80   Pulse (!) 102   Temp 97.3 F (36.3 C) (Oral)   Ht 5\' 11"  (1.803 m)   Wt 256 lb 4 oz (116.2 kg)   BMI 35.74 kg/m    Subjective:    Patient ID: Oscar Hall, male    DOB: 1960/08/22, 55 y.o.   MRN: [id number]  HPI: Oscar Hall is a 55 y.o. male presenting on 02/17/2016 for Rectal pain and bleeding (still having some pain and some bleeding, surgeon is out of town)   HPI Rectal pain and ulceration Patient is coming in for recheck on his rectal pain ulceration because his surgeon is out of town and he needs something more for his pain. To refill that I gave him last time worked pretty good and he would just like to get some more of that. He also wanted to get a quick check on the site and make sure it's looking good. He also says that he does not have any more money to buy dressing change and supplies likely cause or saline. He was wondering if he can get some of those today. His pain today as an 8 out of 10. He is starting to eat some more but still does not want to eat tons because he will have bowel movements after he eats. He denies any fevers or chills. The site is draining a pink and clear discharge. There is no thick purulent drainage.  Relevant past medical, surgical, family and social history reviewed and updated as indicated. Interim medical history since our last visit reviewed. Allergies and medications reviewed and updated.  Review of Systems  Constitutional: Negative for chills and fever.  Respiratory: Negative for shortness of breath and wheezing.   Cardiovascular: Negative for chest pain and leg swelling.  Musculoskeletal: Negative for back pain and gait problem.  Skin: Positive for wound. Negative for color change and rash.  All other systems reviewed and are negative.   Per HPI unless specifically indicated above      Objective:    BP 126/80   Pulse (!) 102   Temp 97.3 F (36.3 C) (Oral)   Ht 5\' 11"  (1.803 m)   Wt 256 lb 4 oz (116.2 kg)   BMI  35.74 kg/m   Wt Readings from Last 3 Encounters:  02/17/16 256 lb 4 oz (116.2 kg)  02/04/16 263 lb (119.3 kg)  12/30/15 267 lb (121.1 kg)    Physical Exam  Constitutional: He is oriented to person, place, and time. He appears well-developed and well-nourished. No distress.  Eyes: Conjunctivae are normal. Right eye exhibits no discharge. No scleral icterus.  Musculoskeletal: Normal range of motion. He exhibits no edema.  Neurological: He is alert and oriented to person, place, and time. Coordination normal.  Skin: Skin is warm and dry. Lesion (4 cm wide round ulceration that goes about 1 cm deep. He is going back to see the surgeon. Looks like it's about half as deep as before and has good pink granular tissue at the base) noted. No rash noted. He is not diaphoretic.  Psychiatric: He has a normal mood and affect. His behavior is normal.  Nursing note and vitals reviewed.     Assessment & Plan:   Problem List Items Addressed This Visit    None    Visit Diagnoses    Anal or rectal pain    -  Primary   Ulceration after lesion was removed, good clean tissue at base, refill pain medication  Relevant Medications   Oxycodone HCl 10 MG TABS       Follow up plan: Return if symptoms worsen or fail to improve.  Counseling provided for all of the vaccine components No orders of the defined types were placed in this encounter.   [name], MD [facility] 02/17/2016, 11:58 AM

## 2016-02-24 ENCOUNTER — Ambulatory Visit (HOSPITAL_COMMUNITY): Payer: Self-pay | Admitting: Psychiatry

## 2016-02-26 ENCOUNTER — Encounter (HOSPITAL_COMMUNITY): Payer: Self-pay | Admitting: Psychiatry

## 2016-02-26 ENCOUNTER — Ambulatory Visit (INDEPENDENT_AMBULATORY_CARE_PROVIDER_SITE_OTHER): Payer: Medicaid Other | Admitting: Psychiatry

## 2016-02-26 VITALS — BP 138/84 | HR 94 | Resp 18 | Ht 71.0 in | Wt 274.0 lb

## 2016-02-26 DIAGNOSIS — Z813 Family history of other psychoactive substance abuse and dependence: Secondary | ICD-10-CM | POA: Diagnosis not present

## 2016-02-26 DIAGNOSIS — Z818 Family history of other mental and behavioral disorders: Secondary | ICD-10-CM | POA: Diagnosis not present

## 2016-02-26 DIAGNOSIS — Z79899 Other long term (current) drug therapy: Secondary | ICD-10-CM | POA: Diagnosis not present

## 2016-02-26 DIAGNOSIS — F4310 Post-traumatic stress disorder, unspecified: Secondary | ICD-10-CM | POA: Diagnosis not present

## 2016-02-26 DIAGNOSIS — F3113 Bipolar disorder, current episode manic without psychotic features, severe: Secondary | ICD-10-CM | POA: Diagnosis not present

## 2016-02-26 DIAGNOSIS — F1021 Alcohol dependence, in remission: Secondary | ICD-10-CM | POA: Diagnosis not present

## 2016-02-26 DIAGNOSIS — G894 Chronic pain syndrome: Secondary | ICD-10-CM

## 2016-02-26 DIAGNOSIS — Z79891 Long term (current) use of opiate analgesic: Secondary | ICD-10-CM | POA: Diagnosis not present

## 2016-02-26 DIAGNOSIS — Z811 Family history of alcohol abuse and dependence: Secondary | ICD-10-CM

## 2016-02-26 MED ORDER — LAMOTRIGINE 25 MG PO TABS: 50.0000 mg | ORAL_TABLET | Freq: Every day | ORAL | 2 refills | Status: DC
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
## 2016-03-06 ENCOUNTER — Encounter: Payer: Self-pay | Admitting: Family Medicine

## 2016-03-06 ENCOUNTER — Telehealth: Payer: Self-pay | Admitting: Family Medicine

## 2016-03-06 ENCOUNTER — Ambulatory Visit (INDEPENDENT_AMBULATORY_CARE_PROVIDER_SITE_OTHER): Payer: Medicaid Other | Admitting: Family Medicine

## 2016-03-06 VITALS — BP 105/69 | HR 98 | Temp 97.5°F | Ht 71.0 in | Wt 260.5 lb

## 2016-03-06 DIAGNOSIS — K6289 Other specified diseases of anus and rectum: Secondary | ICD-10-CM | POA: Diagnosis not present

## 2016-03-06 MED ORDER — OXYCODONE HCL 5 MG PO TABS: 5.0000 mg | ORAL_TABLET | Freq: Three times a day (TID) | ORAL | 0 refills | Status: DC | PRN

## 2016-03-06 NOTE — Telephone Encounter (Signed)
Per [place] at [facility] pt filled Oxycodone 5mg  # 49 on 02/27/2016 written by Dr Morton Stall  Pt paid cash Per Dr Warrick Parisian, okay to fill rx written today [facility] notified

## 2016-03-06 NOTE — Progress Notes (Signed)
   BP 105/69   Pulse 98   Temp 97.5 F (36.4 C) (Oral)   Ht 5\' 11"  (1.803 m)   Wt 260 lb 8 oz (118.2 kg)   BMI 36.33 kg/m    Subjective:    Patient ID: Oscar Hall, male    DOB: [birth year], 55 y.o.   MRN: [id number]  HPI: Oscar Hall is a 55 y.o. male presenting on 03/06/2016 for Wound Check (still having some bleeding) and Discuss pain medication (wants to cut back on his Oxycodone from 10 mg to 5 mg)   HPI Rectal pain and wound recheck Patient is coming in for recheck on his rectal pain and for rectal wound and he feels like it has much improved  And he feels like he just needs a small amount of pain medications to get him through the [place] weekend. they have continued the same wound care that they have recommended by the surgeon. They deny having any drainage out of it any further. The pain today is a 4 out of 10.  Relevant past medical, surgical, family and social history reviewed and updated as indicated. Interim medical history since our last visit reviewed. Allergies and medications reviewed and updated.  Review of Systems  Constitutional: Negative for chills and fever.  Respiratory: Negative for shortness of breath and wheezing.   Cardiovascular: Negative for chest pain and leg swelling.  Musculoskeletal: Negative for back pain and gait problem.  Skin: Positive for wound. Negative for color change and rash.  All other systems reviewed and are negative.   Per HPI unless specifically indicated above      Objective:    BP 105/69   Pulse 98   Temp 97.5 F (36.4 C) (Oral)   Ht 5\' 11"  (1.803 m)   Wt 260 lb 8 oz (118.2 kg)   BMI 36.33 kg/m   Wt Readings from Last 3 Encounters:  03/06/16 260 lb 8 oz (118.2 kg)  02/17/16 256 lb 4 oz (116.2 kg)  02/04/16 263 lb (119.3 kg)    Physical Exam  Constitutional: He is oriented to person, place, and time. He appears well-developed and well-nourished. No distress.  Eyes: Conjunctivae are normal.  Musculoskeletal: Normal  range of motion. He exhibits no edema.  Neurological: He is alert and oriented to person, place, and time. Coordination normal.  Skin: Skin is warm and dry. Lesion (Wound is about 2 cm wide by a 1 cm long and has no depth to it and is flush with the skin level now. Good pink granulation tissue at base. No signs of erythema or warmth or drainage.) noted. No rash noted. He is not diaphoretic.  Psychiatric: He has a normal mood and affect. His behavior is normal.  Nursing note and vitals reviewed.     Assessment & Plan:   Problem List Items Addressed This Visit    None    Visit Diagnoses    Anal or rectal pain    -  Primary   Relevant Medications   oxyCODONE (OXY IR/ROXICODONE) 5 MG immediate release tablet      Continue current treatment and will likely be healed by the next time he sees me.  Follow up plan: Return if symptoms worsen or fail to improve.  Counseling provided for all of the vaccine components No orders of the defined types were placed in this encounter.   [name], MD [facility] 03/06/2016, 2:46 PM

## 2016-03-27 ENCOUNTER — Ambulatory Visit (INDEPENDENT_AMBULATORY_CARE_PROVIDER_SITE_OTHER): Payer: Medicaid Other | Admitting: Family Medicine

## 2016-03-27 ENCOUNTER — Encounter: Payer: Self-pay | Admitting: Family Medicine

## 2016-03-27 VITALS — BP 125/82 | HR 99 | Temp 97.9°F | Ht 71.0 in | Wt 266.0 lb

## 2016-03-27 DIAGNOSIS — K2100 Gastro-esophageal reflux disease with esophagitis, without bleeding: Secondary | ICD-10-CM

## 2016-03-27 DIAGNOSIS — Z23 Encounter for immunization: Secondary | ICD-10-CM | POA: Diagnosis not present

## 2016-03-27 DIAGNOSIS — I10 Essential (primary) hypertension: Secondary | ICD-10-CM | POA: Diagnosis not present

## 2016-03-27 DIAGNOSIS — K6289 Other specified diseases of anus and rectum: Secondary | ICD-10-CM | POA: Diagnosis not present

## 2016-03-27 DIAGNOSIS — K219 Gastro-esophageal reflux disease without esophagitis: Secondary | ICD-10-CM | POA: Insufficient documentation

## 2016-03-27 MED ORDER — ONDANSETRON HCL 4 MG PO TABS: 4.0000 mg | ORAL_TABLET | Freq: Three times a day (TID) | ORAL | 0 refills | Status: DC | PRN

## 2016-03-27 MED ORDER — OXYCODONE HCL 5 MG PO TABS: 5.0000 mg | ORAL_TABLET | Freq: Three times a day (TID) | ORAL | 0 refills | Status: DC | PRN

## 2016-03-27 NOTE — Assessment & Plan Note (Signed)
Off of medications currently, mostly running well at home, will keep off of medications for now and monitor, return if elevated sooner than next visit.

## 2016-03-27 NOTE — Assessment & Plan Note (Signed)
On omeprazole 40 mg, recommended adding the Zantac, recommended dietary changes and lifestyle changes, recommended that he needs to go see GI and get an EGD

## 2016-03-27 NOTE — Progress Notes (Signed)
BP 125/82   Pulse 99   Temp 97.9 F (36.6 C) (Oral)   Ht 5\' 11"  (1.803 m)   Wt 266 lb (120.7 kg)   BMI 37.10 kg/m    Subjective:    Patient ID: Oscar Hall, male    DOB: [birth year], 56 y.o.   MRN: [id number]  HPI: Oscar Hall is a 56 y.o. male presenting on 03/27/2016 for Gastroesophageal Reflux (having episodes of heart burn, would like to increase or change Omeprazole); Blood pressure question (has not been taking BP medication because BP has been normal, wants to know your opinion); and Medication Refill (Oxycodone & Zofran)   HPI GERD Patient has been having issues with his GERD or acid reflux that has been increasing and he is having breakthrough a couple times a week despite being on his omeprazole 40 mg daily. He denies any blood in his stool but does have some nausea and vomiting associated with it. He also has a lot of belching and burping associated with it and heartburn associated with it. He denies any fevers or chills. He has never had an EGD. He says a lot of his issues are night but sometimes he does get it during the day as well. He does admit that he eats dinner late right before bedtime and has a lot of caffeine and tomato products in his diet.  Hypertension recheck Patient is coming in for hypertension recheck today. He is not currently on any medications because he stopped them and his blood pressures at home are mostly in the 1 teens over 70s but he does have occasional days where he consistently runs over 140/90 but the majority of the time is normal. We will continue to monitor this over the next few months and if elevated will go back on a blood pressure pill if needed. He denies any chest pain or blurred vision or headaches.  Anal/rectal pain from sore Patient's anal/rectal pain from his sore is much improved but still there as it is almost completely healed. It is very superficial at this time and more of a skin irritation. He has also been having frequent stools  and that is very irritating towards this site. He denies any drainage or fevers or chills. The pain is rated as a 7 out of 10.  Relevant past medical, surgical, family and social history reviewed and updated as indicated. Interim medical history since our last visit reviewed. Allergies and medications reviewed and updated.  Review of Systems  Constitutional: Negative for chills and fever.  Respiratory: Negative for shortness of breath and wheezing.   Cardiovascular: Negative for chest pain and leg swelling.  Gastrointestinal: Positive for abdominal pain, nausea and vomiting. Negative for constipation.  Musculoskeletal: Negative for back pain and gait problem.  Skin: Positive for wound. Negative for color change and rash.  All other systems reviewed and are negative.   Per HPI unless specifically indicated above      Objective:    BP 125/82   Pulse 99   Temp 97.9 F (36.6 C) (Oral)   Ht 5\' 11"  (1.803 m)   Wt 266 lb (120.7 kg)   BMI 37.10 kg/m   Wt Readings from Last 3 Encounters:  03/27/16 266 lb (120.7 kg)  03/06/16 260 lb 8 oz (118.2 kg)  02/17/16 256 lb 4 oz (116.2 kg)    Physical Exam  Constitutional: He is oriented to person, place, and time. He appears well-developed and well-nourished. No distress.  Eyes: Conjunctivae are normal.  Right eye exhibits no discharge. Left eye exhibits no discharge. No scleral icterus.  Cardiovascular: Normal rate, regular rhythm, normal heart sounds and intact distal pulses.   No murmur heard. Pulmonary/Chest: Effort normal and breath sounds normal. No respiratory distress. He has no wheezes.  Abdominal: Soft. Bowel sounds are normal. He exhibits no distension. There is tenderness (Mild epigastric). There is no rebound and no guarding.  Genitourinary:  Genitourinary Comments: Rectal sore has a small amount of irritation in the center but otherwise is almost completely healed  Musculoskeletal: Normal range of motion. He exhibits no edema.    Neurological: He is alert and oriented to person, place, and time. Coordination normal.  Skin: Skin is warm and dry. No rash noted. He is not diaphoretic.  Psychiatric: He has a normal mood and affect. His behavior is normal.  Nursing note and vitals reviewed.     Assessment & Plan:   Problem List Items Addressed This Visit      Cardiovascular and Mediastinum   Essential hypertension - Primary    Off of medications currently, mostly running well at home, will keep off of medications for now and monitor, return if elevated sooner than next visit.        Digestive   GERD (gastroesophageal reflux disease)    On omeprazole 40 mg, recommended adding the Zantac, recommended dietary changes and lifestyle changes, recommended that he needs to go see GI and get an EGD      Relevant Medications   ondansetron (ZOFRAN) 4 MG tablet    Other Visit Diagnoses    Encounter for immunization       Relevant Orders   Flu Vaccine QUAD 36+ mos IM (Completed)   Anal or rectal pain       Relevant Medications   oxyCODONE (OXY IR/ROXICODONE) 5 MG immediate release tablet       Follow up plan: Return in about 3 months (around 06/25/2016), or if symptoms worsen or fail to improve, for Recheck breathing and hypertension.  Counseling provided for all of the vaccine components Orders Placed This Encounter  Procedures  . Flu Vaccine QUAD 36+ mos IM    [name], MD [facility] 03/27/2016, 9:12 AM

## 2016-05-19 ENCOUNTER — Ambulatory Visit (HOSPITAL_COMMUNITY): Payer: Self-pay | Admitting: Psychiatry

## 2016-06-04 ENCOUNTER — Ambulatory Visit (HOSPITAL_COMMUNITY): Payer: Self-pay | Admitting: Psychiatry

## 2016-06-17 ENCOUNTER — Encounter (HOSPITAL_COMMUNITY): Payer: Self-pay | Admitting: Psychiatry

## 2016-06-17 ENCOUNTER — Ambulatory Visit (INDEPENDENT_AMBULATORY_CARE_PROVIDER_SITE_OTHER): Payer: Medicaid Other | Admitting: Psychiatry

## 2016-06-17 VITALS — BP 124/80 | HR 100 | Resp 16 | Ht 71.0 in | Wt 265.0 lb

## 2016-06-17 DIAGNOSIS — Z818 Family history of other mental and behavioral disorders: Secondary | ICD-10-CM | POA: Diagnosis not present

## 2016-06-17 DIAGNOSIS — G894 Chronic pain syndrome: Secondary | ICD-10-CM

## 2016-06-17 DIAGNOSIS — Z79891 Long term (current) use of opiate analgesic: Secondary | ICD-10-CM | POA: Diagnosis not present

## 2016-06-17 DIAGNOSIS — Z811 Family history of alcohol abuse and dependence: Secondary | ICD-10-CM | POA: Diagnosis not present

## 2016-06-17 DIAGNOSIS — Z79899 Other long term (current) drug therapy: Secondary | ICD-10-CM

## 2016-06-17 DIAGNOSIS — F4310 Post-traumatic stress disorder, unspecified: Secondary | ICD-10-CM

## 2016-06-17 DIAGNOSIS — Z813 Family history of other psychoactive substance abuse and dependence: Secondary | ICD-10-CM

## 2016-06-17 DIAGNOSIS — F3113 Bipolar disorder, current episode manic without psychotic features, severe: Secondary | ICD-10-CM

## 2016-06-17 MED ORDER — LAMOTRIGINE 25 MG PO TABS: 50.0000 mg | ORAL_TABLET | Freq: Every day | ORAL | 1 refills | Status: DC

## 2016-06-17 MED ORDER — BUSPIRONE HCL 10 MG PO TABS: 10.0000 mg | ORAL_TABLET | Freq: Two times a day (BID) | ORAL | 1 refills | Status: DC
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
## 2016-07-09 ENCOUNTER — Telehealth: Payer: Self-pay | Admitting: Family Medicine

## 2016-07-09 ENCOUNTER — Ambulatory Visit (HOSPITAL_COMMUNITY)
Admission: RE | Admit: 2016-07-09 | Discharge: 2016-07-09 | Disposition: A | Discharge status: Home / Self Care | Payer: Medicaid Other | Source: Ambulatory Visit | Attending: Family Medicine | Admitting: Family Medicine

## 2016-07-09 ENCOUNTER — Ambulatory Visit (INDEPENDENT_AMBULATORY_CARE_PROVIDER_SITE_OTHER): Payer: Medicaid Other | Admitting: Family Medicine

## 2016-07-09 ENCOUNTER — Encounter: Payer: Self-pay | Admitting: Family Medicine

## 2016-07-09 VITALS — BP 131/84 | HR 76 | Temp 97.1°F | Ht 71.0 in | Wt 269.0 lb

## 2016-07-09 DIAGNOSIS — M79661 Pain in right lower leg: Secondary | ICD-10-CM

## 2016-07-09 DIAGNOSIS — I739 Peripheral vascular disease, unspecified: Secondary | ICD-10-CM | POA: Diagnosis not present

## 2016-07-09 MED ORDER — TIOTROPIUM BROMIDE MONOHYDRATE 18 MCG IN CAPS: 18.0000 ug | ORAL_CAPSULE | Freq: Every day | RESPIRATORY_TRACT | 1 refills | Status: DC

## 2016-07-09 MED ORDER — SIMVASTATIN 20 MG PO TABS: 20.0000 mg | ORAL_TABLET | Freq: Every day | ORAL | 1 refills | Status: DC

## 2016-07-09 MED ORDER — ONDANSETRON 4 MG PO TBDP: 4.0000 mg | ORAL_TABLET | Freq: Three times a day (TID) | ORAL | 1 refills | Status: DC | PRN

## 2016-07-09 NOTE — Progress Notes (Signed)
BP 131/84   Pulse 76   Temp 97.1 F (36.2 C) (Oral)   Ht 5\' 11"  (1.803 m)   Wt 269 lb (122 kg)   BMI 37.52 kg/m    Subjective:    Patient ID: Oscar Hall, male    DOB: [birth year], 56 y.o.   MRN: [id number]  HPI: Oscar Hall is a 56 y.o. male presenting on 07/09/2016 for Varicose veins in right leg (leg is painful, wants referral)   HPI Right lower leg pain Patient has been having right lower leg pain is been going on for the past few days. It hurts in the back of his calf and is worse with walking. He denies any fevers or chills or redness or warmth or swelling. He thought it might be due because of his varicose veins. He says the pain is worse with walking longer distances. He denies any numbness or weakness in the leg.   Relevant past medical, surgical, family and social history reviewed and updated as indicated. Interim medical history since our last visit reviewed. Allergies and medications reviewed and updated.  Review of Systems  Constitutional: Negative for chills and fever.  Respiratory: Negative for shortness of breath and wheezing.   Cardiovascular: Negative for chest pain and leg swelling.  Musculoskeletal: Positive for myalgias. Negative for back pain and gait problem.  Skin: Negative for rash.  All other systems reviewed and are negative.   Per HPI unless specifically indicated above        Objective:    BP 131/84   Pulse 76   Temp 97.1 F (36.2 C) (Oral)   Ht 5\' 11"  (1.803 m)   Wt 269 lb (122 kg)   BMI 37.52 kg/m   Wt Readings from Last 3 Encounters:  07/09/16 269 lb (122 kg)  03/27/16 266 lb (120.7 kg)  03/06/16 260 lb 8 oz (118.2 kg)    Physical Exam  Constitutional: He is oriented to person, place, and time. He appears well-developed and well-nourished. No distress.  Eyes: Conjunctivae are normal. No scleral icterus.  Cardiovascular: Normal rate, regular rhythm, normal heart sounds and intact distal pulses.   No murmur  heard. Pulmonary/Chest: Effort normal and breath sounds normal. No respiratory distress. He has no wheezes. He has no rales.  Musculoskeletal: Normal range of motion. He exhibits no edema.       Right lower leg: He exhibits tenderness (positive homans sign, right calf tenderness). He exhibits no bony tenderness and no swelling.  Neurological: He is alert and oriented to person, place, and time. Coordination normal.  Skin: Skin is warm and dry. No rash noted. He is not diaphoretic.  Psychiatric: He has a normal mood and affect. His behavior is normal.  Nursing note and vitals reviewed.       Assessment & Plan:   Problem List Items Addressed This Visit    None    Visit Diagnoses    Claudication [facility])    -  Primary   Relevant Orders   Ambulatory referral to Vascular Surgery   Right calf pain       Relevant Orders   US Venous Img Lower Unilateral Right (Completed)       Follow up plan: Return if symptoms worsen or fail to improve.  Counseling provided for all of the vaccine components Orders Placed This Encounter  Procedures  . US Venous Img Lower Unilateral Right    [name], MD [facility] 07/09/2016, 10:06 AM

## 2016-07-09 NOTE — Telephone Encounter (Signed)
Patient states that he is having bad pain in his leg and would like something sent to pharmacy to help with pain

## 2016-07-09 NOTE — Telephone Encounter (Signed)
Patient does not need to come back, ultrasound was normal, I did send a message saying that I would do the CTA but then looking at it it would probably be better if I referred him to vascular surgery and they couldn't perform the CTA with runoff. I put in the referral and let them know that it is pending and we will go from there.

## 2016-07-10 ENCOUNTER — Other Ambulatory Visit: Payer: Self-pay | Admitting: *Deleted

## 2016-07-10 MED ORDER — TIZANIDINE HCL 2 MG PO CAPS: 2.0000 mg | ORAL_CAPSULE | Freq: Three times a day (TID) | ORAL | 0 refills | Status: DC

## 2016-07-10 NOTE — Addendum Note (Signed)
Addended by: [name] on: 07/10/2016 10:36 AM   Modules accepted: Orders

## 2016-07-10 NOTE — Progress Notes (Signed)
Per [facility] insurance would not cover Tizanidine capsule Per Dr Dettinger ok to change to tablets Pharmacy notified

## 2016-07-10 NOTE — Telephone Encounter (Signed)
Left message for patient , provider sent in new script ,(zanaflex,muscle relaxer).   Check with the pharmacy.

## 2016-07-13 ENCOUNTER — Telehealth: Payer: Self-pay | Admitting: Family Medicine

## 2016-07-13 NOTE — Telephone Encounter (Signed)
Mr. Tramell called in upset.  Does not want muscle relaxers, has plenty of them.  Wants something called in for pain.

## 2016-07-13 NOTE — Telephone Encounter (Signed)
Talked with wife, she was very pleasant stating they are waiting on appt for CT. They would like to go to [facility] if not [facility]. Asked her if he could take 800 mg of Ibuprofen for his pain. He is unable to take ibuprofen, tylenol, ASA, and others d/t these bothering his stomach. Told her this is probably why Dr. Warrick Parisian prescribed the muscle relaxer since he is not able to take a lot of medications for pain. Will get back with them when CT is scheduled

## 2016-08-07 ENCOUNTER — Telehealth: Payer: Self-pay | Admitting: Family Medicine

## 2016-08-07 ENCOUNTER — Encounter: Payer: Self-pay | Admitting: Vascular Surgery

## 2016-08-07 MED ORDER — AZITHROMYCIN 250 MG PO TABS: ORAL_TABLET | ORAL | 0 refills | Status: DC

## 2016-08-07 NOTE — Telephone Encounter (Signed)
Patient aware that azithro has been sent to pharmacy

## 2016-08-07 NOTE — Telephone Encounter (Signed)
Patient's wife was recently diagnosed with Pertussis.  He would like for [place] to send in a prescription for an antibiotic for him.  Please advise.

## 2016-08-16 ENCOUNTER — Emergency Department (HOSPITAL_COMMUNITY)
Admission: EM | Admit: 2016-08-16 | Discharge: 2016-08-16 | Discharge status: Against Medical Advice | Payer: Medicaid Other | Attending: Emergency Medicine | Admitting: Emergency Medicine

## 2016-08-16 ENCOUNTER — Encounter (HOSPITAL_COMMUNITY): Payer: Self-pay

## 2016-08-16 ENCOUNTER — Emergency Department (HOSPITAL_COMMUNITY): Payer: Medicaid Other

## 2016-08-16 DIAGNOSIS — J449 Chronic obstructive pulmonary disease, unspecified: Secondary | ICD-10-CM | POA: Diagnosis not present

## 2016-08-16 DIAGNOSIS — M545 Low back pain: Secondary | ICD-10-CM | POA: Insufficient documentation

## 2016-08-16 DIAGNOSIS — Z79899 Other long term (current) drug therapy: Secondary | ICD-10-CM | POA: Diagnosis not present

## 2016-08-16 DIAGNOSIS — R112 Nausea with vomiting, unspecified: Secondary | ICD-10-CM | POA: Insufficient documentation

## 2016-08-16 DIAGNOSIS — F17210 Nicotine dependence, cigarettes, uncomplicated: Secondary | ICD-10-CM | POA: Diagnosis not present

## 2016-08-16 DIAGNOSIS — I10 Essential (primary) hypertension: Secondary | ICD-10-CM | POA: Diagnosis not present

## 2016-08-16 DIAGNOSIS — F1290 Cannabis use, unspecified, uncomplicated: Secondary | ICD-10-CM | POA: Diagnosis not present

## 2016-08-16 DIAGNOSIS — I2510 Atherosclerotic heart disease of native coronary artery without angina pectoris: Secondary | ICD-10-CM | POA: Diagnosis not present

## 2016-08-16 LAB — URINALYSIS, ROUTINE W REFLEX MICROSCOPIC
BILIRUBIN URINE: NEGATIVE
Glucose, UA: NEGATIVE mg/dL
Ketones, ur: NEGATIVE mg/dL
LEUKOCYTES UA: NEGATIVE
NITRITE: NEGATIVE
PROTEIN: NEGATIVE mg/dL
Specific Gravity, Urine: 1.017 (ref 1.005–1.030)
Squamous Epithelial / LPF: NONE SEEN
pH: 5 (ref 5.0–8.0)

## 2016-08-16 LAB — COMPREHENSIVE METABOLIC PANEL
ALT: 22 U/L (ref 17–63)
ANION GAP: 13 (ref 5–15)
AST: 26 U/L (ref 15–41)
Albumin: 4.5 g/dL (ref 3.5–5.0)
Alkaline Phosphatase: 95 U/L (ref 38–126)
BILIRUBIN TOTAL: 0.7 mg/dL (ref 0.3–1.2)
BUN: 14 mg/dL (ref 6–20)
CO2: 22 mmol/L (ref 22–32)
Calcium: 9.5 mg/dL (ref 8.9–10.3)
Chloride: 98 mmol/L — ABNORMAL LOW (ref 101–111)
Creatinine, Ser: 1.08 mg/dL (ref 0.61–1.24)
GFR calc Af Amer: >60 mL/min (ref >60)
Glucose, Bld: 124 mg/dL — ABNORMAL HIGH (ref 65–99)
POTASSIUM: 4 mmol/L (ref 3.5–5.1)
Sodium: 133 mmol/L — ABNORMAL LOW (ref 135–145)
TOTAL PROTEIN: 8.7 g/dL — AB (ref 6.5–8.1)

## 2016-08-16 LAB — CBC
HEMATOCRIT: 48.5 % (ref 39.0–52.0)
HEMOGLOBIN: 17.5 g/dL — AB (ref 13.0–17.0)
MCH: 33.7 pg (ref 26.0–34.0)
MCHC: 36.1 g/dL — ABNORMAL HIGH (ref 30.0–36.0)
MCV: 93.3 fL (ref 78.0–100.0)
Platelets: 265 10*3/uL (ref 150–400)
RBC: 5.2 MIL/uL (ref 4.22–5.81)
RDW: 13 % (ref 11.5–15.5)
WBC: 15.8 10*3/uL — AB (ref 4.0–10.5)

## 2016-08-16 LAB — LIPASE, BLOOD: Lipase: 21 U/L (ref 11–51)

## 2016-08-16 MED ORDER — SODIUM CHLORIDE 0.9 % IV BOLUS (SEPSIS)
1000.0000 mL | Freq: Once | INTRAVENOUS | Status: AC
  Administered 2016-08-16: 1000 mL via INTRAVENOUS

## 2016-08-16 MED ORDER — FAMOTIDINE IN NACL 20-0.9 MG/50ML-% IV SOLN
20.0000 mg | Freq: Once | INTRAVENOUS | Status: AC
  Administered 2016-08-16: 20 mg via INTRAVENOUS
  Filled 2016-08-16: qty 50

## 2016-08-16 MED ORDER — ONDANSETRON HCL 4 MG/2ML IJ SOLN
4.0000 mg | INTRAMUSCULAR | Status: DC | PRN
  Administered 2016-08-16: 4 mg via INTRAVENOUS
  Filled 2016-08-16: qty 2

## 2016-08-16 NOTE — ED Notes (Signed)
Patient called for writer to come into room. Writer went into room and patient yelled at nurse "Take this G** Damn shit off me, Im fucking leaving." Writer offered to get MD to speak with patient, patient continued to yell at Probation officer. Patient stated he wanted something for pain. Patient told Probation officer either "you take this damn IV out or Im taking it out and you can clean up the shit". Writer took out IV. Patient ambulatory out of department with visitor. Steady gait noted. NAd noted. MD made aware. [employer] present when patient left room and walked patient out of department.
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
## 2016-08-16 NOTE — ED Notes (Signed)
Per Lt. Arlyn Leak, while patient was leaving patient was walking down hallway to leave and slinging his left arm splattering blood down the hallway.

## 2016-08-16 NOTE — ED Notes (Signed)
Pt stated he needed to use the bathroom, but was having severe back pain. Offered to let him use a bed pan but pt. Refused. When trying to help pt get up to walk to bathroom he yelled, "Don't grab me! I have really bad PTSD." Apologized to pt. He insisted on walking to the bathroom even though

## 2016-08-16 NOTE — ED Notes (Signed)
Patient given water to drink.  

## 2016-08-16 NOTE — ED Triage Notes (Addendum)
Reports of vomiting that started this morning. States he ate BBQ last night and sister is also sick after eating food. Also reports of left side back pain, states he thinks he pulled something while vomiting.

## 2016-08-18 ENCOUNTER — Encounter: Payer: Self-pay | Admitting: Vascular Surgery

## 2016-08-18 ENCOUNTER — Ambulatory Visit (INDEPENDENT_AMBULATORY_CARE_PROVIDER_SITE_OTHER): Payer: Medicaid Other | Admitting: Vascular Surgery

## 2016-08-18 VITALS — BP 125/88 | HR 117 | Temp 97.0°F | Resp 18 | Ht 71.5 in | Wt 261.5 lb

## 2016-08-18 DIAGNOSIS — M79604 Pain in right leg: Secondary | ICD-10-CM

## 2016-08-18 NOTE — Addendum Note (Signed)
Addended by: Lianne Cure A on: 08/18/2016 11:59 AM   Modules accepted: Orders
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
## 2016-08-19 ENCOUNTER — Ambulatory Visit (HOSPITAL_COMMUNITY)
Admission: RE | Admit: 2016-08-19 | Discharge: 2016-08-19 | Disposition: A | Discharge status: Home / Self Care | Payer: Medicaid Other | Source: Ambulatory Visit | Attending: Vascular Surgery | Admitting: Vascular Surgery

## 2016-08-19 ENCOUNTER — Ambulatory Visit (INDEPENDENT_AMBULATORY_CARE_PROVIDER_SITE_OTHER)
Admission: RE | Admit: 2016-08-19 | Discharge: 2016-08-19 | Disposition: A | Discharge status: Home / Self Care | Payer: Medicaid Other | Source: Ambulatory Visit | Attending: Vascular Surgery | Admitting: Vascular Surgery

## 2016-08-19 ENCOUNTER — Other Ambulatory Visit: Payer: Self-pay

## 2016-08-19 ENCOUNTER — Encounter: Payer: Self-pay | Admitting: Vascular Surgery

## 2016-08-19 ENCOUNTER — Ambulatory Visit (INDEPENDENT_AMBULATORY_CARE_PROVIDER_SITE_OTHER): Payer: Medicaid Other | Admitting: Vascular Surgery

## 2016-08-19 VITALS — BP 124/84 | HR 107 | Temp 98.5°F | Resp 20 | Ht 71.5 in | Wt 258.3 lb

## 2016-08-19 DIAGNOSIS — I739 Peripheral vascular disease, unspecified: Secondary | ICD-10-CM

## 2016-08-19 DIAGNOSIS — I70201 Unspecified atherosclerosis of native arteries of extremities, right leg: Secondary | ICD-10-CM | POA: Diagnosis not present

## 2016-08-19 DIAGNOSIS — M79604 Pain in right leg: Secondary | ICD-10-CM | POA: Diagnosis present

## 2016-08-19 DIAGNOSIS — I70229 Atherosclerosis of native arteries of extremities with rest pain, unspecified extremity: Secondary | ICD-10-CM

## 2016-08-19 LAB — VAS US LOWER EXTREMITY ARTERIAL DUPLEX
RATIBDISTSYS: 16 cm/s
RPOPDPSV: -23 cm/s
RPOPPPSV: 23 cm/s
RSFPPSV: -35 cm/s
RTIBDISTSYS: 14 cm/s
Right peroneal sys PSV: -10 cm/s
Right super femoral dist sys PSV: -32 cm/s
Right super femoral mid sys PSV: -33 cm/s

## 2016-08-19 MED ORDER — OXYCODONE HCL 5 MG PO TABS: 5.0000 mg | ORAL_TABLET | Freq: Four times a day (QID) | ORAL | 0 refills | Status: DC | PRN
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
## 2016-08-21 ENCOUNTER — Ambulatory Visit (HOSPITAL_COMMUNITY)
Admission: RE | Admit: 2016-08-21 | Discharge: 2016-08-21 | Disposition: A | Discharge status: Home / Self Care | Payer: Medicaid Other | Source: Ambulatory Visit | Attending: Vascular Surgery | Admitting: Vascular Surgery

## 2016-08-21 ENCOUNTER — Encounter (HOSPITAL_COMMUNITY)
Admission: RE | Disposition: A | Discharge status: Home / Self Care | Payer: Self-pay | Source: Ambulatory Visit | Attending: Vascular Surgery

## 2016-08-21 DIAGNOSIS — Z8041 Family history of malignant neoplasm of ovary: Secondary | ICD-10-CM | POA: Insufficient documentation

## 2016-08-21 DIAGNOSIS — B1920 Unspecified viral hepatitis C without hepatic coma: Secondary | ICD-10-CM | POA: Diagnosis not present

## 2016-08-21 DIAGNOSIS — F419 Anxiety disorder, unspecified: Secondary | ICD-10-CM | POA: Diagnosis not present

## 2016-08-21 DIAGNOSIS — R296 Repeated falls: Secondary | ICD-10-CM | POA: Insufficient documentation

## 2016-08-21 DIAGNOSIS — Z8249 Family history of ischemic heart disease and other diseases of the circulatory system: Secondary | ICD-10-CM | POA: Insufficient documentation

## 2016-08-21 DIAGNOSIS — Z888 Allergy status to other drugs, medicaments and biological substances status: Secondary | ICD-10-CM | POA: Insufficient documentation

## 2016-08-21 DIAGNOSIS — Z885 Allergy status to narcotic agent status: Secondary | ICD-10-CM | POA: Insufficient documentation

## 2016-08-21 DIAGNOSIS — Z811 Family history of alcohol abuse and dependence: Secondary | ICD-10-CM | POA: Diagnosis not present

## 2016-08-21 DIAGNOSIS — F17210 Nicotine dependence, cigarettes, uncomplicated: Secondary | ICD-10-CM | POA: Insufficient documentation

## 2016-08-21 DIAGNOSIS — Z886 Allergy status to analgesic agent status: Secondary | ICD-10-CM | POA: Diagnosis not present

## 2016-08-21 DIAGNOSIS — J449 Chronic obstructive pulmonary disease, unspecified: Secondary | ICD-10-CM | POA: Insufficient documentation

## 2016-08-21 DIAGNOSIS — Z880 Allergy status to penicillin: Secondary | ICD-10-CM | POA: Diagnosis not present

## 2016-08-21 DIAGNOSIS — K219 Gastro-esophageal reflux disease without esophagitis: Secondary | ICD-10-CM | POA: Insufficient documentation

## 2016-08-21 DIAGNOSIS — I70221 Atherosclerosis of native arteries of extremities with rest pain, right leg: Secondary | ICD-10-CM | POA: Insufficient documentation

## 2016-08-21 DIAGNOSIS — M1990 Unspecified osteoarthritis, unspecified site: Secondary | ICD-10-CM | POA: Insufficient documentation

## 2016-08-21 DIAGNOSIS — G894 Chronic pain syndrome: Secondary | ICD-10-CM | POA: Diagnosis not present

## 2016-08-21 DIAGNOSIS — Z882 Allergy status to sulfonamides status: Secondary | ICD-10-CM | POA: Diagnosis not present

## 2016-08-21 DIAGNOSIS — Z79899 Other long term (current) drug therapy: Secondary | ICD-10-CM | POA: Insufficient documentation

## 2016-08-21 DIAGNOSIS — Z87442 Personal history of urinary calculi: Secondary | ICD-10-CM | POA: Diagnosis not present

## 2016-08-21 DIAGNOSIS — Z818 Family history of other mental and behavioral disorders: Secondary | ICD-10-CM | POA: Insufficient documentation

## 2016-08-21 DIAGNOSIS — E7800 Pure hypercholesterolemia, unspecified: Secondary | ICD-10-CM | POA: Insufficient documentation

## 2016-08-21 DIAGNOSIS — I10 Essential (primary) hypertension: Secondary | ICD-10-CM | POA: Diagnosis not present

## 2016-08-21 DIAGNOSIS — I2510 Atherosclerotic heart disease of native coronary artery without angina pectoris: Secondary | ICD-10-CM | POA: Diagnosis not present

## 2016-08-21 DIAGNOSIS — F319 Bipolar disorder, unspecified: Secondary | ICD-10-CM | POA: Diagnosis not present

## 2016-08-21 DIAGNOSIS — F4310 Post-traumatic stress disorder, unspecified: Secondary | ICD-10-CM | POA: Diagnosis not present

## 2016-08-21 HISTORY — PX: ABDOMINAL AORTOGRAM W/LOWER EXTREMITY: CATH118223

## 2016-08-21 SURGERY — ABDOMINAL AORTOGRAM W/LOWER EXTREMITY
Anesthesia: LOCAL

## 2016-08-21 MED ORDER — ACETAMINOPHEN 325 MG PO TABS: 325.0000 mg | ORAL_TABLET | ORAL | Status: DC | PRN

## 2016-08-21 MED ORDER — HEPARIN (PORCINE) IN NACL 2-0.9 UNIT/ML-% IJ SOLN
INTRAMUSCULAR | Status: AC
  Filled 2016-08-21: qty 1000

## 2016-08-21 MED ORDER — HEPARIN (PORCINE) IN NACL 2-0.9 UNIT/ML-% IJ SOLN
INTRAMUSCULAR | Status: AC | PRN
  Administered 2016-08-21: 1000 mL

## 2016-08-21 MED ORDER — LIDOCAINE HCL 1 % IJ SOLN
INTRAMUSCULAR | Status: AC
  Filled 2016-08-21: qty 20

## 2016-08-21 MED ORDER — LABETALOL HCL 5 MG/ML IV SOLN: 10.0000 mg | INTRAVENOUS | Status: DC | PRN

## 2016-08-21 MED ORDER — MORPHINE SULFATE (PF) 10 MG/ML IV SOLN: 2.0000 mg | INTRAVENOUS | Status: DC | PRN

## 2016-08-21 MED ORDER — PHENOL 1.4 % MT LIQD
1.0000 | OROMUCOSAL | Status: DC | PRN
  Filled 2016-08-21: qty 177

## 2016-08-21 MED ORDER — IODIXANOL 320 MG/ML IV SOLN
INTRAVENOUS | Status: DC | PRN
Start: 2016-08-21 — End: 2016-08-21
  Administered 2016-08-21: 140 mL via INTRAVENOUS

## 2016-08-21 MED ORDER — ALUM & MAG HYDROXIDE-SIMETH 200-200-20 MG/5ML PO SUSP
15.0000 mL | ORAL | Status: DC | PRN
  Filled 2016-08-21: qty 30

## 2016-08-21 MED ORDER — OXYCODONE HCL 5 MG PO TABS
5.0000 mg | ORAL_TABLET | ORAL | Status: DC | PRN
Start: 2016-08-21 — End: 2016-08-21

## 2016-08-21 MED ORDER — SODIUM CHLORIDE 0.9 % IV SOLN
INTRAVENOUS | Status: DC
  Administered 2016-08-21: 09:00:00 via INTRAVENOUS

## 2016-08-21 MED ORDER — DOCUSATE SODIUM 100 MG PO CAPS: 100.0000 mg | ORAL_CAPSULE | Freq: Every day | ORAL | Status: DC

## 2016-08-21 MED ORDER — HYDRALAZINE HCL 20 MG/ML IJ SOLN: 5.0000 mg | INTRAMUSCULAR | Status: DC | PRN

## 2016-08-21 MED ORDER — LIDOCAINE HCL (PF) 1 % IJ SOLN
INTRAMUSCULAR | Status: DC | PRN
Start: 2016-08-21 — End: 2016-08-21
  Administered 2016-08-21 (×2): 10 mL

## 2016-08-21 MED ORDER — SODIUM CHLORIDE 0.45 % IV SOLN
INTRAVENOUS | Status: DC
  Administered 2016-08-21: 100 mL/h via INTRAVENOUS

## 2016-08-21 MED ORDER — METOPROLOL TARTRATE 5 MG/5ML IV SOLN: 2.0000 mg | INTRAVENOUS | Status: DC | PRN

## 2016-08-21 MED ORDER — ONDANSETRON HCL 4 MG/2ML IJ SOLN: 4.0000 mg | Freq: Four times a day (QID) | INTRAMUSCULAR | Status: DC | PRN

## 2016-08-21 MED ORDER — GUAIFENESIN-DM 100-10 MG/5ML PO SYRP
15.0000 mL | ORAL_SOLUTION | ORAL | Status: DC | PRN
  Filled 2016-08-21: qty 15

## 2016-08-21 MED ORDER — ACETAMINOPHEN 325 MG RE SUPP: 325.0000 mg | RECTAL | Status: DC | PRN

## 2016-08-21 SURGICAL SUPPLY — 10 items
CATH ANGIO 5F PIGTAIL 65CM (CATHETERS) ×1 IMPLANT
COVER PRB 48X5XTLSCP FOLD TPE (BAG) ×1 IMPLANT
COVER PROBE 5X48 (BAG) ×2
KIT MICROINTRODUCER STIFF 5F (SHEATH) ×1 IMPLANT
KIT PV (KITS) ×2 IMPLANT
SHEATH PINNACLE 5F 10CM (SHEATH) ×1 IMPLANT
SYR MEDRAD MARK V 150ML (SYRINGE) ×2 IMPLANT
TRANSDUCER W/STOPCOCK (MISCELLANEOUS) ×2 IMPLANT
TRAY PV CATH (CUSTOM PROCEDURE TRAY) ×2 IMPLANT
WIRE HITORQ VERSACORE ST 145CM (WIRE) ×1 IMPLANT

## 2016-08-21 NOTE — Discharge Instructions (Signed)

## 2016-08-21 NOTE — Op Note (Addendum)
Procedure: Abdominal aortogram with bilateral lower extremity runoff  Preoperative diagnosis: Claudication  Postoperative diagnosis: Same  Anesthesia: Local  Operative findings: #1 right common femoral artery occlusion  Operative details: After obtaining informed consent, the patient was taken to the LAD. The patient was placed in supine position the Angio table. Both groins were prepped and draped in usual sterile fashion. Local anesthesia was infiltrated over the right common femoral artery. Ultrasound was used to identify the right common femoral artery. There was a large amount of posterior plaque but it did seem to have a small lumen anteriorly. Vocal anesthesia was infiltrated over this. I made several attempts to cannulate the common femoral artery using a standard introducer needle as well as a micropuncture needle but these were all unsuccessful. At this point attention was turned the left groin. Local anesthesia was inflated over the left common femoral artery. An introducer needle was used to cannula the left common femoral artery and an 035 versacore wire threaded up into the pelvis. Upon inspection of this under fluoroscopy the guidewire had gone up a circumflex iliac branch. Therefore a micropuncture sheath was placed over the guidewire and the guidewire and micropuncture sheath gently and slowly pulled back until the sheath and wire disengaged from the branch. I was then able to redirect an 035 J-wire up into the left external iliac. A micropuncture sheath was then advanced over this and the left external iliac artery. An 035 versacore wire was then advanced up in the abdominal aorta. A 5 French sheath was then placed over the guidewire into the left common femoral artery. This was thoroughly flushed with heparinized saline. A 5 French pig catheter was advanced over the guidewire into the abdominal aorta and abdominal aortogram was obtained in AP projection. The left and right renal arteries  are patent. Infrarenal abdominal aorta is patent. The left and right common external and internal iliac arteries are widely patent.  Next bilateral oblique views of the pelvis were obtained in a 30 RAO and LAO projection. This confirmed the above findings. This point the pigtail catheter was pulled down just above the aortic bifurcation and lower extremity runoff views were obtained through the pig catheter.  In the right lower extremity, the right common femoral artery is occluded essentially from the inguinal ligament down into the origins of the profunda and superficial femoral arteries. These do fill slowly distally. The remainder of the right lower extremity arterial tree shows a patent distal profunda and distal right superficial femoral artery. The popliteal artery is patent. There is two-vessel runoff to the right foot via the anterior and posterior tibial arteries. The peroneal artery is absent.  In the left lower extremity, the left common femoral profunda femoris and superficial femoral arteries are patent. There is some irregularity in the midportion of the left superficial femoral artery making about a 25-30% stenosis. The left popliteal artery is patent. There is also two-vessel runoff to the left foot via the anterior posterior tibial arteries. The peroneal artery was not visualized.  At this point the pigtail catheter was removed over a guidewire. The 5 French sheath was left in place to be pulled in the holding area. The patient tolerated the procedure well and there were complications. The patient was taken to the holding area in stable condition.  Operative management: These x-rays will be discussed with Dr. Donnetta Hutching who will decide on timing and management most likely with a right common femoral endarterectomy plus minus profundoplasty.  Ruta Hinds, MD Vascular  and Vein Specialists of [place] Office: [phone] Pager: [phone]
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
## 2016-08-21 NOTE — Interval H&P Note (Signed)
History and Physical Interval Note:  08/21/2016 9:42 AM  Oscar Hall  has presented today for surgery, with the diagnosis of pvd with RLE rest pain  The various methods of treatment have been discussed with the patient and family. After consideration of risks, benefits and other options for treatment, the patient has consented to  Procedure(s): Abdominal Aortogram w/Lower Extremity (N/A) as a surgical intervention .  The patient's history has been reviewed, patient examined, no change in status, stable for surgery.  I have reviewed the patient's chart and labs.  Questions were answered to the patient's satisfaction.     Ruta Hinds

## 2016-08-21 NOTE — Progress Notes (Signed)
15fr sheath removed from left femoral artery without difficulty. Manual pressure held for 15 minutes. Dressing applied. Clean, dry and intact. Left DP doppler. Post instructions given.

## 2016-08-24 ENCOUNTER — Encounter (HOSPITAL_COMMUNITY): Payer: Self-pay | Admitting: Vascular Surgery

## 2016-08-27 ENCOUNTER — Telehealth: Payer: Self-pay | Admitting: Family Medicine

## 2016-08-27 NOTE — Telephone Encounter (Signed)
appt scheduled Pt notified 

## 2016-08-31 ENCOUNTER — Ambulatory Visit (INDEPENDENT_AMBULATORY_CARE_PROVIDER_SITE_OTHER): Payer: Medicaid Other | Admitting: Family Medicine

## 2016-08-31 ENCOUNTER — Telehealth: Payer: Self-pay | Admitting: Vascular Surgery

## 2016-08-31 ENCOUNTER — Encounter: Payer: Self-pay | Admitting: Family Medicine

## 2016-08-31 ENCOUNTER — Other Ambulatory Visit: Payer: Self-pay

## 2016-08-31 VITALS — BP 138/81 | HR 80 | Temp 97.0°F | Ht 71.5 in | Wt 269.0 lb

## 2016-08-31 DIAGNOSIS — I70209 Unspecified atherosclerosis of native arteries of extremities, unspecified extremity: Secondary | ICD-10-CM

## 2016-08-31 MED ORDER — LIDOCAINE 5 % EX PTCH: 1.0000 | MEDICATED_PATCH | CUTANEOUS | 0 refills | Status: DC

## 2016-08-31 NOTE — Telephone Encounter (Signed)
Sched appt 09/01/16 at 3:45. Lm on cell# for pt to confirm appt.

## 2016-08-31 NOTE — Progress Notes (Signed)
   BP 138/81   Pulse 80   Temp 97 F (36.1 C) (Oral)   Ht 5' 11.5" (1.816 m)   Wt 269 lb (122 kg)   BMI 36.99 kg/m    Subjective:    Patient ID: Oscar Hall, male    DOB: 12-01-1960, 56 y.o.   MRN: [id number]  HPI: Oscar Hall is a 56 y.o. male presenting on 08/31/2016 for Groin Pain (blockage in arteries in groin, surgery schedule for 09/09/16)   HPI Right groin pain  Patient comes in today complaining of right groin pain that has been bothering him for some time and he has a surgery scheduled for an arterial blockage in the common femoral artery. He was given some pain meds by his surgeon but then wanted more and so is coming here today asking for more. He says that his pain is 10 out of 10 as in that right groin pain does not radiate anywhere else. He says oxycodone is really the only thing that works for him. He denies any fevers or chills or pain radiating anywhere else.  Relevant past medical, surgical, family and social history reviewed and updated as indicated. Interim medical history since our last visit reviewed. Allergies and medications reviewed and updated.  Review of Systems  Constitutional: Negative for chills and fever.  Respiratory: Negative for shortness of breath and wheezing.   Cardiovascular: Negative for chest pain and leg swelling.  Gastrointestinal: Negative for abdominal pain, constipation, diarrhea, nausea and vomiting.  Musculoskeletal: Negative for back pain and gait problem.  Skin: Negative for rash.  All other systems reviewed and are negative.   Per HPI unless specifically indicated above        Objective:    BP 138/81   Pulse 80   Temp 97 F (36.1 C) (Oral)   Ht 5' 11.5" (1.816 m)   Wt 269 lb (122 kg)   BMI 36.99 kg/m   Wt Readings from Last 3 Encounters:  08/31/16 269 lb (122 kg)  08/21/16 267 lb (121.1 kg)  08/19/16 258 lb 4.8 oz (117.2 kg)    Physical Exam  Constitutional: He is oriented to person, place, and time. He appears  well-developed and well-nourished. No distress.  Eyes: Conjunctivae are normal. No scleral icterus.  Cardiovascular: Normal rate, regular rhythm, normal heart sounds and intact distal pulses.   No murmur heard. Pulmonary/Chest: Effort normal and breath sounds normal. No respiratory distress. He has no wheezes. He has no rales.  Musculoskeletal: Normal range of motion. He exhibits no edema.       Legs: Neurological: He is alert and oriented to person, place, and time. Coordination normal.  Skin: Skin is warm and dry. No rash noted. He is not diaphoretic.  Psychiatric: He has a normal mood and affect. His behavior is normal.  Nursing note and vitals reviewed.       Assessment & Plan:   Problem List Items Addressed This Visit    None    Visit Diagnoses    Occlusion of common femoral artery (HCC)    -  Primary   Relevant Medications   lidocaine (LIDODERM) 5 %       Follow up plan: Return if symptoms worsen or fail to improve.  Counseling provided for all of the vaccine components No orders of the defined types were placed in this encounter.   Oscar Pina, MD [facility] 08/31/2016, 11:10 AM

## 2016-08-31 NOTE — Telephone Encounter (Signed)
-----   Message from Denman George, RN sent at 08/31/2016  9:33 AM EDT ----- Regarding: needs pre-op appt. with Dr. Markus Daft: [phone] This pt. needs an appt. with Dr. Donnetta Hutching to get questions answered prior to surgery on 09/09/16.  Can you schedule him to see Dr. Donnetta Hutching tomorrow.  I spoke with TFE, and he was agreeable to adding him on to his schedule.

## 2016-09-01 ENCOUNTER — Encounter: Payer: Self-pay | Admitting: Vascular Surgery

## 2016-09-01 ENCOUNTER — Ambulatory Visit (INDEPENDENT_AMBULATORY_CARE_PROVIDER_SITE_OTHER): Payer: Medicaid Other | Admitting: Vascular Surgery

## 2016-09-01 ENCOUNTER — Telehealth: Payer: Self-pay

## 2016-09-01 ENCOUNTER — Other Ambulatory Visit: Payer: Self-pay | Admitting: Physician Assistant

## 2016-09-01 VITALS — BP 126/79 | HR 106 | Temp 97.5°F | Resp 20 | Ht 71.5 in | Wt 271.0 lb

## 2016-09-01 DIAGNOSIS — I70229 Atherosclerosis of native arteries of extremities with rest pain, unspecified extremity: Secondary | ICD-10-CM

## 2016-09-01 DIAGNOSIS — I739 Peripheral vascular disease, unspecified: Secondary | ICD-10-CM

## 2016-09-01 MED ORDER — OXYCODONE HCL 5 MG PO TABS: 5.0000 mg | ORAL_TABLET | Freq: Four times a day (QID) | ORAL | 0 refills | Status: DC | PRN
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
## 2016-09-04 NOTE — Telephone Encounter (Signed)
Patient didn't want any of those anyways

## 2016-09-07 ENCOUNTER — Encounter (HOSPITAL_COMMUNITY): Payer: Self-pay

## 2016-09-07 ENCOUNTER — Encounter (HOSPITAL_COMMUNITY)
Admission: RE | Admit: 2016-09-07 | Discharge: 2016-09-07 | Disposition: A | Discharge status: Home / Self Care | Payer: Medicaid Other | Source: Ambulatory Visit | Attending: Vascular Surgery | Admitting: Vascular Surgery

## 2016-09-07 DIAGNOSIS — Z01818 Encounter for other preprocedural examination: Secondary | ICD-10-CM | POA: Diagnosis present

## 2016-09-07 DIAGNOSIS — I70229 Atherosclerosis of native arteries of extremities with rest pain, unspecified extremity: Secondary | ICD-10-CM | POA: Diagnosis not present

## 2016-09-07 HISTORY — DX: Dyspnea, unspecified: R06.00

## 2016-09-07 LAB — CBC
HEMATOCRIT: 47.8 % (ref 39.0–52.0)
HEMOGLOBIN: 16.4 g/dL (ref 13.0–17.0)
MCH: 32.7 pg (ref 26.0–34.0)
MCHC: 34.3 g/dL (ref 30.0–36.0)
MCV: 95.2 fL (ref 78.0–100.0)
Platelets: 259 10*3/uL (ref 150–400)
RBC: 5.02 MIL/uL (ref 4.22–5.81)
RDW: 13.3 % (ref 11.5–15.5)
WBC: 11 10*3/uL — AB (ref 4.0–10.5)

## 2016-09-07 LAB — URINALYSIS, ROUTINE W REFLEX MICROSCOPIC
BILIRUBIN URINE: NEGATIVE
Bacteria, UA: NONE SEEN
GLUCOSE, UA: NEGATIVE mg/dL
Ketones, ur: NEGATIVE mg/dL
LEUKOCYTES UA: NEGATIVE
NITRITE: NEGATIVE
PROTEIN: 30 mg/dL — AB
Specific Gravity, Urine: 1.016 (ref 1.005–1.030)
Squamous Epithelial / LPF: NONE SEEN
pH: 5 (ref 5.0–8.0)

## 2016-09-07 LAB — COMPREHENSIVE METABOLIC PANEL
ALBUMIN: 4 g/dL (ref 3.5–5.0)
ALK PHOS: 98 U/L (ref 38–126)
ALT: 27 U/L (ref 17–63)
AST: 26 U/L (ref 15–41)
Anion gap: 11 (ref 5–15)
BILIRUBIN TOTAL: 0.5 mg/dL (ref 0.3–1.2)
BUN: 11 mg/dL (ref 6–20)
CALCIUM: 9.1 mg/dL (ref 8.9–10.3)
CO2: 24 mmol/L (ref 22–32)
CREATININE: 1.22 mg/dL (ref 0.61–1.24)
Chloride: 99 mmol/L — ABNORMAL LOW (ref 101–111)
GFR calc Af Amer: >60 mL/min (ref >60)
GLUCOSE: 133 mg/dL — AB (ref 65–99)
Potassium: 3.8 mmol/L (ref 3.5–5.1)
Sodium: 134 mmol/L — ABNORMAL LOW (ref 135–145)
TOTAL PROTEIN: 7.5 g/dL (ref 6.5–8.1)

## 2016-09-07 LAB — SURGICAL PCR SCREEN
MRSA, PCR: NEGATIVE
Staphylococcus aureus: POSITIVE — AB

## 2016-09-07 LAB — APTT: APTT: 30 s (ref 24–36)

## 2016-09-07 LAB — TYPE AND SCREEN
ABO/RH(D): O POS
Antibody Screen: NEGATIVE

## 2016-09-07 LAB — PROTIME-INR
INR: 0.9
Prothrombin Time: 12.1 seconds (ref 11.4–15.2)

## 2016-09-07 NOTE — Pre-Procedure Instructions (Signed)
Colin Ellers  09/07/2016      [facility] [address] [address] [place] [address] Phone: [phone] Fax: [place], [address] [place] [address] Phone: [phone] Fax: [phone]  [id number] [place], [place] [address] Phone: [phone] Fax: [phone]    Your procedure is scheduled on 09-09-2016 Wednesday .  Report to [facility] Admitting at 9:00A.M.   Call this number if you have problems the morning of surgery:  [phone]   Remember:  Do not eat food or drink liquids after midnight.   Take these medicines the morning of surgery with A SIP OF WATER Albuterol inhaler if needed,buspirone(Buspar),lamotrlgine(Lamictal) Omeprazole(Prilosec),oxycodone if needed,Simvastatin(Zocor),Spiriva inhalation capsule,    STOP ASPIRIN,ANTIINFLAMATORIES (IBUPROFEN,ALEVE,MOTRIN,ADVIL,GOODY'S POWDERS),HERBAL SUPPLEMENTS,FISH OIL,AND VITAMINS 5-7 DAYS PRIOR TO SURGERY    Do not wear jewelry, make-up or nail polish.  Do not wear lotions, powders, or perfumes, or deoderant.  Do not shave 48 hours prior to surgery.  Men may shave face and neck.  Do not bring valuables to the hospital.  [facility] is not responsible for any belongings or valuables.  Contacts, dentures or bridgework may not be worn into surgery.  Leave your suitcase in the car.  After surgery it may be brought to your room.  For patients admitted to the hospital, discharge time will be determined by your treatment team.  Patients discharged the day of surgery will not be allowed to drive home.    Special Instructions: [facility] - Preparing for Surgery  Before surgery, you can play an important role.  Because skin is not sterile, your skin needs to be as free of germs as possible.  You can reduce the number  of germs on you skin by washing with CHG (chlorahexidine gluconate) soap before surgery.  CHG is an antiseptic cleaner which kills germs and bonds with the skin to continue killing germs even after washing.  Please DO NOT use if you have an allergy to CHG or antibacterial soaps.  If your skin becomes reddened/irritated stop using the CHG and inform your nurse when you arrive at Short Stay.  Do not shave (including legs and underarms) for at least 48 hours prior to the first CHG shower.  You may shave your face.  Please follow these instructions carefully:   1.  Shower with CHG Soap the night before surgery and the   morning of Surgery.  2.  If you choose to wash your hair, wash your hair first as usual with your normal shampoo.  3.  After you shampoo, rinse your hair and body thoroughly to remove the  Shampoo.  4.  Use CHG as you would any other liquid soap.  You can apply chg directly  to the skin and wash gently with scrungie or a clean washcloth.  5.  Apply the CHG Soap to your body ONLY FROM THE NECK DOWN.   Do not use on open wounds or open sores.  Avoid contact with your eyes,  ears, mouth and genitals (private parts).  Wash genitals (private parts) with your normal soap.  6.  Wash thoroughly, paying special attention to the area where your surgery will be performed.  7.  Thoroughly rinse your body with warm water from the neck down.  8.  DO NOT shower/wash with your normal  soap after using and rinsing o  the CHG Soap.  9.  Pat yourself dry with a clean towel.            10.  Wear clean pajamas.            11.  Place clean sheets on your bed the night of your first shower and do not sleep with pets.  Day of Surgery  Do not apply any lotions/deodorants the morning of surgery.  Please wear clean clothes to the hospital/surgery center.   Please read over the following fact sheets that you were given. MRSA Information and Surgical Site Infection Prevention

## 2016-09-08 MED ORDER — VANCOMYCIN HCL 10 G IV SOLR
1500.0000 mg | INTRAVENOUS | Status: AC
  Administered 2016-09-09: 1500 mg via INTRAVENOUS
  Filled 2016-09-08: qty 1500

## 2016-09-09 ENCOUNTER — Encounter (HOSPITAL_COMMUNITY): Payer: Self-pay | Admitting: Anesthesiology

## 2016-09-09 ENCOUNTER — Inpatient Hospital Stay (HOSPITAL_COMMUNITY): Payer: Medicaid Other | Admitting: Anesthesiology

## 2016-09-09 ENCOUNTER — Inpatient Hospital Stay (HOSPITAL_COMMUNITY)
Admission: RE | Admit: 2016-09-09 | Discharge: 2016-09-10 | DRG: 254 | Disposition: A | Discharge status: Home / Self Care | Payer: Medicaid Other | Source: Ambulatory Visit | Attending: Vascular Surgery | Admitting: Vascular Surgery

## 2016-09-09 ENCOUNTER — Encounter (HOSPITAL_COMMUNITY)
Admission: RE | Disposition: A | Discharge status: Home / Self Care | Payer: Self-pay | Source: Ambulatory Visit | Attending: Vascular Surgery

## 2016-09-09 DIAGNOSIS — Z882 Allergy status to sulfonamides status: Secondary | ICD-10-CM | POA: Diagnosis not present

## 2016-09-09 DIAGNOSIS — K219 Gastro-esophageal reflux disease without esophagitis: Secondary | ICD-10-CM | POA: Diagnosis present

## 2016-09-09 DIAGNOSIS — M549 Dorsalgia, unspecified: Secondary | ICD-10-CM | POA: Diagnosis present

## 2016-09-09 DIAGNOSIS — Z880 Allergy status to penicillin: Secondary | ICD-10-CM | POA: Diagnosis not present

## 2016-09-09 DIAGNOSIS — G8929 Other chronic pain: Secondary | ICD-10-CM | POA: Diagnosis present

## 2016-09-09 DIAGNOSIS — Z79899 Other long term (current) drug therapy: Secondary | ICD-10-CM | POA: Diagnosis not present

## 2016-09-09 DIAGNOSIS — M542 Cervicalgia: Secondary | ICD-10-CM | POA: Diagnosis present

## 2016-09-09 DIAGNOSIS — F17200 Nicotine dependence, unspecified, uncomplicated: Secondary | ICD-10-CM | POA: Diagnosis present

## 2016-09-09 DIAGNOSIS — Z886 Allergy status to analgesic agent status: Secondary | ICD-10-CM

## 2016-09-09 DIAGNOSIS — F319 Bipolar disorder, unspecified: Secondary | ICD-10-CM | POA: Diagnosis present

## 2016-09-09 DIAGNOSIS — F17210 Nicotine dependence, cigarettes, uncomplicated: Secondary | ICD-10-CM | POA: Diagnosis present

## 2016-09-09 DIAGNOSIS — R296 Repeated falls: Secondary | ICD-10-CM | POA: Diagnosis present

## 2016-09-09 DIAGNOSIS — I739 Peripheral vascular disease, unspecified: Secondary | ICD-10-CM | POA: Diagnosis present

## 2016-09-09 DIAGNOSIS — I708 Atherosclerosis of other arteries: Secondary | ICD-10-CM | POA: Diagnosis present

## 2016-09-09 DIAGNOSIS — I70201 Unspecified atherosclerosis of native arteries of extremities, right leg: Secondary | ICD-10-CM | POA: Diagnosis present

## 2016-09-09 DIAGNOSIS — J302 Other seasonal allergic rhinitis: Secondary | ICD-10-CM | POA: Diagnosis present

## 2016-09-09 DIAGNOSIS — I70221 Atherosclerosis of native arteries of extremities with rest pain, right leg: Secondary | ICD-10-CM | POA: Diagnosis not present

## 2016-09-09 DIAGNOSIS — Z8249 Family history of ischemic heart disease and other diseases of the circulatory system: Secondary | ICD-10-CM | POA: Diagnosis not present

## 2016-09-09 DIAGNOSIS — I2510 Atherosclerotic heart disease of native coronary artery without angina pectoris: Secondary | ICD-10-CM | POA: Diagnosis present

## 2016-09-09 DIAGNOSIS — J449 Chronic obstructive pulmonary disease, unspecified: Secondary | ICD-10-CM | POA: Diagnosis present

## 2016-09-09 DIAGNOSIS — I878 Other specified disorders of veins: Secondary | ICD-10-CM | POA: Diagnosis present

## 2016-09-09 DIAGNOSIS — Z888 Allergy status to other drugs, medicaments and biological substances status: Secondary | ICD-10-CM | POA: Diagnosis not present

## 2016-09-09 DIAGNOSIS — I10 Essential (primary) hypertension: Secondary | ICD-10-CM | POA: Diagnosis present

## 2016-09-09 HISTORY — PX: PATCH ANGIOPLASTY: SHX6230

## 2016-09-09 HISTORY — PX: ENDARTERECTOMY FEMORAL: SHX5804

## 2016-09-09 SURGERY — ENDARTERECTOMY, FEMORAL
Anesthesia: General | Site: Groin | Laterality: Right

## 2016-09-09 MED ORDER — MORPHINE SULFATE (PF) 2 MG/ML IV SOLN
2.0000 mg | INTRAVENOUS | Status: DC | PRN
  Administered 2016-09-09 – 2016-09-10 (×3): 2 mg via INTRAVENOUS
  Filled 2016-09-09 (×3): qty 1

## 2016-09-09 MED ORDER — ENOXAPARIN SODIUM 40 MG/0.4ML ~~LOC~~ SOLN: 40.0000 mg | SUBCUTANEOUS | Status: DC

## 2016-09-09 MED ORDER — ONDANSETRON HCL 4 MG/2ML IJ SOLN: 4.0000 mg | Freq: Four times a day (QID) | INTRAMUSCULAR | Status: DC | PRN

## 2016-09-09 MED ORDER — GUAIFENESIN-DM 100-10 MG/5ML PO SYRP: 15.0000 mL | ORAL_SOLUTION | ORAL | Status: DC | PRN

## 2016-09-09 MED ORDER — HYDRALAZINE HCL 20 MG/ML IJ SOLN: 5.0000 mg | INTRAMUSCULAR | Status: DC | PRN

## 2016-09-09 MED ORDER — SODIUM CHLORIDE 0.9 % IV SOLN: INTRAVENOUS | Status: DC

## 2016-09-09 MED ORDER — PROTAMINE SULFATE 10 MG/ML IV SOLN
INTRAVENOUS | Status: AC
  Filled 2016-09-09: qty 5

## 2016-09-09 MED ORDER — PANTOPRAZOLE SODIUM 40 MG PO TBEC: 40.0000 mg | DELAYED_RELEASE_TABLET | Freq: Every day | ORAL | Status: DC

## 2016-09-09 MED ORDER — HYDROCHLOROTHIAZIDE 25 MG PO TABS: 25.0000 mg | ORAL_TABLET | Freq: Every day | ORAL | Status: DC

## 2016-09-09 MED ORDER — HEPARIN SODIUM (PORCINE) 1000 UNIT/ML IJ SOLN
INTRAMUSCULAR | Status: DC | PRN
  Administered 2016-09-09: 10000 [IU] via INTRAVENOUS

## 2016-09-09 MED ORDER — HEPARIN SODIUM (PORCINE) 1000 UNIT/ML IJ SOLN
INTRAMUSCULAR | Status: AC
  Filled 2016-09-09: qty 1

## 2016-09-09 MED ORDER — HYDROMORPHONE HCL 1 MG/ML IJ SOLN
INTRAMUSCULAR | Status: AC
  Administered 2016-09-09: 0.5 mg via INTRAVENOUS
  Filled 2016-09-09: qty 1

## 2016-09-09 MED ORDER — TIOTROPIUM BROMIDE MONOHYDRATE 18 MCG IN CAPS
18.0000 ug | ORAL_CAPSULE | Freq: Every day | RESPIRATORY_TRACT | Status: DC
  Administered 2016-09-10: 18 ug via RESPIRATORY_TRACT
  Filled 2016-09-09: qty 5

## 2016-09-09 MED ORDER — ALBUTEROL SULFATE HFA 108 (90 BASE) MCG/ACT IN AERS
INHALATION_SPRAY | RESPIRATORY_TRACT | Status: DC | PRN
  Administered 2016-09-09: 2 via RESPIRATORY_TRACT

## 2016-09-09 MED ORDER — FENTANYL CITRATE (PF) 250 MCG/5ML IJ SOLN
INTRAMUSCULAR | Status: AC
  Filled 2016-09-09: qty 5

## 2016-09-09 MED ORDER — PROTAMINE SULFATE 10 MG/ML IV SOLN
INTRAVENOUS | Status: DC | PRN
  Administered 2016-09-09 (×2): 10 mg via INTRAVENOUS
  Administered 2016-09-09: 20 mg via INTRAVENOUS
  Administered 2016-09-09: 10 mg via INTRAVENOUS

## 2016-09-09 MED ORDER — VANCOMYCIN HCL IN DEXTROSE 1-5 GM/200ML-% IV SOLN
1000.0000 mg | Freq: Two times a day (BID) | INTRAVENOUS | Status: DC
  Administered 2016-09-09: 1000 mg via INTRAVENOUS
  Filled 2016-09-09 (×2): qty 200

## 2016-09-09 MED ORDER — HYDROMORPHONE HCL 1 MG/ML IJ SOLN
0.5000 mg | INTRAMUSCULAR | Status: AC | PRN
  Administered 2016-09-09 (×4): 0.5 mg via INTRAVENOUS

## 2016-09-09 MED ORDER — PROMETHAZINE HCL 25 MG/ML IJ SOLN
INTRAMUSCULAR | Status: AC
  Filled 2016-09-09: qty 1

## 2016-09-09 MED ORDER — OXYCODONE HCL 5 MG PO TABS
ORAL_TABLET | ORAL | Status: AC
  Filled 2016-09-09: qty 2

## 2016-09-09 MED ORDER — METOPROLOL TARTRATE 5 MG/5ML IV SOLN: 2.0000 mg | INTRAVENOUS | Status: DC | PRN

## 2016-09-09 MED ORDER — LISINOPRIL 10 MG PO TABS: 20.0000 mg | ORAL_TABLET | Freq: Every day | ORAL | Status: DC

## 2016-09-09 MED ORDER — MORPHINE SULFATE (PF) 4 MG/ML IV SOLN
INTRAVENOUS | Status: AC
  Administered 2016-09-09: 4 mg
  Filled 2016-09-09: qty 1

## 2016-09-09 MED ORDER — LIDOCAINE HCL (CARDIAC) 20 MG/ML IV SOLN
INTRAVENOUS | Status: DC | PRN
  Administered 2016-09-09: 60 mg via INTRAVENOUS

## 2016-09-09 MED ORDER — 0.9 % SODIUM CHLORIDE (POUR BTL) OPTIME
TOPICAL | Status: DC | PRN
  Administered 2016-09-09: 2000 mL

## 2016-09-09 MED ORDER — SIMVASTATIN 10 MG PO TABS
10.0000 mg | ORAL_TABLET | Freq: Every day | ORAL | Status: DC
  Administered 2016-09-09: 10 mg via ORAL
  Filled 2016-09-09: qty 1

## 2016-09-09 MED ORDER — AMITRIPTYLINE HCL 50 MG PO TABS
50.0000 mg | ORAL_TABLET | Freq: Every day | ORAL | Status: DC
  Administered 2016-09-09: 50 mg via ORAL
  Filled 2016-09-09: qty 1

## 2016-09-09 MED ORDER — CHLORHEXIDINE GLUCONATE 4 % EX LIQD: 60.0000 mL | Freq: Once | CUTANEOUS | Status: DC

## 2016-09-09 MED ORDER — MIDAZOLAM HCL 5 MG/5ML IJ SOLN
INTRAMUSCULAR | Status: DC | PRN
  Administered 2016-09-09: 2 mg via INTRAVENOUS

## 2016-09-09 MED ORDER — LAMOTRIGINE 25 MG PO TABS
25.0000 mg | ORAL_TABLET | Freq: Two times a day (BID) | ORAL | Status: DC
  Administered 2016-09-09: 25 mg via ORAL
  Filled 2016-09-09: qty 1

## 2016-09-09 MED ORDER — ALBUTEROL SULFATE (2.5 MG/3ML) 0.083% IN NEBU: 3.0000 mL | INHALATION_SOLUTION | Freq: Four times a day (QID) | RESPIRATORY_TRACT | Status: DC | PRN

## 2016-09-09 MED ORDER — ONDANSETRON HCL 4 MG/2ML IJ SOLN
INTRAMUSCULAR | Status: DC | PRN
  Administered 2016-09-09: 4 mg via INTRAVENOUS

## 2016-09-09 MED ORDER — MAGNESIUM SULFATE 2 GM/50ML IV SOLN
2.0000 g | Freq: Every day | INTRAVENOUS | Status: DC | PRN
  Filled 2016-09-09: qty 50

## 2016-09-09 MED ORDER — HYDROMORPHONE HCL 1 MG/ML IJ SOLN
0.2500 mg | INTRAMUSCULAR | Status: DC | PRN
  Administered 2016-09-09 (×4): 0.5 mg via INTRAVENOUS

## 2016-09-09 MED ORDER — LACTATED RINGERS IV SOLN
INTRAVENOUS | Status: DC
  Administered 2016-09-09 (×2): via INTRAVENOUS

## 2016-09-09 MED ORDER — OXYCODONE HCL 5 MG PO TABS
ORAL_TABLET | ORAL | Status: AC
  Administered 2016-09-09: 10 mg via ORAL
  Filled 2016-09-09: qty 1

## 2016-09-09 MED ORDER — SENNOSIDES-DOCUSATE SODIUM 8.6-50 MG PO TABS: 1.0000 | ORAL_TABLET | Freq: Every evening | ORAL | Status: DC | PRN

## 2016-09-09 MED ORDER — PROPOFOL 10 MG/ML IV BOLUS
INTRAVENOUS | Status: AC
  Filled 2016-09-09: qty 20

## 2016-09-09 MED ORDER — PROPOFOL 10 MG/ML IV BOLUS
INTRAVENOUS | Status: DC | PRN
  Administered 2016-09-09: 160 mg via INTRAVENOUS

## 2016-09-09 MED ORDER — IOPAMIDOL (ISOVUE-300) INJECTION 61%
INTRAVENOUS | Status: AC
  Filled 2016-09-09: qty 50

## 2016-09-09 MED ORDER — POTASSIUM CHLORIDE CRYS ER 20 MEQ PO TBCR: 20.0000 meq | EXTENDED_RELEASE_TABLET | Freq: Every day | ORAL | Status: DC | PRN

## 2016-09-09 MED ORDER — SODIUM CHLORIDE 0.9 % IV SOLN
INTRAVENOUS | Status: DC | PRN
  Administered 2016-09-09: 11:00:00

## 2016-09-09 MED ORDER — ALBUTEROL SULFATE HFA 108 (90 BASE) MCG/ACT IN AERS
INHALATION_SPRAY | RESPIRATORY_TRACT | Status: AC
  Filled 2016-09-09: qty 6.7

## 2016-09-09 MED ORDER — ROCURONIUM BROMIDE 10 MG/ML (PF) SYRINGE
PREFILLED_SYRINGE | INTRAVENOUS | Status: AC
  Filled 2016-09-09: qty 5

## 2016-09-09 MED ORDER — ONDANSETRON 4 MG PO TBDP: 4.0000 mg | ORAL_TABLET | Freq: Three times a day (TID) | ORAL | Status: DC | PRN

## 2016-09-09 MED ORDER — OXYCODONE HCL 5 MG PO TABS
5.0000 mg | ORAL_TABLET | ORAL | Status: DC | PRN
  Administered 2016-09-09: 5 mg via ORAL

## 2016-09-09 MED ORDER — BISACODYL 5 MG PO TBEC: 5.0000 mg | DELAYED_RELEASE_TABLET | Freq: Every day | ORAL | Status: DC | PRN

## 2016-09-09 MED ORDER — SODIUM CHLORIDE 0.9 % IV SOLN
INTRAVENOUS | Status: DC
  Administered 2016-09-09 – 2016-09-10 (×2): via INTRAVENOUS

## 2016-09-09 MED ORDER — PROMETHAZINE HCL 25 MG/ML IJ SOLN
6.2500 mg | INTRAMUSCULAR | Status: DC | PRN
Start: 2016-09-09 — End: 2016-09-09
  Administered 2016-09-09: 6.25 mg via INTRAVENOUS

## 2016-09-09 MED ORDER — LIDOCAINE 2% (20 MG/ML) 5 ML SYRINGE
INTRAMUSCULAR | Status: AC
  Filled 2016-09-09: qty 5

## 2016-09-09 MED ORDER — ALUM & MAG HYDROXIDE-SIMETH 200-200-20 MG/5ML PO SUSP: 15.0000 mL | ORAL | Status: DC | PRN

## 2016-09-09 MED ORDER — SUGAMMADEX SODIUM 200 MG/2ML IV SOLN
INTRAVENOUS | Status: DC | PRN
  Administered 2016-09-09: 200 mg via INTRAVENOUS

## 2016-09-09 MED ORDER — DOCUSATE SODIUM 100 MG PO CAPS: 100.0000 mg | ORAL_CAPSULE | Freq: Every day | ORAL | Status: DC

## 2016-09-09 MED ORDER — MIDAZOLAM HCL 2 MG/2ML IJ SOLN
INTRAMUSCULAR | Status: AC
  Filled 2016-09-09: qty 2

## 2016-09-09 MED ORDER — PHENOL 1.4 % MT LIQD: 1.0000 | OROMUCOSAL | Status: DC | PRN

## 2016-09-09 MED ORDER — OXYCODONE HCL 5 MG PO TABS
5.0000 mg | ORAL_TABLET | ORAL | Status: DC | PRN
  Administered 2016-09-09 – 2016-09-10 (×4): 10 mg via ORAL
  Filled 2016-09-09 (×3): qty 2

## 2016-09-09 MED ORDER — LISINOPRIL-HYDROCHLOROTHIAZIDE 20-25 MG PO TABS: 1.0000 | ORAL_TABLET | Freq: Every day | ORAL | Status: DC

## 2016-09-09 MED ORDER — LABETALOL HCL 5 MG/ML IV SOLN: 10.0000 mg | INTRAVENOUS | Status: DC | PRN

## 2016-09-09 MED ORDER — SODIUM CHLORIDE 0.9 % IV SOLN: 500.0000 mL | Freq: Once | INTRAVENOUS | Status: DC | PRN

## 2016-09-09 MED ORDER — SUCCINYLCHOLINE CHLORIDE 20 MG/ML IJ SOLN
INTRAMUSCULAR | Status: DC | PRN
  Administered 2016-09-09: 120 mg via INTRAVENOUS

## 2016-09-09 MED ORDER — FENTANYL CITRATE (PF) 100 MCG/2ML IJ SOLN
INTRAMUSCULAR | Status: DC | PRN
  Administered 2016-09-09: 75 ug via INTRAVENOUS
  Administered 2016-09-09 (×2): 50 ug via INTRAVENOUS
  Administered 2016-09-09: 75 ug via INTRAVENOUS

## 2016-09-09 MED ORDER — ROCURONIUM BROMIDE 100 MG/10ML IV SOLN
INTRAVENOUS | Status: DC | PRN
  Administered 2016-09-09: 10 mg via INTRAVENOUS
  Administered 2016-09-09: 50 mg via INTRAVENOUS
  Administered 2016-09-09: 10 mg via INTRAVENOUS

## 2016-09-09 MED ORDER — BUSPIRONE HCL 10 MG PO TABS
10.0000 mg | ORAL_TABLET | Freq: Two times a day (BID) | ORAL | Status: DC
  Administered 2016-09-09: 10 mg via ORAL
  Filled 2016-09-09 (×2): qty 1

## 2016-09-09 SURGICAL SUPPLY — 33 items
ADH SKN CLS APL DERMABOND .7 (GAUZE/BANDAGES/DRESSINGS) ×1
CANISTER SUCT 3000ML PPV (MISCELLANEOUS) ×3 IMPLANT
CANNULA VESSEL 3MM 2 BLNT TIP (CANNULA) ×3 IMPLANT
CLIP LIGATING EXTRA MED SLVR (CLIP) ×3 IMPLANT
CLIP LIGATING EXTRA SM BLUE (MISCELLANEOUS) ×3 IMPLANT
DERMABOND ADVANCED (GAUZE/BANDAGES/DRESSINGS) ×2
DERMABOND ADVANCED .7 DNX12 (GAUZE/BANDAGES/DRESSINGS) ×1 IMPLANT
ELECT REM PT RETURN 9FT ADLT (ELECTROSURGICAL) ×3
ELECTRODE REM PT RTRN 9FT ADLT (ELECTROSURGICAL) ×1 IMPLANT
GLOVE BIO SURGEON STRL SZ7 (GLOVE) ×2 IMPLANT
GLOVE BIOGEL PI IND STRL 7.0 (GLOVE) IMPLANT
GLOVE BIOGEL PI IND STRL 7.5 (GLOVE) IMPLANT
GLOVE BIOGEL PI INDICATOR 7.0 (GLOVE) ×4
GLOVE BIOGEL PI INDICATOR 7.5 (GLOVE) ×2
GLOVE ECLIPSE 7.0 STRL STRAW (GLOVE) ×2 IMPLANT
GLOVE SS BIOGEL STRL SZ 7.5 (GLOVE) ×1 IMPLANT
GLOVE SUPERSENSE BIOGEL SZ 7.5 (GLOVE) ×2
GOWN STRL REUS W/ TWL LRG LVL3 (GOWN DISPOSABLE) ×3 IMPLANT
GOWN STRL REUS W/TWL LRG LVL3 (GOWN DISPOSABLE) ×9
KIT BASIN OR (CUSTOM PROCEDURE TRAY) ×3 IMPLANT
KIT ROOM TURNOVER OR (KITS) ×3 IMPLANT
NS IRRIG 1000ML POUR BTL (IV SOLUTION) ×6 IMPLANT
PACK PERIPHERAL VASCULAR (CUSTOM PROCEDURE TRAY) ×3 IMPLANT
PAD ARMBOARD 7.5X6 YLW CONV (MISCELLANEOUS) ×6 IMPLANT
PATCH HEMASHIELD 8X75 (Vascular Products) ×2 IMPLANT
SUT PROLENE 5 0 C 1 24 (SUTURE) ×6 IMPLANT
SUT PROLENE 6 0 CC (SUTURE) ×2 IMPLANT
SUT VIC AB 2-0 CTX 36 (SUTURE) ×3 IMPLANT
SUT VIC AB 3-0 SH 27 (SUTURE) ×3
SUT VIC AB 3-0 SH 27X BRD (SUTURE) ×1 IMPLANT
TRAY FOLEY W/METER SILVER 16FR (SET/KITS/TRAYS/PACK) ×3 IMPLANT
UNDERPAD 30X30 (UNDERPADS AND DIAPERS) ×3 IMPLANT
WATER STERILE IRR 1000ML POUR (IV SOLUTION) ×3 IMPLANT
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
## 2016-09-09 NOTE — Progress Notes (Signed)
    Doppler right DP/PT/peroneal Right groin soft without hematoma   S/P right femoral endarterectomy Stable to transfer to [place], Izumi Mixon MAUREEN PA-C

## 2016-09-09 NOTE — Transfer of Care (Signed)
Immediate Anesthesia Transfer of Care Note  Patient: Oscar Hall  Procedure(s) Performed: Procedure(s): ENDARTERECTOMY RIGHT FEMORAL ARTERY (Right) PATCH ANGIOPLASTY RIGHT FEMORAL ARTYERY (Right)  Patient Location: PACU  Anesthesia Type:General  Level of Consciousness: awake, alert , oriented and patient cooperative  Airway & Oxygen Therapy: Patient Spontanous Breathing and Patient connected to face mask oxygen  Post-op Assessment: Report given to RN and Post -op Vital signs reviewed and stable  Post vital signs: Reviewed  Last Vitals:  Vitals:   09/09/16 0919  BP: (!) 143/86  Pulse: 92  Resp: 20  Temp: 37.1 C    Last Pain:  Vitals:   09/09/16 0925  TempSrc:   PainSc: 7       Patients Stated Pain Goal: 3 (03/47/42 5956)  Complications: No apparent anesthesia complications

## 2016-09-09 NOTE — Anesthesia Postprocedure Evaluation (Signed)
Anesthesia Post Note  Patient: Oscar Hall  Procedure(s) Performed: Procedure(s) (LRB): ENDARTERECTOMY RIGHT FEMORAL ARTERY (Right) PATCH ANGIOPLASTY RIGHT FEMORAL ARTYERY (Right)     Patient location during evaluation: PACU Anesthesia Type: General Level of consciousness: awake and alert Pain management: pain level controlled Vital Signs Assessment: post-procedure vital signs reviewed and stable Respiratory status: spontaneous breathing, nonlabored ventilation, respiratory function stable and patient connected to nasal cannula oxygen Cardiovascular status: blood pressure returned to baseline and stable Postop Assessment: no signs of nausea or vomiting Anesthetic complications: no    Last Vitals:  Vitals:   09/09/16 1516 09/09/16 1546  BP: 138/80 111/75  Pulse: 87 84  Resp: 16   Temp:      Last Pain:  Vitals:   09/09/16 1522  TempSrc:   PainSc: 5                  Tiajuana Amass

## 2016-09-09 NOTE — Op Note (Signed)
    OPERATIVE REPORT  DATE OF SURGERY: 09/09/2016  PATIENT: Oscar Hall, 56 y.o. male MRN: [id number]  DOB: February 01, 1961  PRE-OPERATIVE DIAGNOSIS: Right leg ischemia with claudication and rest pain  POST-OPERATIVE DIAGNOSIS:  Same  PROCEDURE: Right common superficial femoral and deep femoral endarterectomy and Dacron patch angioplasty  SURGEON:  Curt Jews, M.D.  PHYSICIAN ASSISTANT: Gerri Lins PA-C  ANESTHESIA:  Gen.  EBL: Minimal ml  Total I/O In: 1000 [I.V.:1000] Out: -   BLOOD ADMINISTERED: None  DRAINS: None  SPECIMEN: None  COUNTS CORRECT:  YES  PLAN OF CARE: PACU   PATIENT DISPOSITION:  PACU - hemodynamically stable  PROCEDURE DETAILS: Patient was taken to the operative placed supine position where the area of the righ  groin and right leg were prepped in usual sterile fashion. Incision was made over the femoral artery and was carried down to isolate the common superficial femoral and profundus femoris arteries. Temperature branches were controlled with Potts ties. The artery was dissected up under the inguinal ligament for control. Patient was given 10,000 units intravenous heparin and after circulation time the external iliac artery was occluded just under the inguinal ligament with Henley clamp. The superficial femoral artery was occluded with a vessel loop and the deep femoral artery was occluded with a Gregory clamp. The common femoral artery was opened longitudinally  with Potts scissors. The common femoral artery was completely occluded. The artery was occluded was patent at the level of inguinal ligament. The artery was opened onto the superficial femoral artery. There was plaque present but superficial femoral artery was patent. Endarterectomy skin on the common carotid artery and the plaque was divided approximately with Potts scissors up under the inguinal ligament. There was a good flow channel. Endarterectomy skin on the superficial femoral artery and  the plaque was divided distally with Potts scissors as well. The deep femoral artery was endarterectomized with an eversion technique with a nice feathering endpoint and good active bleeding. Remaining thrombus or debris was removed from endarterectomy plane. The Dacron Hemashield patch was brought onto the field and was sewn as a patch angioplasty with a running 5-0 Prolene suture. Prior to completion of the closure the usual flushing maneuvers were undertaken. The anastomosis completed and clamps removed with from [place] foot. Patient had excellent Doppler flow in the posterior tibial superficial femoral and common femoral arteries. The patient was given 50 mg of protamine to reverse this heparin. Wounds irrigated with saline. Hemostasis tablet cautery. The wounds were closed with 2-0 Vicryl in several layers and subcutaneous tissue. Skin was closed with 3 or septic or Vicryl sutures. Sterile dressing was applied the patient was transferred to the recovery room stable condition  Rosetta Posner, M.D., [facility] 09/09/2016 12:22 PM

## 2016-09-09 NOTE — Anesthesia Preprocedure Evaluation (Addendum)
Anesthesia Evaluation  Patient identified by MRN, date of birth, ID band Patient awake    Reviewed: Allergy & Precautions, NPO status , Patient's Chart, lab work & pertinent test results  Airway Mallampati: II  TM Distance: >3 FB Neck ROM: Full    Dental  (+) Poor Dentition, Missing, Loose, Chipped, Dental Advisory Given,    Pulmonary shortness of breath and with exertion, COPD,  COPD inhaler, Current Smoker,    + rhonchi        Cardiovascular hypertension, Pt. on medications + CAD   Rhythm:Regular Rate:Normal     Neuro/Psych PSYCHIATRIC DISORDERS Anxiety Depression Bipolar Disorder negative neurological ROS     GI/Hepatic GERD  Medicated and Controlled,(+) Hepatitis -, C  Endo/Other  negative endocrine ROS  Renal/GU Renal InsufficiencyRenal disease     Musculoskeletal  (+) Arthritis ,   Abdominal   Peds  Hematology negative hematology ROS (+)   Anesthesia Other Findings   Reproductive/Obstetrics                           Lab Results  Component Value Date   WBC 11.0 (H) 09/07/2016   HGB 16.4 09/07/2016   HCT 47.8 09/07/2016   MCV 95.2 09/07/2016   PLT 259 09/07/2016   Lab Results  Component Value Date   CREATININE 1.22 09/07/2016   BUN 11 09/07/2016   NA 134 (L) 09/07/2016   K 3.8 09/07/2016   CL 99 (L) 09/07/2016   CO2 24 09/07/2016    Anesthesia Physical Anesthesia Plan  ASA: III  Anesthesia Plan: General   Post-op Pain Management:    Induction: Intravenous  PONV Risk Score and Plan:   Airway Management Planned: Oral ETT  Additional Equipment:   Intra-op Plan:   Post-operative Plan: Extubation in OR  Informed Consent: I have reviewed the patients History and Physical, chart, labs and discussed the procedure including the risks, benefits and alternatives for the proposed anesthesia with the patient or authorized representative who has indicated his/her  understanding and acceptance.   Dental advisory given  Plan Discussed with: CRNA  Anesthesia Plan Comments:         Anesthesia Quick Evaluation

## 2016-09-09 NOTE — Interval H&P Note (Signed)
History and Physical Interval Note:  09/09/2016 9:15 AM  Oscar Hall  has presented today for surgery, with the diagnosis of Peripheral Vascular Disease with right lower extremity rest pain I70.221  The various methods of treatment have been discussed with the patient and family. After consideration of risks, benefits and other options for treatment, the patient has consented to  Procedure(s): ENDARTERECTOMY FEMORAL (Right) as a surgical intervention .  The patient's history has been reviewed, patient examined, no change in status, stable for surgery.  I have reviewed the patient's chart and labs.  Questions were answered to the patient's satisfaction.     Curt Jews

## 2016-09-09 NOTE — Anesthesia Procedure Notes (Signed)
Procedure Name: Intubation Date/Time: 09/09/2016 10:41 AM Performed by: Jenne Campus Pre-anesthesia Checklist: Patient identified, Emergency Drugs available, Suction available and Patient being monitored Patient Re-evaluated:Patient Re-evaluated prior to inductionOxygen Delivery Method: Circle System Utilized Preoxygenation: Pre-oxygenation with 100% oxygen Intubation Type: IV induction Ventilation: Mask ventilation without difficulty Laryngoscope Size: Miller and 3 Grade View: Grade I Tube type: Oral Tube size: 8.0 mm Number of attempts: 1 Airway Equipment and Method: Stylet and Oral airway Placement Confirmation: ETT inserted through vocal cords under direct vision,  positive ETCO2 and breath sounds checked- equal and bilateral Secured at: 22 cm Tube secured with: Tape Dental Injury: Teeth and Oropharynx as per pre-operative assessment
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
## 2016-09-10 ENCOUNTER — Encounter (HOSPITAL_COMMUNITY): Payer: Self-pay | Admitting: Vascular Surgery

## 2016-09-10 LAB — BASIC METABOLIC PANEL
Anion gap: 7 (ref 5–15)
BUN: 7 mg/dL (ref 6–20)
CHLORIDE: 98 mmol/L — AB (ref 101–111)
CO2: 30 mmol/L (ref 22–32)
CREATININE: 1.2 mg/dL (ref 0.61–1.24)
Calcium: 8.9 mg/dL (ref 8.9–10.3)
GFR calc Af Amer: >60 mL/min (ref >60)
GFR calc non Af Amer: >60 mL/min (ref >60)
GLUCOSE: 105 mg/dL — AB (ref 65–99)
POTASSIUM: 4.1 mmol/L (ref 3.5–5.1)
SODIUM: 135 mmol/L (ref 135–145)

## 2016-09-10 LAB — CBC
HEMATOCRIT: 47.3 % (ref 39.0–52.0)
HEMOGLOBIN: 15.2 g/dL (ref 13.0–17.0)
MCH: 31.7 pg (ref 26.0–34.0)
MCHC: 32.1 g/dL (ref 30.0–36.0)
MCV: 98.7 fL (ref 78.0–100.0)
Platelets: 249 10*3/uL (ref 150–400)
RBC: 4.79 MIL/uL (ref 4.22–5.81)
RDW: 13.6 % (ref 11.5–15.5)
WBC: 11.7 10*3/uL — ABNORMAL HIGH (ref 4.0–10.5)

## 2016-09-10 MED ORDER — OXYCODONE HCL 10 MG PO TABS: 10.0000 mg | ORAL_TABLET | ORAL | 0 refills | Status: DC | PRN

## 2016-09-10 MED ORDER — OXYCODONE HCL 5 MG PO TABS: 10.0000 mg | ORAL_TABLET | ORAL | 0 refills | Status: DC | PRN

## 2016-09-10 MED ORDER — OXYCODONE HCL 5 MG PO TABS: 5.0000 mg | ORAL_TABLET | ORAL | 0 refills | Status: DC | PRN

## 2016-09-10 NOTE — Progress Notes (Signed)
Subjective: Interval History: none.. Comfortable this morning. Reports soreness in right groin. Reports complete resolution of pain and coldness and aching in his right foot.  Objective: Vital signs in last 24 hours: Temp:  [97.3 F (36.3 C)-98.8 F (37.1 C)] 98.1 F (36.7 C) (06/21 0612) Pulse Rate:  [79-98] 84 (06/21 0612) Resp:  [8-21] 18 (06/21 0612) BP: (110-143)/(68-95) 118/77 (06/21 0612) SpO2:  [91 %-98 %] 96 % (06/21 0612) Weight:  [271 lb (122.9 kg)-279 lb (126.6 kg)] 279 lb (126.6 kg) (06/20 2017)  Intake/Output from previous day: 06/20 0701 - 06/21 0700 In: 3297.1 [P.O.:462; I.V.:2635.1; IV Piggyback:200] Out: 6387 [Urine:3600; Blood:75] Intake/Output this shift: Total I/O In: 1035.1 [I.V.:835.1; IV Piggyback:200] Out: 1850 [Urine:1850]  No hematoma in his right groin. Wound edges are viable. Palpable dorsalis pedis pulse.  Lab Results:  Recent Labs  09/07/16 1552 09/10/16 0311  WBC 11.0* 11.7*  HGB 16.4 15.2  HCT 47.8 47.3  PLT 259 249   BMET  Recent Labs  09/07/16 1552 09/10/16 0311  NA 134* 135  K 3.8 4.1  CL 99* 98*  CO2 24 30  GLUCOSE 133* 105*  BUN 11 7  CREATININE 1.22 1.20  CALCIUM 9.1 8.9    Studies/Results: Dg Abd Acute W/chest  Result Date: 08/16/2016 CLINICAL DATA:  56 year old male complaining of nausea and vomiting with back and rib pain. Abdominal pain. EXAM: DG ABDOMEN ACUTE W/ 1V CHEST COMPARISON:  Chest x-ray 07/20/2014. FINDINGS: Diffuse peribronchial cuffing. Lung volumes are normal. No consolidative airspace disease. No pleural effusions. No pneumothorax. No pulmonary nodule or mass noted. Pulmonary vasculature and the cardiomediastinal silhouette are within normal limits. Atherosclerosis in the thoracic aorta. Surgical clips project over the right upper quadrant of the abdomen, suggesting prior cholecystectomy. Gas and stool are seen scattered throughout the colon extending to the level of the distal rectum. No pathologic  distension of small bowel is noted. No gross evidence of pneumoperitoneum. IMPRESSION: 1.  Nonobstructive bowel gas pattern. 2. No pneumoperitoneum. 3. Mild diffuse peribronchial cuffing may suggest an acute bronchitis. 4. Aortic atherosclerosis. Electronically Signed   By: Vinnie Langton M.D.   On: 08/16/2016 17:39   Anti-infectives: Anti-infectives    Start     Dose/Rate Route Frequency Ordered Stop   09/09/16 2045  vancomycin (VANCOCIN) IVPB 1000 mg/200 mL premix     1,000 mg 200 mL/hr over 60 Minutes Intravenous Every 12 hours 09/09/16 2043 09/10/16 2044   09/09/16 0845  vancomycin (VANCOCIN) 1,500 mg in sodium chloride 0.9 % 500 mL IVPB     1,500 mg 250 mL/hr over 120 Minutes Intravenous 120 min pre-op 09/08/16 1130 09/09/16 1222      Assessment/Plan: s/p Procedure(s): ENDARTERECTOMY RIGHT FEMORAL ARTERY (Right) PATCH ANGIOPLASTY RIGHT FEMORAL ARTYERY (Right) Stable postop day 1. For discharge to home today. Requesting oxycodone 10 mg #20  at discharge. I told him this would be acceptable for 1 prescription for short period of time but did to his groin incision discomfort should resolve within several days. Will not begin long-term pain management. He understands and reports that he is not requesting this.   LOS: 1 day   Curt Jews 09/10/2016, 6:49 AM

## 2016-09-10 NOTE — Progress Notes (Signed)
Oscar Hall to be D/C'd Home per MD order.  Discussed with the patient and all questions fully answered.  VSS, Skin clean, dry and intact without evidence of skin break down, no evidence of skin tears noted. IV catheter discontinued intact. Site without signs and symptoms of complications. Dressing and pressure applied.  An After Visit Summary was printed and given to the patient. Patient received prescription.  D/c education completed with patient/family including follow up instructions, medication list, d/c activities limitations if indicated, with other d/c instructions as indicated by MD - patient able to verbalize understanding, all questions fully answered.   Patient instructed to return to ED, call 911, or call MD for any changes in condition.   Patient refused to be escorted via [place], and D/C home via private auto.  Karolee Ohs 09/10/2016 9:13 AM

## 2016-09-14 ENCOUNTER — Emergency Department (HOSPITAL_BASED_OUTPATIENT_CLINIC_OR_DEPARTMENT_OTHER)
Admit: 2016-09-14 | Discharge: 2016-09-14 | Disposition: A | Discharge status: Home / Self Care | Payer: Medicaid Other | Attending: Emergency Medicine | Admitting: Emergency Medicine

## 2016-09-14 ENCOUNTER — Emergency Department (HOSPITAL_COMMUNITY)
Admission: EM | Admit: 2016-09-14 | Discharge: 2016-09-14 | Disposition: A | Discharge status: Home / Self Care | Payer: Medicaid Other | Attending: Emergency Medicine | Admitting: Emergency Medicine

## 2016-09-14 ENCOUNTER — Encounter (HOSPITAL_COMMUNITY): Payer: Self-pay | Admitting: Nurse Practitioner

## 2016-09-14 DIAGNOSIS — I739 Peripheral vascular disease, unspecified: Secondary | ICD-10-CM

## 2016-09-14 DIAGNOSIS — I2510 Atherosclerotic heart disease of native coronary artery without angina pectoris: Secondary | ICD-10-CM | POA: Diagnosis not present

## 2016-09-14 DIAGNOSIS — J449 Chronic obstructive pulmonary disease, unspecified: Secondary | ICD-10-CM | POA: Insufficient documentation

## 2016-09-14 DIAGNOSIS — I10 Essential (primary) hypertension: Secondary | ICD-10-CM | POA: Diagnosis not present

## 2016-09-14 DIAGNOSIS — G8918 Other acute postprocedural pain: Secondary | ICD-10-CM | POA: Insufficient documentation

## 2016-09-14 DIAGNOSIS — F17210 Nicotine dependence, cigarettes, uncomplicated: Secondary | ICD-10-CM | POA: Diagnosis not present

## 2016-09-14 DIAGNOSIS — Z79899 Other long term (current) drug therapy: Secondary | ICD-10-CM | POA: Diagnosis not present

## 2016-09-14 DIAGNOSIS — R1030 Lower abdominal pain, unspecified: Secondary | ICD-10-CM | POA: Diagnosis present

## 2016-09-14 LAB — CBC WITH DIFFERENTIAL/PLATELET
BASOS PCT: 0 %
Basophils Absolute: 0.1 10*3/uL (ref 0.0–0.1)
EOS ABS: 0.6 10*3/uL (ref 0.0–0.7)
EOS PCT: 5 %
HCT: 44.7 % (ref 39.0–52.0)
Hemoglobin: 15.8 g/dL (ref 13.0–17.0)
LYMPHS PCT: 22 %
Lymphs Abs: 2.7 10*3/uL (ref 0.7–4.0)
MCH: 33 pg (ref 26.0–34.0)
MCHC: 35.3 g/dL (ref 30.0–36.0)
MCV: 93.3 fL (ref 78.0–100.0)
MONO ABS: 0.8 10*3/uL (ref 0.1–1.0)
Monocytes Relative: 6 %
Neutro Abs: 8.5 10*3/uL — ABNORMAL HIGH (ref 1.7–7.7)
Neutrophils Relative %: 67 %
PLATELETS: 289 10*3/uL (ref 150–400)
RBC: 4.79 MIL/uL (ref 4.22–5.81)
RDW: 12.9 % (ref 11.5–15.5)
WBC: 12.7 10*3/uL — ABNORMAL HIGH (ref 4.0–10.5)

## 2016-09-14 LAB — BASIC METABOLIC PANEL
ANION GAP: 11 (ref 5–15)
BUN: 9 mg/dL (ref 6–20)
CALCIUM: 9.6 mg/dL (ref 8.9–10.3)
CO2: 25 mmol/L (ref 22–32)
Chloride: 97 mmol/L — ABNORMAL LOW (ref 101–111)
Creatinine, Ser: 1.17 mg/dL (ref 0.61–1.24)
GFR calc Af Amer: >60 mL/min (ref >60)
GLUCOSE: 117 mg/dL — AB (ref 65–99)
Potassium: 3.5 mmol/L (ref 3.5–5.1)
SODIUM: 133 mmol/L — AB (ref 135–145)

## 2016-09-14 MED ORDER — OXYCODONE HCL 10 MG PO TABS: 10.0000 mg | ORAL_TABLET | Freq: Four times a day (QID) | ORAL | 0 refills | Status: DC | PRN

## 2016-09-14 MED ORDER — HYDROMORPHONE HCL 1 MG/ML IJ SOLN
1.0000 mg | Freq: Once | INTRAMUSCULAR | Status: AC
  Administered 2016-09-14: 1 mg via INTRAVENOUS
  Filled 2016-09-14: qty 1

## 2016-09-14 NOTE — ED Notes (Signed)
Placed patient in room waiting on the provider patient is resting with family at beside

## 2016-09-14 NOTE — Progress Notes (Signed)
VASCULAR LAB PRELIMINARY  PRELIMINARY  PRELIMINARY  PRELIMINARY  Right lower extremity venous duplex completed.    Preliminary report:  There is no obvious evidence of DVT or SVT noted in the right lower extremity.  Called report to Dr. Jerrell Mylar, [facility], RVT 09/14/2016, 5:34 PM

## 2016-09-14 NOTE — ED Notes (Signed)
Pt irate stating "no one will help me and I'm sick of it"

## 2016-09-14 NOTE — Progress Notes (Signed)
VASCULAR LAB PRELIMINARY  PRELIMINARY  PRELIMINARY  PRELIMINARY  Ultrasound of the right groin completed.    Preliminary report:  There is no obvious evidence of pseudoaneurysm or AV fistula noted in the right groin.  Called report to Dr. Jerrell Mylar, [facility], RVT 09/14/2016, 5:34 PM

## 2016-09-14 NOTE — Progress Notes (Signed)
Patient ID: Oscar Hall, male   DOB: Apr 06, 1960, 56 y.o.   MRN: [id number] Patient is well-known to me from recent right femoral endarterectomy and patch angioplasty. Surgery was 5 days ago. Was discharged home on postoperative day #1. Presented to our office today complaining of swelling and pain. Apparently became agitated when I was told that I wasn't there and came to the emergency room. He has had a venous duplex showing no DVT and also no pseudoaneurysm.  On physical exam he has 2+ dorsalis pedis pulse on the right. He does have some bruising in his groin on his medial thigh. No warmth to suggest any evidence of [place] Em[place] infection.  Reports that the pain is in his groin and that his rest pain and claudication is completely resolved.  Discussed with the patient and his family present. Does have diffuse hematoma which is causing the induration and bruising on the medial thigh and also into his scrotum. Reassured this is I will be self-limiting and resolve. Does have an issue with the pain control. Have suggested to the emergency department provider that I feel would be appropriate for an additional prescription for oxycodone 10 mg #20 no refill. He is to see me in 2 weeks.

## 2016-09-14 NOTE — ED Triage Notes (Addendum)
Pt presents with c/o right groin pain. He had a right femoral artery endarterectomy here on 6/20 and has had increasing pain at the site since. He has taken all of his pain mediations and was told he can not get a refill until he is seen for follow up. He was unable to follow up in clinic so he came to the emergency department. He reports some clear to red drainage from the site and large amount of bruising around the area.
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
## 2016-09-14 NOTE — Discharge Instructions (Signed)
Your ultrasounds are reassuring and there are no complications of your surgery Please follow-up closely with Dr. Donnetta Hutching. Return for worsening symptoms, including fever, worsening redness/swelling, or any other symptoms concerning to you.
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
## 2016-09-28 ENCOUNTER — Encounter: Payer: Self-pay | Admitting: Family

## 2016-09-29 ENCOUNTER — Encounter: Payer: Self-pay | Admitting: Family

## 2016-09-29 ENCOUNTER — Telehealth: Payer: Self-pay | Admitting: Vascular Surgery

## 2016-09-29 ENCOUNTER — Ambulatory Visit (INDEPENDENT_AMBULATORY_CARE_PROVIDER_SITE_OTHER): Payer: Self-pay | Admitting: Family

## 2016-09-29 VITALS — BP 143/90 | HR 93 | Temp 97.2°F | Resp 20 | Ht 71.0 in | Wt 264.0 lb

## 2016-09-29 DIAGNOSIS — G8918 Other acute postprocedural pain: Secondary | ICD-10-CM

## 2016-09-29 DIAGNOSIS — F17200 Nicotine dependence, unspecified, uncomplicated: Secondary | ICD-10-CM

## 2016-09-29 DIAGNOSIS — L02219 Cutaneous abscess of trunk, unspecified: Secondary | ICD-10-CM

## 2016-09-29 DIAGNOSIS — L03319 Cellulitis of trunk, unspecified: Secondary | ICD-10-CM

## 2016-09-29 DIAGNOSIS — I779 Disorder of arteries and arterioles, unspecified: Secondary | ICD-10-CM

## 2016-09-29 MED ORDER — OXYCODONE HCL 10 MG PO TABS: 10.0000 mg | ORAL_TABLET | Freq: Four times a day (QID) | ORAL | 0 refills | Status: DC | PRN

## 2016-09-29 MED ORDER — CIPROFLOXACIN HCL 500 MG PO TABS
500.0000 mg | ORAL_TABLET | Freq: Two times a day (BID) | ORAL | 0 refills | Status: DC
Start: 2016-09-29 — End: 2016-10-06
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
## 2016-09-29 NOTE — Telephone Encounter (Signed)
Sched appt 10/20/16; lab at 10:00 and MD at 10:30. Pt had appts on 09/29/16 and 7/17 added. Pt already has 10/20/16 appt.

## 2016-09-29 NOTE — Telephone Encounter (Signed)
-----   Message from Mena Goes, RN sent at 09/10/2016  8:27 AM EDT ----- Regarding: 2-3 weeks w/ ABI   ----- Message ----- From: Ulyses Amor, PA-C Sent: 09/10/2016   7:12 AM To: Vvs Charge Pool  S/P right femoral endarterectomy needs abi's with Dr. Donnetta Hutching 2-3 weeks

## 2016-10-06 ENCOUNTER — Encounter: Payer: Self-pay | Admitting: Family

## 2016-10-06 ENCOUNTER — Ambulatory Visit (INDEPENDENT_AMBULATORY_CARE_PROVIDER_SITE_OTHER): Payer: Self-pay | Admitting: Family

## 2016-10-06 VITALS — BP 126/94 | HR 127 | Temp 97.1°F | Resp 20 | Ht 71.0 in | Wt 253.0 lb

## 2016-10-06 DIAGNOSIS — L03319 Cellulitis of trunk, unspecified: Secondary | ICD-10-CM

## 2016-10-06 DIAGNOSIS — Z5189 Encounter for other specified aftercare: Secondary | ICD-10-CM

## 2016-10-06 DIAGNOSIS — I779 Disorder of arteries and arterioles, unspecified: Secondary | ICD-10-CM

## 2016-10-06 DIAGNOSIS — L02219 Cutaneous abscess of trunk, unspecified: Secondary | ICD-10-CM

## 2016-10-06 DIAGNOSIS — F17200 Nicotine dependence, unspecified, uncomplicated: Secondary | ICD-10-CM

## 2016-10-06 DIAGNOSIS — G8918 Other acute postprocedural pain: Secondary | ICD-10-CM

## 2016-10-06 MED ORDER — OXYCODONE HCL 10 MG PO TABS: 10.0000 mg | ORAL_TABLET | Freq: Four times a day (QID) | ORAL | 0 refills | Status: AC | PRN

## 2016-10-06 MED ORDER — CIPROFLOXACIN HCL 500 MG PO TABS: 500.0000 mg | ORAL_TABLET | Freq: Two times a day (BID) | ORAL | 0 refills | Status: DC
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
## 2016-10-06 NOTE — Patient Instructions (Signed)
Steps to Quit Smoking Smoking tobacco can be bad for your health. It can also affect almost every organ in your body. Smoking puts you and people around you at risk for many serious long-lasting (chronic) diseases. Quitting smoking is hard, but it is one of the best things that you can do for your health. It is never too late to quit. What are the benefits of quitting smoking? When you quit smoking, you lower your risk for getting serious diseases and conditions. They can include:  Lung cancer or lung disease.  Heart disease.  Stroke.  Heart attack.  Not being able to have children (infertility).  Weak bones (osteoporosis) and broken bones (fractures).  If you have coughing, wheezing, and shortness of breath, those symptoms may get better when you quit. You may also get sick less often. If you are pregnant, quitting smoking can help to lower your chances of having a baby of low birth weight. What can I do to help me quit smoking? Talk with your doctor about what can help you quit smoking. Some things you can do (strategies) include:  Quitting smoking totally, instead of slowly cutting back how much you smoke over a period of time.  Going to in-person counseling. You are more likely to quit if you go to many counseling sessions.  Using resources and support systems, such as: ? Online chats with a counselor. ? Phone quitlines. ? Printed self-help materials. ? Support groups or group counseling. ? Text messaging programs. ? Mobile phone apps or applications.  Taking medicines. Some of these medicines may have nicotine in them. If you are pregnant or breastfeeding, do not take any medicines to quit smoking unless your doctor says it is okay. Talk with your doctor about counseling or other things that can help you.  Talk with your doctor about using more than one strategy at the same time, such as taking medicines while you are also going to in-person counseling. This can help make  quitting easier. What things can I do to make it easier to quit? Quitting smoking might feel very hard at first, but there is a lot that you can do to make it easier. Take these steps:  Talk to your family and friends. Ask them to support and encourage you.  Call phone quitlines, reach out to support groups, or work with a counselor.  Ask people who smoke to not smoke around you.  Avoid places that make you want (trigger) to smoke, such as: ? Bars. ? Parties. ? Smoke-break areas at work.  Spend time with people who do not smoke.  Lower the stress in your life. Stress can make you want to smoke. Try these things to help your stress: ? Getting regular exercise. ? Deep-breathing exercises. ? Yoga. ? Meditating. ? Doing a body scan. To do this, close your eyes, focus on one area of your body at a time from head to toe, and notice which parts of your body are tense. Try to relax the muscles in those areas.  Download or buy apps on your mobile phone or tablet that can help you stick to your quit plan. There are many free apps, such as QuitGuide from the CDC (Centers for Disease Control and Prevention). You can find more support from smokefree.gov and other websites.  This information is not intended to replace advice given to you by your health care provider. Make sure you discuss any questions you have with your health care provider. Document Released: 01/03/2009 Document   Revised: 11/05/2015 Document Reviewed: 07/24/2014 Elsevier Interactive Patient Education  2018 Elsevier Inc.     Peripheral Vascular Disease Peripheral vascular disease (PVD) is a disease of the blood vessels that are not part of your heart and brain. A simple term for PVD is poor circulation. In most cases, PVD narrows the blood vessels that carry blood from your heart to the rest of your body. This can result in a decreased supply of blood to your arms, legs, and internal organs, like your stomach or kidneys.  However, it most often affects a person's lower legs and feet. There are two types of PVD.  Organic PVD. This is the more common type. It is caused by damage to the structure of blood vessels.  Functional PVD. This is caused by conditions that make blood vessels contract and tighten (spasm).  Without treatment, PVD tends to get worse over time. PVD can also lead to acute ischemic limb. This is when an arm or limb suddenly has trouble getting enough blood. This is a medical emergency. Follow these instructions at home:  Take medicines only as told by your doctor.  Do not use any tobacco products, including cigarettes, chewing tobacco, or electronic cigarettes. If you need help quitting, ask your doctor.  Lose weight if you are overweight, and maintain a healthy weight as told by your doctor.  Eat a diet that is low in fat and cholesterol. If you need help, ask your doctor.  Exercise regularly. Ask your doctor for some good activities for you.  Take good care of your feet. ? Wear comfortable shoes that fit well. ? Check your feet often for any cuts or sores. Contact a doctor if:  You have cramps in your legs while walking.  You have leg pain when you are at rest.  You have coldness in a leg or foot.  Your skin changes.  You are unable to get or have an erection (erectile dysfunction).  You have cuts or sores on your feet that are not healing. Get help right away if:  Your arm or leg turns cold and blue.  Your arms or legs become red, warm, swollen, painful, or numb.  You have chest pain or trouble breathing.  You suddenly have weakness in your face, arm, or leg.  You become very confused or you cannot speak.  You suddenly have a very bad headache.  You suddenly cannot see. This information is not intended to replace advice given to you by your health care provider. Make sure you discuss any questions you have with your health care provider. Document Released:  06/03/2009 Document Revised: 08/15/2015 Document Reviewed: 08/17/2013 Elsevier Interactive Patient Education  2017 Elsevier Inc.  

## 2016-10-12 ENCOUNTER — Encounter: Payer: Self-pay | Admitting: Vascular Surgery

## 2016-10-13 NOTE — Addendum Note (Signed)
Addended by: Lianne Cure A on: 10/13/2016 04:53 PM   Modules accepted: Orders

## 2016-10-14 ENCOUNTER — Encounter (HOSPITAL_COMMUNITY): Payer: Self-pay | Admitting: Emergency Medicine

## 2016-10-14 ENCOUNTER — Emergency Department (HOSPITAL_COMMUNITY)
Admission: EM | Admit: 2016-10-14 | Discharge: 2016-10-14 | Disposition: A | Discharge status: Home / Self Care | Payer: Medicaid Other | Attending: Emergency Medicine | Admitting: Emergency Medicine

## 2016-10-14 DIAGNOSIS — I2510 Atherosclerotic heart disease of native coronary artery without angina pectoris: Secondary | ICD-10-CM | POA: Diagnosis not present

## 2016-10-14 DIAGNOSIS — R112 Nausea with vomiting, unspecified: Secondary | ICD-10-CM | POA: Insufficient documentation

## 2016-10-14 DIAGNOSIS — G8918 Other acute postprocedural pain: Secondary | ICD-10-CM | POA: Insufficient documentation

## 2016-10-14 DIAGNOSIS — F17210 Nicotine dependence, cigarettes, uncomplicated: Secondary | ICD-10-CM | POA: Insufficient documentation

## 2016-10-14 DIAGNOSIS — J449 Chronic obstructive pulmonary disease, unspecified: Secondary | ICD-10-CM | POA: Diagnosis not present

## 2016-10-14 DIAGNOSIS — Z79899 Other long term (current) drug therapy: Secondary | ICD-10-CM | POA: Diagnosis not present

## 2016-10-14 MED ORDER — FENTANYL CITRATE (PF) 100 MCG/2ML IJ SOLN
50.0000 ug | INTRAMUSCULAR | Status: DC | PRN
  Administered 2016-10-14: 50 ug via INTRAVENOUS
  Filled 2016-10-14: qty 2

## 2016-10-14 MED ORDER — ONDANSETRON HCL 4 MG/2ML IJ SOLN
4.0000 mg | Freq: Once | INTRAMUSCULAR | Status: AC
  Administered 2016-10-14: 4 mg via INTRAVENOUS
  Filled 2016-10-14: qty 2

## 2016-10-14 MED ORDER — ONDANSETRON 4 MG PO TBDP: 4.0000 mg | ORAL_TABLET | Freq: Three times a day (TID) | ORAL | 0 refills | Status: DC | PRN

## 2016-10-14 MED ORDER — SODIUM CHLORIDE 0.9 % IV BOLUS (SEPSIS)
1000.0000 mL | Freq: Once | INTRAVENOUS | Status: AC
  Administered 2016-10-14: 1000 mL via INTRAVENOUS

## 2016-10-14 NOTE — ED Triage Notes (Signed)
Pt c/o right groin pain from previous surgery after vomiting today. Pt was given 43mcg of fentanyl, 4mg  of zofran and 500cc of NS enroute by ems.
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
## 2016-10-14 NOTE — Discharge Instructions (Signed)
Zofran for nausea.  Tylenol and ice pack for incision pain.

## 2016-10-16 ENCOUNTER — Telehealth: Payer: Self-pay

## 2016-10-16 NOTE — Telephone Encounter (Signed)
-----   Message from Rosetta Posner, MD sent at 10/16/2016  4:13 PM EDT ----- Seen in the [facility] .  Needs to see me this Tuesday for groin check

## 2016-10-20 ENCOUNTER — Ambulatory Visit (INDEPENDENT_AMBULATORY_CARE_PROVIDER_SITE_OTHER): Payer: Self-pay | Admitting: Vascular Surgery

## 2016-10-20 ENCOUNTER — Other Ambulatory Visit: Payer: Self-pay

## 2016-10-20 ENCOUNTER — Ambulatory Visit (HOSPITAL_COMMUNITY)
Admission: RE | Admit: 2016-10-20 | Discharge: 2016-10-20 | Disposition: A | Discharge status: Home / Self Care | Payer: Medicaid Other | Source: Ambulatory Visit | Attending: Vascular Surgery | Admitting: Vascular Surgery

## 2016-10-20 ENCOUNTER — Encounter: Payer: Self-pay | Admitting: Vascular Surgery

## 2016-10-20 VITALS — BP 131/88 | HR 109 | Temp 97.8°F | Resp 20 | Ht 71.0 in | Wt 244.5 lb

## 2016-10-20 DIAGNOSIS — I779 Disorder of arteries and arterioles, unspecified: Secondary | ICD-10-CM

## 2016-10-20 DIAGNOSIS — F17200 Nicotine dependence, unspecified, uncomplicated: Secondary | ICD-10-CM | POA: Diagnosis not present

## 2016-10-20 DIAGNOSIS — R9439 Abnormal result of other cardiovascular function study: Secondary | ICD-10-CM | POA: Insufficient documentation

## 2016-10-20 MED ORDER — OXYCODONE HCL 5 MG PO TABS: 5.0000 mg | ORAL_TABLET | Freq: Three times a day (TID) | ORAL | 0 refills | Status: DC | PRN

## 2016-10-20 NOTE — Progress Notes (Signed)
Patient name: Oscar Hall MRN: [id number] DOB: 03-Dec-1960 Sex: male  REASON FOR VISIT: Follow-up right common femoral endarterectomy and patch on 09/09/2016  HPI: Oscar Hall is a 56 y.o. male for follow-up. He actually looks quite good today. He reports minimal discomfort associated with his groin. He did have difficulty with the severe nausea and vomiting following 2 weeks of Cipro. He was seen in both [facility]. He says that this is completely resolved after he discontinued his Cipro. Does have multiple other antibiotic allergies. He did undergo CT of his abdomen and pelvis without contrast at [facility] and I have these pictures for review and showed the images to the patient and his wife as well.  Current Outpatient Prescriptions  Medication Sig Dispense Refill  . albuterol (PROVENTIL HFA;VENTOLIN HFA) 108 (90 Base) MCG/ACT inhaler Inhale 2 puffs into the lungs every 6 (six) hours as needed for wheezing or shortness of breath. 3 Inhaler 3  . amitriptyline (ELAVIL) 50 MG tablet Take 1 tablet (50 mg total) by mouth at bedtime. 90 tablet 3  . busPIRone (BUSPAR) 10 MG tablet Take 1 tablet (10 mg total) by mouth 2 (two) times daily. 180 tablet 1  . ciprofloxacin (CIPRO) 500 MG tablet Take 1 tablet (500 mg total) by mouth 2 (two) times daily. 20 tablet 0  . clobetasol ointment (TEMOVATE) 0.05 % Apply to itching bumps twice a day as needed. Do not apply to face or groin.    Marland Kitchen lamoTRIgine (LAMICTAL) 25 MG tablet Take 2 tablets (50 mg total) by mouth daily. (Patient taking differently: Take 25 mg by mouth 2 (two) times daily. ) 180 tablet 1  . lidocaine (LIDODERM) 5 % Place 1 patch onto the skin daily. Remove & Discard patch within 12 hours or as directed by MD 30 patch 0  . lisinopril-hydrochlorothiazide (PRINZIDE,ZESTORETIC) 20-25 MG tablet Take 1 tablet by mouth daily. 90 tablet 3  . omeprazole (PRILOSEC) 40 MG capsule Take 1 capsule (40  mg total) by mouth daily. 90 capsule 3  . ondansetron (ZOFRAN ODT) 4 MG disintegrating tablet Take 1 tablet (4 mg total) by mouth every 8 (eight) hours as needed for nausea. 6 tablet 0  . oxyCODONE (OXY IR/ROXICODONE) 5 MG immediate release tablet Take 1 tablet (5 mg total) by mouth every 8 (eight) hours as needed. 10 tablet 0  . Oxycodone HCl 10 MG TABS Take 1 tablet (10 mg total) by mouth every 6 (six) hours as needed (severe pain). 20 tablet 0  . simvastatin (ZOCOR) 10 MG tablet Take 10 mg by mouth daily.    . simvastatin (ZOCOR) 20 MG tablet Take 1 tablet (20 mg total) by mouth at bedtime. 90 tablet 1  . tiotropium (SPIRIVA HANDIHALER) 18 MCG inhalation capsule Place 1 capsule (18 mcg total) into inhaler and inhale daily. 90 capsule 1   No current facility-administered medications for this visit.      PHYSICAL EXAM: Vitals:   10/20/16 1047 10/20/16 1051  BP: (!) 142/92 131/88  Pulse: (!) 109   Resp: 20   Temp: 97.8 F (36.6 C)   TempSrc: Oral   SpO2: 95%   Weight: 244 lb 8 oz (110.9 kg)   Height: 5\' 11"  (1.803 m)     GENERAL: The patient is a well-nourished male, in no acute distress. The vital signs are documented above. Groin incision is completely healed with no fluctuance and no erythema.  MEDICAL ISSUES: There is quite good today. He does have  a 3 cm area of subcutaneous collection consistent with probable seroma. I did explain that this could potentially be an abscess in the only other option would be to open this surgically for debridement. He certainly wishes to avoid this if at all possible. I feel the safe to continue to keep a close eye on this off antibiotics. Explained that if he has any fevers or erythema or drainage from his incision that he needs to contact us immediately. Otherwise we'll see him in 3 weeks for continued   Rosetta Posner, MD [facility] Tel [phone] Pager [phone]

## 2016-11-26 ENCOUNTER — Ambulatory Visit (INDEPENDENT_AMBULATORY_CARE_PROVIDER_SITE_OTHER): Payer: Medicaid Other | Admitting: Psychiatry

## 2016-11-26 ENCOUNTER — Encounter (HOSPITAL_COMMUNITY): Payer: Self-pay | Admitting: Psychiatry

## 2016-11-26 VITALS — BP 128/78 | HR 94 | Resp 18 | Ht 71.0 in | Wt 252.0 lb

## 2016-11-26 DIAGNOSIS — F1021 Alcohol dependence, in remission: Secondary | ICD-10-CM | POA: Diagnosis not present

## 2016-11-26 DIAGNOSIS — F3113 Bipolar disorder, current episode manic without psychotic features, severe: Secondary | ICD-10-CM

## 2016-11-26 DIAGNOSIS — Z818 Family history of other mental and behavioral disorders: Secondary | ICD-10-CM

## 2016-11-26 DIAGNOSIS — Z811 Family history of alcohol abuse and dependence: Secondary | ICD-10-CM

## 2016-11-26 DIAGNOSIS — M549 Dorsalgia, unspecified: Secondary | ICD-10-CM | POA: Diagnosis not present

## 2016-11-26 DIAGNOSIS — Z813 Family history of other psychoactive substance abuse and dependence: Secondary | ICD-10-CM

## 2016-11-26 DIAGNOSIS — G894 Chronic pain syndrome: Secondary | ICD-10-CM

## 2016-11-26 DIAGNOSIS — F4310 Post-traumatic stress disorder, unspecified: Secondary | ICD-10-CM | POA: Diagnosis not present

## 2016-11-26 MED ORDER — BUSPIRONE HCL 10 MG PO TABS: 10.0000 mg | ORAL_TABLET | Freq: Two times a day (BID) | ORAL | 1 refills | Status: AC

## 2016-11-26 MED ORDER — LAMOTRIGINE 25 MG PO TABS: 50.0000 mg | ORAL_TABLET | Freq: Every day | ORAL | 1 refills | Status: DC
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
## 2016-11-30 ENCOUNTER — Encounter: Payer: Self-pay | Admitting: Vascular Surgery

## 2016-12-01 ENCOUNTER — Ambulatory Visit (INDEPENDENT_AMBULATORY_CARE_PROVIDER_SITE_OTHER): Payer: Self-pay | Admitting: Vascular Surgery

## 2016-12-01 ENCOUNTER — Encounter: Payer: Self-pay | Admitting: Vascular Surgery

## 2016-12-01 VITALS — BP 141/87 | HR 89 | Temp 97.1°F | Resp 18 | Ht 71.0 in | Wt 252.0 lb

## 2016-12-01 DIAGNOSIS — I779 Disorder of arteries and arterioles, unspecified: Secondary | ICD-10-CM

## 2016-12-01 NOTE — Progress Notes (Signed)
Patient name: Oscar Hall MRN: [id number] DOB: Aug 31, 1960 Sex: male  REASON FOR VISIT: Hollow up right femoral endarterectomy and patch  HPI: Oscar Hall is a 56 y.o. male today for follow-up. He reports a episode of the swelling in both lower extremities. This was somewhat more so on the right than on the left which is complete resolved. He does report some numbness sensation in his medial thigh below the incision and I explained that this is normal after groin incision.  Current Outpatient Prescriptions  Medication Sig Dispense Refill  . albuterol (PROVENTIL HFA;VENTOLIN HFA) 108 (90 Base) MCG/ACT inhaler Inhale 2 puffs into the lungs every 6 (six) hours as needed for wheezing or shortness of breath. 3 Inhaler 3  . amitriptyline (ELAVIL) 50 MG tablet Take 1 tablet (50 mg total) by mouth at bedtime. 90 tablet 3  . busPIRone (BUSPAR) 10 MG tablet Take 1 tablet (10 mg total) by mouth 2 (two) times daily. 180 tablet 1  . ciprofloxacin (CIPRO) 500 MG tablet Take 1 tablet (500 mg total) by mouth 2 (two) times daily. 20 tablet 0  . clobetasol ointment (TEMOVATE) 0.05 % Apply to itching bumps twice a day as needed. Do not apply to face or groin.    Marland Kitchen lamoTRIgine (LAMICTAL) 25 MG tablet Take 2 tablets (50 mg total) by mouth daily. 180 tablet 1  . lidocaine (LIDODERM) 5 % Place 1 patch onto the skin daily. Remove & Discard patch within 12 hours or as directed by MD 30 patch 0  . lisinopril-hydrochlorothiazide (PRINZIDE,ZESTORETIC) 20-25 MG tablet Take 1 tablet by mouth daily. 90 tablet 3  . omeprazole (PRILOSEC) 40 MG capsule Take 1 capsule (40 mg total) by mouth daily. 90 capsule 3  . ondansetron (ZOFRAN ODT) 4 MG disintegrating tablet Take 1 tablet (4 mg total) by mouth every 8 (eight) hours as needed for nausea. 6 tablet 0  . oxyCODONE (OXY IR/ROXICODONE) 5 MG immediate release tablet Take 1 tablet (5 mg total) by mouth every 8 (eight) hours as needed. 10 tablet 0    . Oxycodone HCl 10 MG TABS Take 1 tablet (10 mg total) by mouth every 6 (six) hours as needed (severe pain). 20 tablet 0  . simvastatin (ZOCOR) 10 MG tablet Take 10 mg by mouth daily.    . simvastatin (ZOCOR) 20 MG tablet Take 1 tablet (20 mg total) by mouth at bedtime. 90 tablet 1  . tiotropium (SPIRIVA HANDIHALER) 18 MCG inhalation capsule Place 1 capsule (18 mcg total) into inhaler and inhale daily. 90 capsule 1   No current facility-administered medications for this visit.      PHYSICAL EXAM: Vitals:   12/01/16 0914  BP: (!) 141/87  Pulse: 89  Resp: 18  Temp: (!) 97.1 F (36.2 C)  TempSrc: Oral  SpO2: 97%  Weight: 252 lb (114.3 kg)  Height: 5\' 11"  (1.803 m)    GENERAL: The patient is a well-nourished male, in no acute distress. The vital signs are documented above. He has had complete resolution of any erythema and any swelling in his right groin. There is no fluctuance and not completely normal appearance. He does have a femoral pulse and has a 2+ dorsalis pedis pulses bilaterally  MEDICAL ISSUES: Stable follow-up after right femoral endarterectomy and bovine pericardial patch. We'll see him again in 6 months with ankle arm index. He has requested narcotic refill and I have not refill this and explained that he is far enough out from his surgery that should not  need narcotics for pain. He reports that he has had nothing for 6 weeks and is okay with this discussion. We'll see him in 6 months   Rosetta Posner, MD [facility] Tel [phone] Pager [phone]

## 2016-12-09 ENCOUNTER — Ambulatory Visit (INDEPENDENT_AMBULATORY_CARE_PROVIDER_SITE_OTHER): Payer: Medicaid Other | Admitting: Family Medicine

## 2016-12-09 ENCOUNTER — Encounter: Payer: Self-pay | Admitting: Family Medicine

## 2016-12-09 VITALS — BP 137/89 | HR 84 | Temp 96.8°F | Ht 71.0 in | Wt 256.0 lb

## 2016-12-09 DIAGNOSIS — I10 Essential (primary) hypertension: Secondary | ICD-10-CM

## 2016-12-09 DIAGNOSIS — M549 Dorsalgia, unspecified: Secondary | ICD-10-CM | POA: Diagnosis not present

## 2016-12-09 DIAGNOSIS — G8929 Other chronic pain: Secondary | ICD-10-CM | POA: Diagnosis not present

## 2016-12-09 DIAGNOSIS — L0291 Cutaneous abscess, unspecified: Secondary | ICD-10-CM

## 2016-12-09 MED ORDER — SULFAMETHOXAZOLE-TRIMETHOPRIM 800-160 MG PO TABS: 1.0000 | ORAL_TABLET | Freq: Two times a day (BID) | ORAL | 0 refills | Status: DC

## 2016-12-09 MED ORDER — ACETAMINOPHEN-CODEINE #3 300-30 MG PO TABS: 1.0000 | ORAL_TABLET | Freq: Three times a day (TID) | ORAL | 0 refills | Status: DC | PRN

## 2016-12-09 NOTE — Progress Notes (Signed)
BP 137/89   Pulse 84   Temp (!) 96.8 F (36 C) (Oral)   Ht 5\' 11"  (1.803 m)   Wt 256 lb (116.1 kg)   BMI 35.70 kg/m    Subjective:    Patient ID: Oscar Hall, male    DOB: 1960/08/31, 56 y.o.   MRN: [id number]  HPI: Oscar Hall is a 56 y.o. male presenting on 12/09/2016 for Boil on neck, underneath arm and Hypertension (hypotension since surgery on leg)   HPI Abscess on neck and underarm Patient comes in because he noticed an abscess under his neck and under his right arm is been there over the past few days. He says a lot of the neck has been exquisitely more painful for him. He denies any fevers or chills or body aches. He has not had any drainage out of the site. The longer his neck is the largest in the one under his right arm has just started. Both sides have redness and warmth but he does not have it anywhere else.  Hypertension Patient is currently on lisinopril-hydrochlorothiazide, and their blood pressure today is 137/89. Patient denies any lightheadedness or dizziness. Patient denies headaches, blurred vision, chest pains, shortness of breath, or weakness. Denies any side effects from medication and is content with current medication.   Chronic back pain Patient has chronic back pain from known issues there. He has had multiple treatments and surgeries in the past. He would like referral to pain management. He denies any pain radiating down either leg.  Relevant past medical, surgical, family and social history reviewed and updated as indicated. Interim medical history since our last visit reviewed. Allergies and medications reviewed and updated.  Review of Systems  Constitutional: Negative for chills and fever.  Respiratory: Negative for shortness of breath and wheezing.   Cardiovascular: Negative for chest pain and leg swelling.  Musculoskeletal: Positive for back pain. Negative for gait problem.  Skin: Positive for color change. Negative for rash.  All other  systems reviewed and are negative.  Per HPI unless specifically indicated above     Objective:    BP 137/89   Pulse 84   Temp (!) 96.8 F (36 C) (Oral)   Ht 5\' 11"  (1.803 m)   Wt 256 lb (116.1 kg)   BMI 35.70 kg/m   Wt Readings from Last 3 Encounters:  12/09/16 256 lb (116.1 kg)  12/01/16 252 lb (114.3 kg)  10/20/16 244 lb 8 oz (110.9 kg)    Physical Exam  Constitutional: He is oriented to person, place, and time. He appears well-developed and well-nourished. No distress.  Eyes: Conjunctivae are normal. No scleral icterus.  Neck: Neck supple. No thyromegaly present.  Cardiovascular: Normal rate, regular rhythm, normal heart sounds and intact distal pulses.   No murmur heard. Pulmonary/Chest: Effort normal and breath sounds normal. No respiratory distress. He has no wheezes.  Musculoskeletal: Normal range of motion. He exhibits tenderness (Low back pain, bilateral, negative straight leg raise). He exhibits no edema.  Neurological: He is alert and oriented to person, place, and time. Coordination normal.  Skin: Skin is warm and dry. Lesion [place] and a half centimeter area of erythema and induration and swelling on anterior aspect and neck) noted. No rash noted. He is not diaphoretic. There is erythema (Small pencil eraser size area of erythema with slight induration but no fluctuation on the right axilla).  Psychiatric: He has a normal mood and affect. His behavior is normal.  Nursing note and vitals  reviewed.   I&DOf neck abscess: Incision was made on anterior medial aspect of the lesion. Significant purulent drainage was exuded. Culture was taken. Cotton swab was used to probe the area and break apart any loculations.  Simple dressing was placed over top. Bleeding was minimal and patient tolerated procedure well.      Assessment & Plan:   Problem List Items Addressed This Visit      Cardiovascular and Mediastinum   Essential hypertension - Primary    Other Visit  Diagnoses    Abscess       Anterior neck   Relevant Medications   acetaminophen-codeine (TYLENOL #3) 300-30 MG tablet   sulfamethoxazole-trimethoprim (BACTRIM DS,SEPTRA DS) 800-160 MG tablet   Other Relevant Orders   Anaerobic and Aerobic Culture (Completed)   Chronic back pain, unspecified back location, unspecified back pain laterality       Relevant Medications   acetaminophen-codeine (TYLENOL #3) 300-30 MG tablet   Other Relevant Orders   Ambulatory referral to Pain Clinic    Monitor blood pressure for now, give a little bit of pain medication and Bactrim for abscess   Follow up plan: Return if symptoms worsen or fail to improve.  Counseling provided for all of the vaccine components No orders of the defined types were placed in this encounter.   [name], MD [facility] 12/09/2016, 2:35 PM

## 2016-12-15 LAB — ANAEROBIC AND AEROBIC CULTURE

## 2016-12-17 NOTE — Addendum Note (Signed)
Addended by: Lianne Cure A on: 12/17/2016 01:59 PM   Modules accepted: Orders

## 2016-12-29 ENCOUNTER — Other Ambulatory Visit: Payer: Self-pay | Admitting: Family Medicine

## 2016-12-29 DIAGNOSIS — E785 Hyperlipidemia, unspecified: Secondary | ICD-10-CM

## 2016-12-30 NOTE — Telephone Encounter (Signed)
Last lipid 10/24/15

## 2017-02-03 ENCOUNTER — Other Ambulatory Visit: Payer: Self-pay | Admitting: Family Medicine

## 2017-02-03 DIAGNOSIS — R1013 Epigastric pain: Secondary | ICD-10-CM

## 2017-03-10 ENCOUNTER — Other Ambulatory Visit: Payer: Self-pay | Admitting: Family Medicine

## 2017-03-11 ENCOUNTER — Telehealth: Payer: Self-pay | Admitting: Family Medicine

## 2017-03-11 NOTE — Telephone Encounter (Signed)
Results given.

## 2017-05-14 ENCOUNTER — Ambulatory Visit (HOSPITAL_COMMUNITY): Payer: Self-pay | Admitting: Psychiatry

## 2017-06-01 ENCOUNTER — Ambulatory Visit (HOSPITAL_COMMUNITY)
Admission: RE | Admit: 2017-06-01 | Discharge: 2017-06-01 | Disposition: A | Discharge status: Home / Self Care | Payer: Medicaid Other | Source: Ambulatory Visit | Attending: Vascular Surgery | Admitting: Vascular Surgery

## 2017-06-01 ENCOUNTER — Other Ambulatory Visit: Payer: Self-pay

## 2017-06-01 ENCOUNTER — Ambulatory Visit: Payer: Medicaid Other | Admitting: Vascular Surgery

## 2017-06-01 ENCOUNTER — Encounter: Payer: Self-pay | Admitting: Vascular Surgery

## 2017-06-01 VITALS — BP 123/76 | HR 89 | Temp 97.4°F | Resp 16 | Ht 71.0 in | Wt 255.0 lb

## 2017-06-01 DIAGNOSIS — I779 Disorder of arteries and arterioles, unspecified: Secondary | ICD-10-CM
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
## 2018-05-06 IMAGING — US US EXTREM LOW VENOUS*R*
1 series · 13 of 24 positions shown · non-contrast
Comparison: 10/25/2015 right lower extremity venous Doppler scan.

CLINICAL DATA: Worsening right calf pain.



[Series 1: us extrem low venous*right* · 0.09mm/px · 13 of 54 slices shown]
[im 1/54]
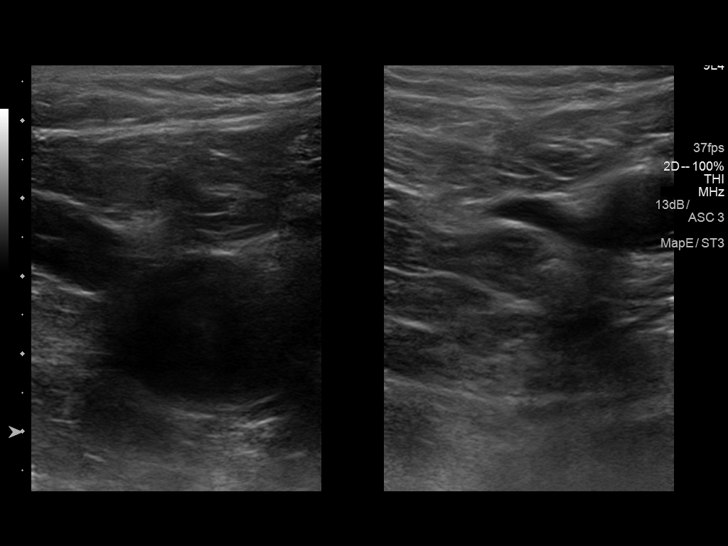
[im 5/54]
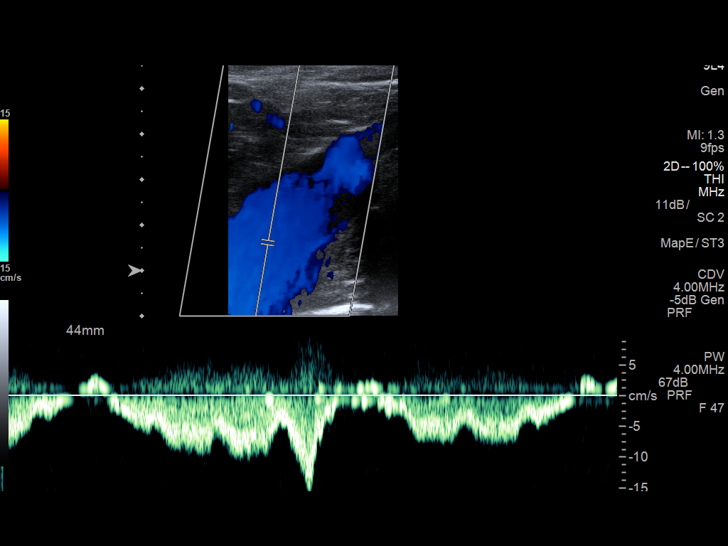
[im 10/54]
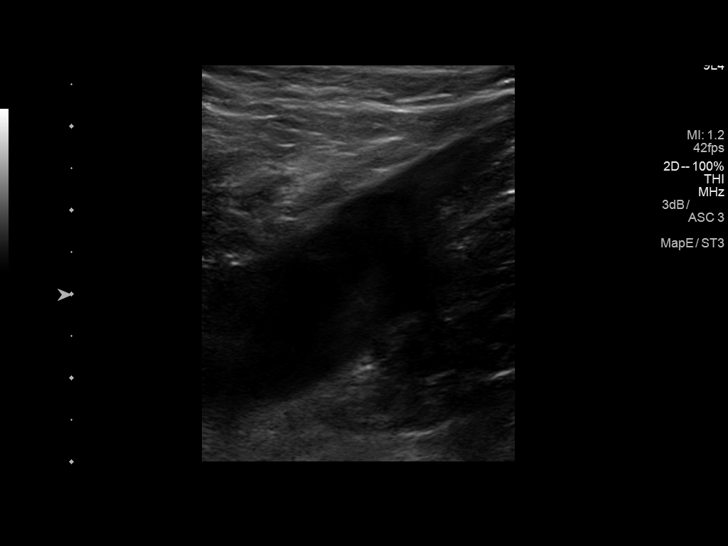
[im 14/54]
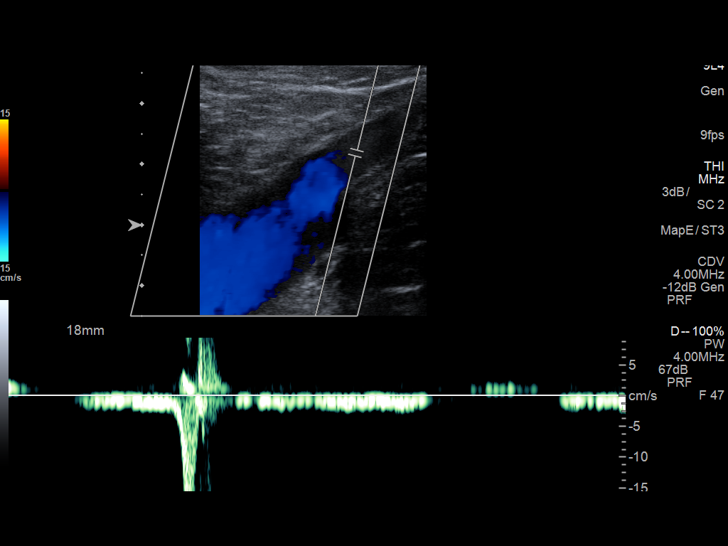
[im 19/54]
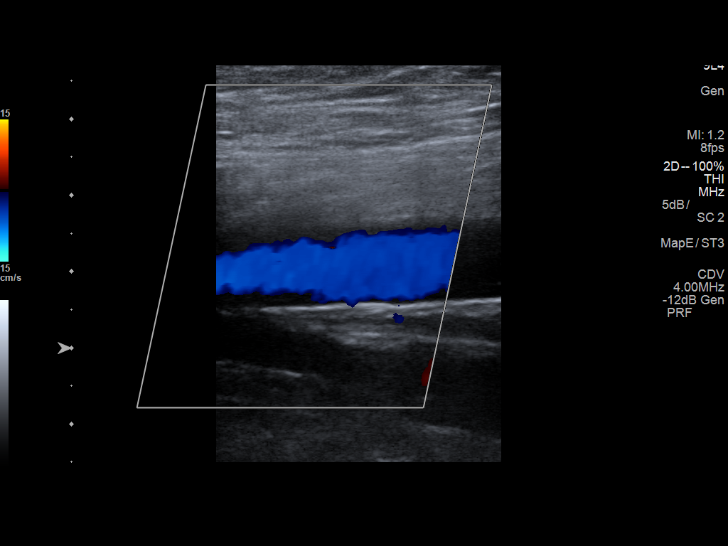
[im 24/54]
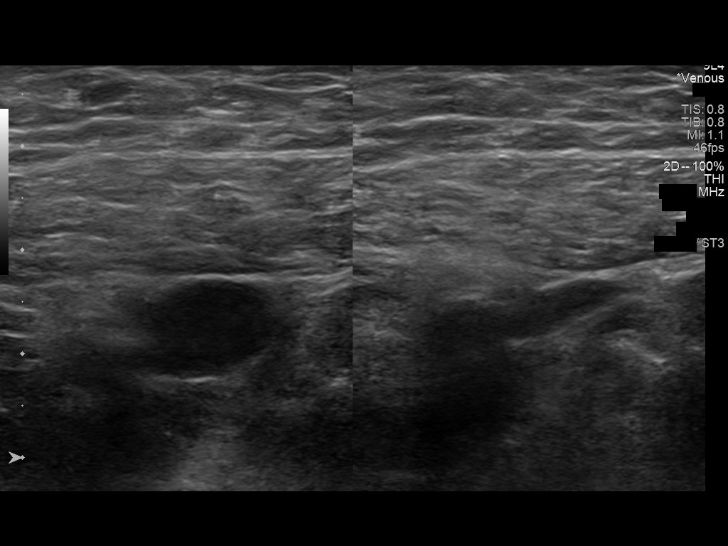
[im 28/54]
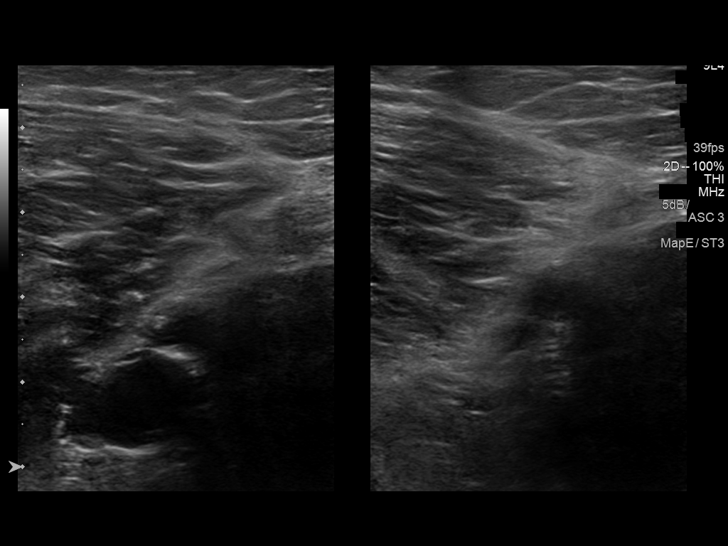
[im 30/54]
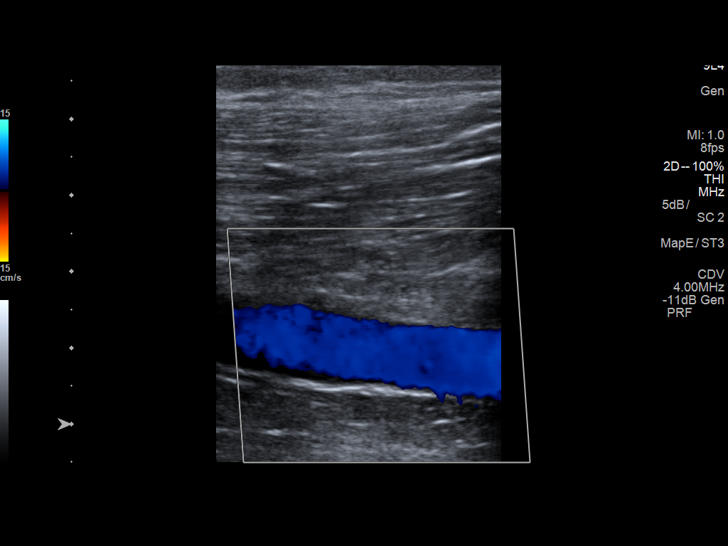
[im 35/54]
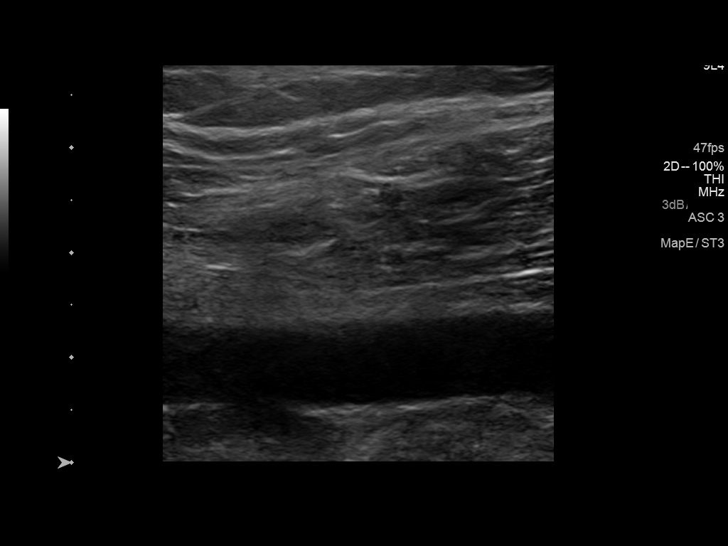
[im 40/54]
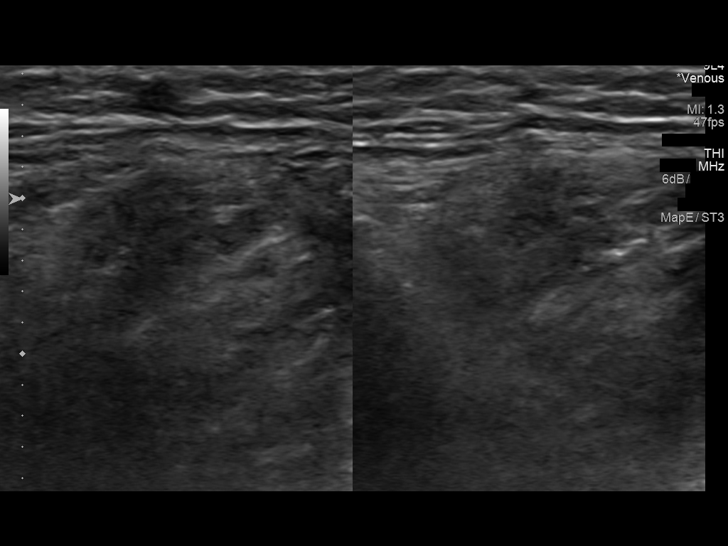
[im 44/54]
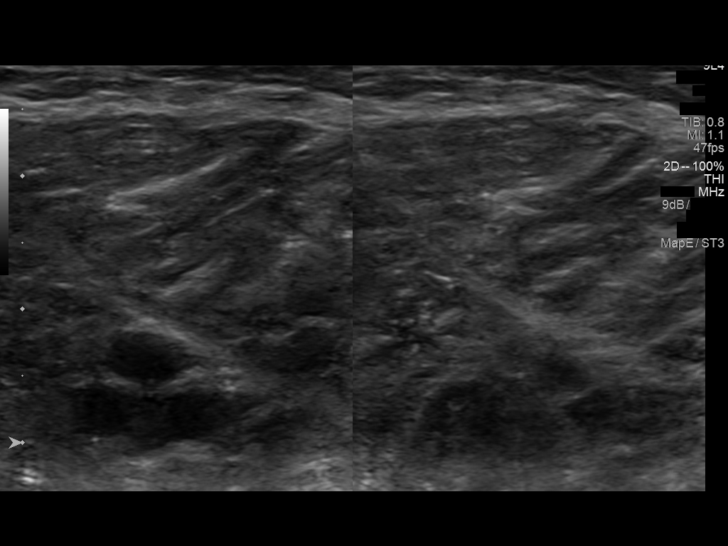
[im 49/54]
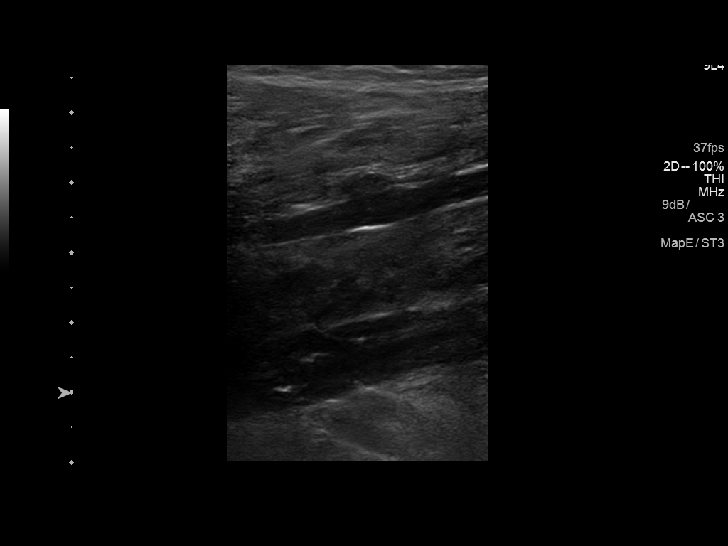
[im 54/54]
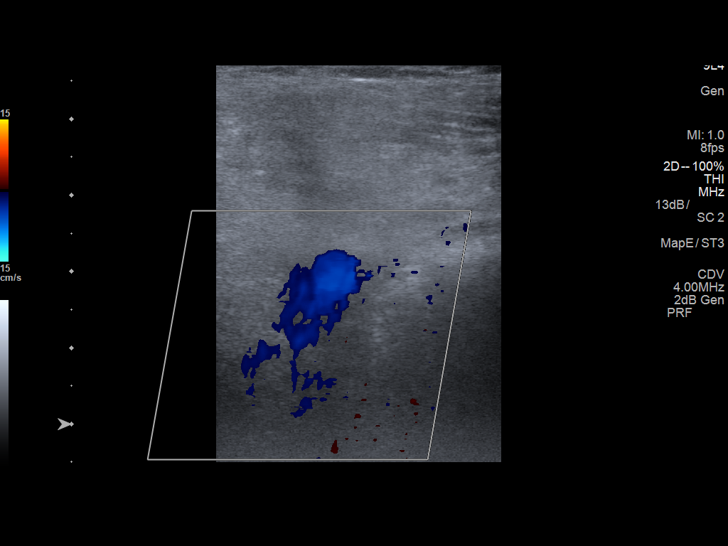

[13 of 24 positions shown; findings below may reference images not displayed]

FINDINGS: Contralateral Common Femoral Vein: Respiratory phasicity is normal
and symmetric with the symptomatic side. No evidence of thrombus.
Normal compressibility.

Common Femoral Vein: No evidence of thrombus. Normal
compressibility, respiratory phasicity and response to augmentation.

Saphenofemoral Junction: No evidence of thrombus. Normal
compressibility and flow on color Doppler imaging.

Profunda Femoral Vein: No evidence of thrombus. Normal
compressibility and flow on color Doppler imaging.

Femoral Vein: No evidence of thrombus. Normal compressibility,
respiratory phasicity and response to augmentation.

Popliteal Vein: No evidence of thrombus. Normal compressibility,
respiratory phasicity and response to augmentation.

Calf Veins: No evidence of thrombus. Normal compressibility and flow
on color Doppler imaging.

Superficial Great Saphenous Vein: No evidence of thrombus. Normal
compressibility and flow on color Doppler imaging.

Venous Reflux:  None.

Other Findings:  None.
IMPRESSION: No evidence of deep venous thrombosis in the right lower extremity.

## 2018-05-26 ENCOUNTER — Encounter: Payer: Self-pay | Admitting: Family Medicine

## 2018-05-26 ENCOUNTER — Ambulatory Visit: Payer: Medicaid Other | Admitting: Family Medicine

## 2018-05-26 VITALS — BP 127/78 | HR 97 | Temp 97.2°F | Ht 71.0 in | Wt 239.8 lb

## 2018-05-26 DIAGNOSIS — E782 Mixed hyperlipidemia: Secondary | ICD-10-CM

## 2018-05-26 DIAGNOSIS — J439 Emphysema, unspecified: Secondary | ICD-10-CM

## 2018-05-26 DIAGNOSIS — K2100 Gastro-esophageal reflux disease with esophagitis, without bleeding: Secondary | ICD-10-CM

## 2018-05-26 DIAGNOSIS — I10 Essential (primary) hypertension: Secondary | ICD-10-CM

## 2018-05-26 MED ORDER — SIMVASTATIN 20 MG PO TABS
20.0000 mg | ORAL_TABLET | Freq: Every day | ORAL | 3 refills | Status: AC
Start: 2018-05-26 — End: ?

## 2018-05-26 MED ORDER — TIOTROPIUM BROMIDE MONOHYDRATE 18 MCG IN CAPS: 18.0000 ug | ORAL_CAPSULE | Freq: Every day | RESPIRATORY_TRACT | 3 refills | Status: DC

## 2018-05-26 MED ORDER — AMITRIPTYLINE HCL 50 MG PO TABS: 50.0000 mg | ORAL_TABLET | Freq: Every day | ORAL | 3 refills | Status: AC

## 2018-05-26 MED ORDER — DEXLANSOPRAZOLE 60 MG PO CPDR: 60.0000 mg | DELAYED_RELEASE_CAPSULE | Freq: Every day | ORAL | 3 refills | Status: AC

## 2018-05-26 MED ORDER — ONDANSETRON 4 MG PO TBDP: 4.0000 mg | ORAL_TABLET | Freq: Three times a day (TID) | ORAL | 11 refills | Status: AC | PRN

## 2018-05-26 NOTE — Progress Notes (Signed)
BP 127/78   Pulse 97   Temp (!) 97.2 F (36.2 C) (Oral)   Ht '5\' 11"'  (1.803 m)   Wt 239 lb 12.8 oz (108.8 kg)   BMI 33.45 kg/m    Subjective:    Patient ID: Oscar Hall, male    DOB: 10-Mar-1961, 58 y.o.   MRN: [id number]  HPI: Oscar Hall is a 58 y.o. male presenting on 05/26/2018 for Hypertension (3 month follow up)   HPI Hypertension Patient is currently on no medications because he has not been taking his lisinopril hydrochlorothiazide, and their blood pressure today is 127/78. Patient denies any lightheadedness or dizziness. Patient denies headaches, blurred vision, chest pains, shortness of breath, or weakness. Denies any side effects from medication and is content with current medication.   GERD Patient is currently on omeprazole but he says the omeprazole gives him diarrhea so he would like to switch to Dexilant because he is tried samples of that and has done better on it.  She denies any major symptoms or abdominal pain or belching or burping. She denies any blood in her stool or lightheadedness or dizziness.   COPD Patient is coming in for COPD recheck today.  He is currently on Spiriva.  He has a mild chronic cough but denies any major coughing spells or wheezing spells.  He has 1nighttime symptoms per week and 1daytime symptoms per week currently.  Patient is currently smoking more than 2 packs/day which is up from where he was previously.  Hyperlipidemia Patient is coming in for recheck of his hyperlipidemia. The patient is currently taking simvastatin. They deny any issues with myalgias or history of liver damage from it. They deny any focal numbness or weakness or chest pain.   Relevant past medical, surgical, family and social history reviewed and updated as indicated. Interim medical history since our last visit reviewed. Allergies and medications reviewed and updated.  Review of Systems  Constitutional: Negative for chills and fever.  Eyes: Negative for visual  disturbance.  Respiratory: Positive for cough. Negative for shortness of breath and wheezing.   Cardiovascular: Negative for chest pain and leg swelling.  Gastrointestinal: Positive for abdominal pain and nausea. Negative for constipation, diarrhea and vomiting.  Musculoskeletal: Negative for back pain and gait problem.  Skin: Negative for rash.  Neurological: Negative for dizziness, weakness and light-headedness.  All other systems reviewed and are negative.   Per HPI unless specifically indicated above   Allergies as of 05/26/2018      Reactions   Penicillin G Shortness Of Breath   Penicillins Anaphylaxis, Swelling, Other (See Comments)   Has patient had a PCN reaction causing immediate rash, facial/tongue/throat swelling, SOB or lightheadedness with hypotension: Yes Has patient had a PCN reaction causing severe rash involving mucus membranes or skin necrosis: Yes Has patient had a PCN reaction that required hospitalization Yes Has patient had a PCN reaction occurring within the last 10 years: No If all of the above answers are "NO", then may proceed with Cephalosporin use.   Vistaril [hydroxyzine Hcl] Hives, Shortness Of Breath   Sulfa Antibiotics Hives, Itching   Aspirin Nausea And Vomiting, Other (See Comments)   Cannot take due to fatty liver   Ketorolac Other (See Comments)   G.I. Upset    Lithium Other (See Comments)   Increases blood pressure.   Rofecoxib Nausea And Vomiting   Sulfasalazine Hives, Itching   Tramadol Other (See Comments)   G.I. Upset  G.I. Upset    Tylenol [  acetaminophen] Nausea And Vomiting, Other (See Comments)   Cannot take due to fatty liver   Celebrex [celecoxib] Other (See Comments)   G.I. Upset G.I. Upset   Neurontin [gabapentin] Nausea And Vomiting, Rash   Nsaids Other (See Comments)   Upset stomach   Pregabalin Rash   Soma [carisoprodol] Other (See Comments)   G.I. Upset  G.I. Upset    Tolmetin Other (See Comments)   Upset stomach    Toradol [ketorolac Tromethamine] Other (See Comments)   G.IMariah Milling       Medication List       Accurate as of May 26, 2018 11:59 PM. Always use your most recent med list.        albuterol 108 (90 Base) MCG/ACT inhaler Commonly known as:  PROVENTIL HFA;VENTOLIN HFA Inhale 2 puffs into the lungs every 6 (six) hours as needed for wheezing or shortness of breath.   amitriptyline 50 MG tablet Commonly known as:  ELAVIL Take 1 tablet (50 mg total) by mouth at bedtime.   clobetasol ointment 0.05 % Commonly known as:  TEMOVATE Apply to itching bumps twice a day as needed. Do not apply to face or groin.   dexlansoprazole 60 MG capsule Commonly known as:  Dexilant Take 1 capsule (60 mg total) by mouth daily.   lamoTRIgine 25 MG tablet Commonly known as:  LAMICTAL Take 25 mg by mouth 2 (two) times daily.   ondansetron 4 MG disintegrating tablet Commonly known as:  Zofran ODT Take 1 tablet (4 mg total) by mouth every 8 (eight) hours as needed for nausea.   Oxycodone HCl 10 MG Tabs Take 1 tablet (10 mg total) by mouth every 6 (six) hours as needed (severe pain).   simvastatin 20 MG tablet Commonly known as:  ZOCOR Take 1 tablet (20 mg total) by mouth at bedtime.   tiotropium 18 MCG inhalation capsule Commonly known as:  Spiriva HandiHaler Place 1 capsule (18 mcg total) into inhaler and inhale daily.          Objective:    BP 127/78   Pulse 97   Temp (!) 97.2 F (36.2 C) (Oral)   Ht '5\' 11"'  (1.803 m)   Wt 239 lb 12.8 oz (108.8 kg)   BMI 33.45 kg/m   Wt Readings from Last 3 Encounters:  05/26/18 239 lb 12.8 oz (108.8 kg)  06/01/17 255 lb (115.7 kg)  12/09/16 256 lb (116.1 kg)    Physical Exam Vitals signs and nursing note reviewed.  Constitutional:      General: He is not in acute distress.    Appearance: He is well-developed. He is not diaphoretic.  Eyes:     General: No scleral icterus.    Conjunctiva/sclera: Conjunctivae normal.  Neck:      Musculoskeletal: Neck supple.     Thyroid: No thyromegaly.  Cardiovascular:     Rate and Rhythm: Normal rate and regular rhythm.     Heart sounds: Normal heart sounds. No murmur.  Pulmonary:     Effort: Pulmonary effort is normal. No respiratory distress.     Breath sounds: Normal breath sounds. No wheezing.  Musculoskeletal: Normal range of motion.  Lymphadenopathy:     Cervical: No cervical adenopathy.  Skin:    General: Skin is warm and dry.     Findings: No rash.  Neurological:     Mental Status: He is alert and oriented to person, place, and time.     Coordination: Coordination normal.  Psychiatric:  Behavior: Behavior normal.       Assessment & Plan:   Problem List Items Addressed This Visit      Cardiovascular and Mediastinum   Essential hypertension   Relevant Medications   simvastatin (ZOCOR) 20 MG tablet   Other Relevant Orders   CMP14+EGFR     Respiratory   COPD (chronic obstructive pulmonary disease) (HCC)     Digestive   GERD (gastroesophageal reflux disease) - Primary   Relevant Medications   ondansetron (ZOFRAN ODT) 4 MG disintegrating tablet   dexlansoprazole (DEXILANT) 60 MG capsule   Other Relevant Orders   CBC with Differential/Platelet    Other Visit Diagnoses    Mixed hyperlipidemia       Relevant Medications   simvastatin (ZOCOR) 20 MG tablet   Other Relevant Orders   Lipid panel      Switch patient to [place] and give some Zofran for stomach, continue simvastatin and blood pressure medication is currently. Follow up plan: Return in about 6 months (around 11/26/2018), or if symptoms worsen or fail to improve, for Hypertension and GERD.  Counseling provided for all of the vaccine components Orders Placed This Encounter  Procedures  . CBC with Differential/Platelet  . CMP14+EGFR  . Lipid panel    [name], MD [facility] 06/01/2018, 9:24 PM

## 2018-05-30 ENCOUNTER — Telehealth: Payer: Self-pay | Admitting: Family Medicine

## 2018-05-30 DIAGNOSIS — J439 Emphysema, unspecified: Secondary | ICD-10-CM

## 2018-05-30 MED ORDER — TIOTROPIUM BROMIDE MONOHYDRATE 18 MCG IN CAPS: 18.0000 ug | ORAL_CAPSULE | Freq: Every day | RESPIRATORY_TRACT | 3 refills | Status: AC

## 2018-05-30 NOTE — Telephone Encounter (Signed)
Rx sent - aware. 

## 2018-08-25 ENCOUNTER — Ambulatory Visit (INDEPENDENT_AMBULATORY_CARE_PROVIDER_SITE_OTHER): Payer: Medicaid Other | Admitting: Family Medicine

## 2018-08-25 ENCOUNTER — Encounter: Payer: Self-pay | Admitting: Family Medicine

## 2018-08-25 DIAGNOSIS — L02213 Cutaneous abscess of chest wall: Secondary | ICD-10-CM

## 2018-08-25 MED ORDER — DOXYCYCLINE HYCLATE 100 MG PO TABS: 100.0000 mg | ORAL_TABLET | Freq: Two times a day (BID) | ORAL | 0 refills | Status: AC
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
## 2018-12-02 ENCOUNTER — Other Ambulatory Visit: Payer: Self-pay | Admitting: Family Medicine

## 2019-01-23 ENCOUNTER — Telehealth: Payer: Self-pay | Admitting: Family Medicine

## 2019-02-21 NOTE — Telephone Encounter (Signed)
Patient was respiratory symptoms and passed away. Police is looking respiratory and maybe chf related and swelling and may be related to his COPD as well.  Passed away last night and EMS found him in rigor mortis

## 2019-02-21 DEATH — deceased
